# Patient Record
Sex: Female | Born: 1945
Health system: Southern US, Community
[De-identification: ages and names within clinical notes are randomized; demographics above are authoritative.]

## PROBLEM LIST (undated history)

## (undated) DIAGNOSIS — I5181 Takotsubo syndrome: Secondary | ICD-10-CM

## (undated) DIAGNOSIS — R011 Cardiac murmur, unspecified: Secondary | ICD-10-CM

## (undated) DIAGNOSIS — R7401 Elevation of levels of liver transaminase levels: Secondary | ICD-10-CM

## (undated) DIAGNOSIS — N3010 Interstitial cystitis (chronic) without hematuria: Secondary | ICD-10-CM

## (undated) DIAGNOSIS — B029 Zoster without complications: Secondary | ICD-10-CM

## (undated) DIAGNOSIS — R251 Tremor, unspecified: Secondary | ICD-10-CM

## (undated) DIAGNOSIS — M8580 Other specified disorders of bone density and structure, unspecified site: Secondary | ICD-10-CM

## (undated) DIAGNOSIS — K859 Acute pancreatitis without necrosis or infection, unspecified: Secondary | ICD-10-CM

## (undated) DIAGNOSIS — R7303 Prediabetes: Secondary | ICD-10-CM

## (undated) DIAGNOSIS — I959 Hypotension, unspecified: Secondary | ICD-10-CM

## (undated) DIAGNOSIS — I340 Nonrheumatic mitral (valve) insufficiency: Secondary | ICD-10-CM

## (undated) DIAGNOSIS — I4719 Other supraventricular tachycardia: Secondary | ICD-10-CM

## (undated) DIAGNOSIS — I341 Nonrheumatic mitral (valve) prolapse: Secondary | ICD-10-CM

## (undated) DIAGNOSIS — I471 Supraventricular tachycardia: Secondary | ICD-10-CM

## (undated) DIAGNOSIS — M810 Age-related osteoporosis without current pathological fracture: Secondary | ICD-10-CM

## (undated) DIAGNOSIS — R000 Tachycardia, unspecified: Secondary | ICD-10-CM

## (undated) DIAGNOSIS — J439 Emphysema, unspecified: Secondary | ICD-10-CM

## (undated) DIAGNOSIS — R740 Nonspecific elevation of levels of transaminase and lactic acid dehydrogenase [LDH]: Secondary | ICD-10-CM

## (undated) DIAGNOSIS — G43909 Migraine, unspecified, not intractable, without status migrainosus: Secondary | ICD-10-CM

## (undated) DIAGNOSIS — E785 Hyperlipidemia, unspecified: Secondary | ICD-10-CM

## (undated) HISTORY — DX: Hypotension, unspecified: I95.9

## (undated) HISTORY — PX: DILATION AND CURETTAGE OF UTERUS: SHX78

## (undated) HISTORY — DX: Nonrheumatic mitral (valve) prolapse: I34.1

## (undated) HISTORY — DX: Tachycardia, unspecified: R00.0

## (undated) HISTORY — DX: Age-related osteoporosis without current pathological fracture: M81.0

## (undated) HISTORY — DX: Nonrheumatic mitral (valve) insufficiency: I34.0

## (undated) HISTORY — DX: Interstitial cystitis (chronic) without hematuria: N30.10

## (undated) HISTORY — PX: WRIST SURGERY: SHX841

## (undated) HISTORY — PX: TOE SURGERY: SHX1073

## (undated) HISTORY — DX: Other specified disorders of bone density and structure, unspecified site: M85.80

## (undated) HISTORY — DX: Zoster without complications: B02.9

## (undated) HISTORY — PX: ELBOW SURGERY: SHX618

## (undated) HISTORY — DX: Cardiac murmur, unspecified: R01.1

## (undated) HISTORY — PX: TOTAL ABDOMINAL HYSTERECTOMY W/ BILATERAL SALPINGOOPHORECTOMY: SHX83

---

## 1998-10-15 ENCOUNTER — Encounter: Admission: RE | Admit: 1998-10-15 | Discharge: 1998-10-15 | Payer: Self-pay | Admitting: Obstetrics and Gynecology

## 1999-09-19 ENCOUNTER — Other Ambulatory Visit: Admission: RE | Admit: 1999-09-19 | Discharge: 1999-09-19 | Payer: Self-pay | Admitting: Obstetrics and Gynecology

## 1999-10-17 ENCOUNTER — Encounter: Payer: Self-pay | Admitting: Obstetrics and Gynecology

## 1999-10-17 ENCOUNTER — Encounter: Admission: RE | Admit: 1999-10-17 | Discharge: 1999-10-17 | Payer: Self-pay | Admitting: Obstetrics and Gynecology

## 2000-10-18 ENCOUNTER — Encounter: Payer: Self-pay | Admitting: Obstetrics and Gynecology

## 2000-10-18 ENCOUNTER — Encounter: Admission: RE | Admit: 2000-10-18 | Discharge: 2000-10-18 | Payer: Self-pay | Admitting: Obstetrics and Gynecology

## 2001-10-22 ENCOUNTER — Encounter: Admission: RE | Admit: 2001-10-22 | Discharge: 2001-10-22 | Payer: Self-pay | Admitting: Obstetrics and Gynecology

## 2001-10-22 ENCOUNTER — Encounter: Payer: Self-pay | Admitting: Obstetrics and Gynecology

## 2002-05-03 HISTORY — PX: SHOULDER SURGERY: SHX246

## 2002-11-21 ENCOUNTER — Encounter: Admission: RE | Admit: 2002-11-21 | Discharge: 2002-11-21 | Payer: Self-pay | Admitting: Obstetrics and Gynecology

## 2003-12-04 ENCOUNTER — Ambulatory Visit (HOSPITAL_COMMUNITY): Admission: RE | Admit: 2003-12-04 | Discharge: 2003-12-04 | Payer: Self-pay | Admitting: Obstetrics and Gynecology

## 2004-12-05 ENCOUNTER — Ambulatory Visit (HOSPITAL_COMMUNITY): Admission: RE | Admit: 2004-12-05 | Discharge: 2004-12-05 | Payer: Self-pay | Admitting: Obstetrics and Gynecology

## 2005-08-09 ENCOUNTER — Ambulatory Visit (HOSPITAL_COMMUNITY): Admission: RE | Admit: 2005-08-09 | Discharge: 2005-08-09 | Payer: Self-pay | Admitting: Obstetrics & Gynecology

## 2005-12-08 ENCOUNTER — Ambulatory Visit (HOSPITAL_COMMUNITY): Admission: RE | Admit: 2005-12-08 | Discharge: 2005-12-08 | Payer: Self-pay | Admitting: Obstetrics & Gynecology

## 2005-12-15 ENCOUNTER — Encounter: Admission: RE | Admit: 2005-12-15 | Discharge: 2005-12-15 | Payer: Self-pay | Admitting: Obstetrics & Gynecology

## 2006-12-21 ENCOUNTER — Ambulatory Visit (HOSPITAL_COMMUNITY): Admission: RE | Admit: 2006-12-21 | Discharge: 2006-12-21 | Payer: Self-pay | Admitting: Obstetrics & Gynecology

## 2007-12-23 ENCOUNTER — Ambulatory Visit (HOSPITAL_COMMUNITY): Admission: RE | Admit: 2007-12-23 | Discharge: 2007-12-23 | Payer: Self-pay | Admitting: *Deleted

## 2008-08-07 ENCOUNTER — Ambulatory Visit (HOSPITAL_COMMUNITY): Admission: RE | Admit: 2008-08-07 | Discharge: 2008-08-07 | Payer: Self-pay | Admitting: Obstetrics & Gynecology

## 2008-12-24 ENCOUNTER — Ambulatory Visit (HOSPITAL_COMMUNITY): Admission: RE | Admit: 2008-12-24 | Discharge: 2008-12-24 | Payer: Self-pay | Admitting: Obstetrics & Gynecology

## 2009-12-03 ENCOUNTER — Encounter (INDEPENDENT_AMBULATORY_CARE_PROVIDER_SITE_OTHER): Payer: Self-pay | Admitting: Cardiology

## 2009-12-03 ENCOUNTER — Ambulatory Visit (HOSPITAL_COMMUNITY)
Admission: RE | Admit: 2009-12-03 | Discharge: 2009-12-03 | Payer: Self-pay | Source: Home / Self Care | Admitting: Cardiology

## 2009-12-20 ENCOUNTER — Ambulatory Visit: Payer: Self-pay | Admitting: Thoracic Surgery (Cardiothoracic Vascular Surgery)

## 2009-12-28 ENCOUNTER — Ambulatory Visit (HOSPITAL_COMMUNITY)
Admission: RE | Admit: 2009-12-28 | Discharge: 2009-12-28 | Payer: Self-pay | Source: Home / Self Care | Attending: Obstetrics & Gynecology | Admitting: Obstetrics & Gynecology

## 2010-01-02 HISTORY — PX: MITRAL VALVE REPAIR: SHX2039

## 2010-01-17 ENCOUNTER — Ambulatory Visit
Admission: RE | Admit: 2010-01-17 | Discharge: 2010-01-17 | Payer: Self-pay | Source: Home / Self Care | Attending: Thoracic Surgery (Cardiothoracic Vascular Surgery) | Admitting: Thoracic Surgery (Cardiothoracic Vascular Surgery)

## 2010-01-19 ENCOUNTER — Ambulatory Visit
Admission: RE | Admit: 2010-01-19 | Payer: Self-pay | Source: Home / Self Care | Attending: Cardiology | Admitting: Cardiology

## 2010-01-21 NOTE — Procedures (Signed)
NAMESOPHIE, TAMEZ             ACCOUNT NO.:  [id number]  MEDICAL RECORD NO.:  [id number]          PATIENT TYPE:  OIB  LOCATION:  1961                         FACILITY:  [facility]  PHYSICIAN:  Jake Bathe, MD      DATE OF BIRTH:  10/12/1945  DATE OF PROCEDURE:  01/19/2010 DATE OF DISCHARGE:  01/19/2010                           CARDIAC CATHETERIZATION   Cardiac catheterization is being performed via the left groin as a preoperative workup to mitral valve repair due to severe mitral regurgitation.  She has no prior history of CAD.  PROCEDURE DETAILS:  Informed consent was obtained.  Risk of stroke, heart attack, death, renal impairment, arterial damage, bleeding were explained to the patient at length.  A 4-French sheath was inserted into the left femoral artery and a 7-French sheath was inserted to the left femoral vein after 1% lidocaine was used.  She tolerated the procedure well.  A Judkins left #4 and a no-torque Williams right catheter were used to selectively cannulate the coronary arteries and multiple views with hand injection of Omnipaque were obtained.  Angled pigtail was used to cross the left ventricle.  Hemodynamics were obtained in the left ventriculogram in the RAO position utilizing 30 mL of contrast was performed and aortogram utilizing 30 mL of contrast was performed in the descending aorta.  Right heart catheterization was also performed initially via a balloon-tipped Swan catheter and pressures were obtained.  Following the procedure, sheaths were removed and she tolerated the procedure very well.  FINDINGS: 1. Left main artery - patent, branches into the circumflex as well as     LAD.  No angiographically significant coronary artery disease. 2. Left anterior descending artery - two small diagonal vessels.  All     of her coronary arteries are relatively small in diameter and she     is quite short in stature.  No angiographically significant      disease. 3. Circumflex artery - gives rise to two significant obtuse marginal     branches.  No angiographically significant disease. 4. Right coronary artery gives rise to the posterior descending     artery.  No angiographically significant disease is present. 5. Left ventriculogram:  Mitral valve prolapse is noted.  Left     ventricular ejection fraction is normal at 65%.  There does appear     to be more significant basilar contraction than the remainder of     the left ventricle.  However, there are no significant wall motion     abnormalities present.  There is 4+ mitral regurgitation present     where the atrium opacifies within 1-2 beats equally with the left     ventricle.  The ascending aorta appears normal. 6. Aortogram:  The mid to distal descending aorta is quite small in     caliber following the superior mesenteric artery.  There does not     appear to be any renal artery stenosis.  There also does not appear     to be any atherosclerosis of her distal aorta or iliacs.  Just once     again, she is quite small  in caliber.  HEMODYNAMICS:  Aortic pressure 140/66 with a mean of 93, left ventricular pressure 140/65 with an end-diastolic pressure of 19 mmHg. Wedge was 7/3 with a mean of 3.  This recording in retrospect may have been over wedged; however, there is significant V-wave when compared to A-wave.  Pulmonary artery 14/6 with a mean of 10.  Pulmonary artery 19/6 with a mean of 12.  Right ventricle 19/0 with end-diastolic pressure of 4 mmHg.  Right atrial pressure 3/2 with a mean of 2.  Aortic saturation used for calculation was 90.  Pulmonary artery saturation was 69 throughout much of the case; however, she was satting in the upper 90s on pulse oximetry.  Her Fick cardiac output was 4.2 liters per minute. Cardiac index was 2.9.  This may be slightly underestimated due to aortic sat being 90.  IMPRESSION: 1. No angiographically significant coronary artery disease. 2.  Normal ascending descending aorta with a small-caliber vessel     following the renal arteries. 3. Normal left ventricular ejection fraction of 65%. 4. Severe mitral regurgitation 4+. 5. Normal right-sided heart pressures with no evidence of pulmonary     hypertension and accentuated V-wave noted on wedge pressure due to     mitral regurgitation. 6. Mitral valve prolapse noted.  We will proceed with surgery.     Jake Bathe, MD     MCS/MEDQ  D:  01/19/2010  T:  01/19/2010  Job:  [id number]  cc:   Salvatore Decent. Cornelius Moras, M.D.  Electronically Signed by Donato Schultz MD on 01/21/2010 07:07:42 AM

## 2010-01-23 ENCOUNTER — Encounter: Payer: Self-pay | Admitting: Obstetrics & Gynecology

## 2010-01-24 LAB — CBC
HCT: 42.5 % (ref 36.0–46.0)
Hemoglobin: 13.9 g/dL (ref 12.0–15.0)
MCH: 28.1 pg (ref 26.0–34.0)
MCHC: 32.7 g/dL (ref 30.0–36.0)
MCV: 86 fL (ref 78.0–100.0)
Platelets: 259 10*3/uL (ref 150–400)
RBC: 4.94 MIL/uL (ref 3.87–5.11)
WBC: 5.1 10*3/uL (ref 4.0–10.5)

## 2010-01-24 LAB — COMPREHENSIVE METABOLIC PANEL
ALT: 23 U/L (ref 0–35)
AST: 27 U/L (ref 0–37)
Albumin: 4.2 g/dL (ref 3.5–5.2)
Alkaline Phosphatase: 78 U/L (ref 39–117)
CO2: 21 mEq/L (ref 19–32)
Calcium: 10 mg/dL (ref 8.4–10.5)
Chloride: 107 mEq/L (ref 96–112)
Creatinine, Ser: 1.04 mg/dL (ref 0.4–1.2)
GFR calc Af Amer: >60 mL/min (ref >60)
GFR calc non Af Amer: 54 mL/min — ABNORMAL LOW (ref >60)
Glucose, Bld: 93 mg/dL (ref 70–99)
Potassium: 4.7 mEq/L (ref 3.5–5.1)
Sodium: 140 mEq/L (ref 135–145)
Total Bilirubin: 0.3 mg/dL (ref 0.3–1.2)
Total Protein: 6.8 g/dL (ref 6.0–8.3)

## 2010-01-24 LAB — BLOOD GAS, ARTERIAL
Bicarbonate: 23.5 mEq/L (ref 20.0–24.0)
Drawn by: 181601
FIO2: 0.21 %
Patient temperature: 98.6
TCO2: 24.4 mmol/L (ref 0–100)
pCO2 arterial: 28.9 mmHg — ABNORMAL LOW (ref 35.0–45.0)
pH, Arterial: 7.522 — ABNORMAL HIGH (ref 7.350–7.400)

## 2010-01-24 LAB — HEMOGLOBIN A1C
Hgb A1c MFr Bld: 5.6 % (ref <5.7)
Mean Plasma Glucose: 114 mg/dL (ref <117)

## 2010-01-24 LAB — POCT I-STAT 3, ART BLOOD GAS (G3+)
Acid-Base Excess: 2 mmol/L (ref 0.0–2.0)
Bicarbonate: 26.9 mEq/L — ABNORMAL HIGH (ref 20.0–24.0)
O2 Saturation: 90 %
TCO2: 28 mmol/L (ref 0–100)
pCO2 arterial: 44.1 mmHg (ref 35.0–45.0)
pO2, Arterial: 59 mmHg — ABNORMAL LOW (ref 80.0–100.0)

## 2010-01-24 LAB — SURGICAL PCR SCREEN
MRSA, PCR: NEGATIVE
Staphylococcus aureus: NEGATIVE

## 2010-01-24 LAB — POCT I-STAT 3, VENOUS BLOOD GAS (G3P V)
Bicarbonate: 26 mEq/L — ABNORMAL HIGH (ref 20.0–24.0)
O2 Saturation: 72 %
TCO2: 27 mmol/L (ref 0–100)
pH, Ven: 7.369 — ABNORMAL HIGH (ref 7.250–7.300)
pO2, Ven: 39 mmHg (ref 30.0–45.0)

## 2010-01-24 LAB — APTT: aPTT: 26 seconds (ref 24–37)

## 2010-01-24 LAB — URINALYSIS, ROUTINE W REFLEX MICROSCOPIC
Protein, ur: NEGATIVE mg/dL
Specific Gravity, Urine: 1.023 (ref 1.005–1.030)
Urine Glucose, Fasting: NEGATIVE mg/dL
Urobilinogen, UA: 0.2 mg/dL (ref 0.0–1.0)
pH: 8 (ref 5.0–8.0)

## 2010-01-24 LAB — PROTIME-INR
INR: 0.93 (ref 0.00–1.49)
Prothrombin Time: 12.7 seconds (ref 11.6–15.2)

## 2010-01-25 ENCOUNTER — Inpatient Hospital Stay (HOSPITAL_COMMUNITY)
Admission: RE | Admit: 2010-01-25 | Discharge: 2010-02-02 | DRG: 545 | Disposition: A | Discharge status: Home Health | Payer: BC Managed Care – PPO | Attending: Thoracic Surgery (Cardiothoracic Vascular Surgery) | Admitting: Thoracic Surgery (Cardiothoracic Vascular Surgery)

## 2010-01-25 ENCOUNTER — Encounter: Payer: Self-pay | Admitting: Thoracic Surgery (Cardiothoracic Vascular Surgery)

## 2010-01-25 DIAGNOSIS — Z79899 Other long term (current) drug therapy: Secondary | ICD-10-CM

## 2010-01-25 DIAGNOSIS — I059 Rheumatic mitral valve disease, unspecified: Principal | ICD-10-CM | POA: Diagnosis present

## 2010-01-25 DIAGNOSIS — E8779 Other fluid overload: Secondary | ICD-10-CM | POA: Diagnosis not present

## 2010-01-25 DIAGNOSIS — D62 Acute posthemorrhagic anemia: Secondary | ICD-10-CM | POA: Diagnosis not present

## 2010-01-25 DIAGNOSIS — I959 Hypotension, unspecified: Secondary | ICD-10-CM | POA: Diagnosis not present

## 2010-01-25 DIAGNOSIS — E875 Hyperkalemia: Secondary | ICD-10-CM | POA: Diagnosis not present

## 2010-01-25 DIAGNOSIS — D72829 Elevated white blood cell count, unspecified: Secondary | ICD-10-CM | POA: Diagnosis not present

## 2010-01-25 DIAGNOSIS — K219 Gastro-esophageal reflux disease without esophagitis: Secondary | ICD-10-CM | POA: Diagnosis not present

## 2010-01-25 DIAGNOSIS — D696 Thrombocytopenia, unspecified: Secondary | ICD-10-CM | POA: Diagnosis not present

## 2010-01-25 DIAGNOSIS — K859 Acute pancreatitis without necrosis or infection, unspecified: Secondary | ICD-10-CM | POA: Diagnosis not present

## 2010-01-26 LAB — CBC
HCT: 31.2 % — ABNORMAL LOW (ref 36.0–46.0)
HCT: 25.1 % — ABNORMAL LOW (ref 36.0–46.0)
HCT: 26.4 % — ABNORMAL LOW (ref 36.0–46.0)
Hemoglobin: 8.5 g/dL — ABNORMAL LOW (ref 12.0–15.0)
Hemoglobin: 8.5 g/dL — ABNORMAL LOW (ref 12.0–15.0)
Hemoglobin: 8.8 g/dL — ABNORMAL LOW (ref 12.0–15.0)
MCH: 28.7 pg (ref 26.0–34.0)
MCH: 28.7 pg (ref 26.0–34.0)
MCHC: 34 g/dL (ref 30.0–36.0)
MCHC: 33.3 g/dL (ref 30.0–36.0)
MCHC: 33.9 g/dL (ref 30.0–36.0)
MCHC: 33.3 g/dL (ref 30.0–36.0)
MCV: 84.6 fL (ref 78.0–100.0)
MCV: 86.1 fL (ref 78.0–100.0)
MCV: 86.8 fL (ref 78.0–100.0)
RBC: 2.9 MIL/uL — ABNORMAL LOW (ref 3.87–5.11)
RDW: 13.4 % (ref 11.5–15.5)
RDW: 14.1 % (ref 11.5–15.5)

## 2010-01-26 LAB — HEMOGLOBIN AND HEMATOCRIT, BLOOD
HCT: 23.7 % — ABNORMAL LOW (ref 36.0–46.0)
Hemoglobin: 8 g/dL — ABNORMAL LOW (ref 12.0–15.0)

## 2010-01-26 LAB — POCT I-STAT 4, (NA,K, GLUC, HGB,HCT)
Glucose, Bld: 99 mg/dL (ref 70–99)
Glucose, Bld: 81 mg/dL (ref 70–99)
Glucose, Bld: 104 mg/dL — ABNORMAL HIGH (ref 70–99)
Glucose, Bld: 107 mg/dL — ABNORMAL HIGH (ref 70–99)
HCT: 37 % (ref 36.0–46.0)
HCT: 33 % — ABNORMAL LOW (ref 36.0–46.0)
HCT: 22 % — ABNORMAL LOW (ref 36.0–46.0)
HCT: 31 % — ABNORMAL LOW (ref 36.0–46.0)
Hemoglobin: 8.5 g/dL — ABNORMAL LOW (ref 12.0–15.0)
Hemoglobin: 6.8 g/dL — CL (ref 12.0–15.0)
Hemoglobin: 10.5 g/dL — ABNORMAL LOW (ref 12.0–15.0)
Potassium: 4.1 mEq/L (ref 3.5–5.1)
Potassium: 4.1 mEq/L (ref 3.5–5.1)
Sodium: 139 mEq/L (ref 135–145)
Sodium: 131 mEq/L — ABNORMAL LOW (ref 135–145)
Sodium: 141 mEq/L (ref 135–145)

## 2010-01-26 LAB — GLUCOSE, CAPILLARY
Glucose-Capillary: 102 mg/dL — ABNORMAL HIGH (ref 70–99)
Glucose-Capillary: 121 mg/dL — ABNORMAL HIGH (ref 70–99)
Glucose-Capillary: 91 mg/dL (ref 70–99)
Glucose-Capillary: 109 mg/dL — ABNORMAL HIGH (ref 70–99)
Glucose-Capillary: 133 mg/dL — ABNORMAL HIGH (ref 70–99)
Glucose-Capillary: 145 mg/dL — ABNORMAL HIGH (ref 70–99)

## 2010-01-26 LAB — POCT I-STAT 3, ART BLOOD GAS (G3+)
Acid-base deficit: 1 mmol/L (ref 0.0–2.0)
Acid-base deficit: 4 mmol/L — ABNORMAL HIGH (ref 0.0–2.0)
Bicarbonate: 21.8 mEq/L (ref 20.0–24.0)
Bicarbonate: 20.1 mEq/L (ref 20.0–24.0)
O2 Saturation: 100 %
O2 Saturation: 99 %
O2 Saturation: 100 %
Patient temperature: 37
TCO2: 23 mmol/L (ref 0–100)
TCO2: 23 mmol/L (ref 0–100)
TCO2: 21 mmol/L (ref 0–100)
pH, Arterial: 7.428 — ABNORMAL HIGH (ref 7.350–7.400)
pO2, Arterial: 142 mmHg — ABNORMAL HIGH (ref 80.0–100.0)

## 2010-01-26 LAB — PLATELET COUNT: Platelets: 142 10*3/uL — ABNORMAL LOW (ref 150–400)

## 2010-01-26 LAB — POCT I-STAT, CHEM 8
BUN: 6 mg/dL (ref 6–23)
Creatinine, Ser: 0.7 mg/dL (ref 0.4–1.2)
Glucose, Bld: 150 mg/dL — ABNORMAL HIGH (ref 70–99)
Hemoglobin: 8.2 g/dL — ABNORMAL LOW (ref 12.0–15.0)
TCO2: 21 mmol/L (ref 0–100)

## 2010-01-26 LAB — BASIC METABOLIC PANEL
Calcium: 7 mg/dL — ABNORMAL LOW (ref 8.4–10.5)
GFR calc Af Amer: >60 mL/min (ref >60)
GFR calc non Af Amer: >60 mL/min (ref >60)
Sodium: 138 mEq/L (ref 135–145)

## 2010-01-26 LAB — PROTIME-INR: INR: 1.44 (ref 0.00–1.49)

## 2010-01-26 LAB — GLUCOSE, POCT (MANUAL RESULT ENTRY)
Glucose, Bld: 96 mg/dL (ref 70–99)
Operator id: 3408

## 2010-01-26 LAB — CREATININE, SERUM
GFR calc Af Amer: >60 mL/min (ref >60)
GFR calc non Af Amer: >60 mL/min (ref >60)

## 2010-01-26 LAB — MAGNESIUM: Magnesium: 3.8 mg/dL — ABNORMAL HIGH (ref 1.5–2.5)

## 2010-01-27 LAB — BASIC METABOLIC PANEL
BUN: 16 mg/dL (ref 6–23)
Calcium: 7.6 mg/dL — ABNORMAL LOW (ref 8.4–10.5)
GFR calc non Af Amer: 52 mL/min — ABNORMAL LOW (ref >60)
Glucose, Bld: 142 mg/dL — ABNORMAL HIGH (ref 70–99)
Potassium: 5.3 mEq/L — ABNORMAL HIGH (ref 3.5–5.1)

## 2010-01-27 LAB — PROTIME-INR
INR: 1.92 — ABNORMAL HIGH (ref 0.00–1.49)
Prothrombin Time: 22.1 seconds — ABNORMAL HIGH (ref 11.6–15.2)

## 2010-01-27 LAB — CBC
Hemoglobin: 9.2 g/dL — ABNORMAL LOW (ref 12.0–15.0)
MCHC: 32.5 g/dL (ref 30.0–36.0)
Platelets: 100 10*3/uL — ABNORMAL LOW (ref 150–400)
RBC: 3.22 MIL/uL — ABNORMAL LOW (ref 3.87–5.11)

## 2010-01-28 LAB — CBC
HCT: 25.5 % — ABNORMAL LOW (ref 36.0–46.0)
MCHC: 33.7 g/dL (ref 30.0–36.0)
Platelets: 97 10*3/uL — ABNORMAL LOW (ref 150–400)
RDW: 13.9 % (ref 11.5–15.5)
WBC: 13.7 10*3/uL — ABNORMAL HIGH (ref 4.0–10.5)

## 2010-01-28 LAB — BASIC METABOLIC PANEL
BUN: 23 mg/dL (ref 6–23)
Calcium: 8.3 mg/dL — ABNORMAL LOW (ref 8.4–10.5)
Creatinine, Ser: 1.01 mg/dL (ref 0.4–1.2)
GFR calc Af Amer: >60 mL/min (ref >60)
GFR calc non Af Amer: 56 mL/min — ABNORMAL LOW (ref >60)

## 2010-01-28 LAB — PROTIME-INR
INR: 2.36 — ABNORMAL HIGH (ref 0.00–1.49)
Prothrombin Time: 25.9 seconds — ABNORMAL HIGH (ref 11.6–15.2)

## 2010-01-28 NOTE — Op Note (Signed)
Helen Swanson, Helen Swanson             ACCOUNT NO.:  [id number]  MEDICAL RECORD NO.:  [id number]          PATIENT TYPE:  INP  LOCATION:  2307                         FACILITY:  [facility]  PHYSICIAN:  Salvatore Decent. Cornelius Moras, M.D. DATE OF BIRTH:  1945/08/12  DATE OF PROCEDURE:  01/25/2010 DATE OF DISCHARGE:                              OPERATIVE REPORT   PREOPERATIVE DIAGNOSIS:  Mitral valve prolapse with severe mitral regurgitation.  POSTOPERATIVE DIAGNOSIS:  Mitral valve prolapse with severe mitral regurgitation.  PROCEDURE:  Right miniature thoracotomy for mitral valve repair (complex valvuloplasty including quadrangular resection of posterior leaflet with sliding leaflet annuloplasty and 32-mm Sorin Memo 3D ring annuloplasty).  SURGEON:  Salvatore Decent. Cornelius Moras, MD.  ASSISTANT:  Doree Fudge, PA  ANESTHESIA:  Burna Forts, MD.  BRIEF CLINICAL NOTE:  The patient is a 65 year old female recently discovered to have a heart murmur on routine physical exam by her primary care physician, Dr. Corrie Mckusick.  The patient was referred to Dr. Donato Schultz and underwent 2-D echocardiogram demonstrating mitral valve prolapse with moderate-to-severe mitral regurgitation.  There is normal left ventricular function.  Transesophageal echocardiogram was performed confirming the presence of bileaflet prolapse of the mitral valve with severe mitral regurgitation.  Again, there was normal left ventricular size and function.  The patient claimed to be relatively asymptomatic.  She subsequently underwent a routine exercise stress test, but could only exercise up to 3 minutes on a standard Bruce protocol due to shortness of breath and fatigue.  Elective cardiac catheterization was performed demonstrating normal coronary artery anatomy with no significant coronary artery disease.  Catheterization also confirmed the presence of severe mitral regurgitation.  Large V- waves were seen on right heart  catheterization and injection of the left ventricle revealed rapid filling of the left atrium.  The patient has been counseled at length regarding the indications, risks, and potential benefits of surgical intervention for treatment of mitral regurgitation.  Alternative treatment strategies and surgical approaches have been discussed.  The patient understands and accepts all associated risks and desires to proceed as described.  OPERATIVE FINDINGS: 1. 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 2. Normal left ventricular systolic function. 3. No residual mitral regurgitation following successful mitral valve     repair. 4. Very small caliber right femoral artery requiring saphenous vein     patch angioplasty after decannulation at the end of surgery.  OPERATIVE NOTE IN DETAIL:  The patient was brought to the operating room on the above-mentioned date and central monitoring was established by the anesthesia team under the care and direction of Dr. Ernie Avena.  Specifically, a radial arterial line was placed.  A Cordis introducing sheath was placed by Dr. Jacklynn Bue in the left subclavian position.  Attempts at floating a Swan-Ganz pulmonary artery catheter were unsuccessful in the preoperative holding area and aborted.  The patient developed pain with attempts to pass a Swan-Ganz catheter.  The patient is brought to the operating room and placed in the supine position on the operating table.  General endotracheal anesthesia was induced uneventfully.  The patient  is initially intubated with a dual- lumen endotracheal tube.  A Foley catheter was placed.  After induction of general anesthesia, a Swan-Ganz catheter will easily float into the pulmonary artery without any difficulty or resistance.  The patient was placed in the supine position with a soft roll behind the right scapula and the neck gently extended and turned  towards the left.  The patient's right neck, chest, abdomen, both groins, and both lower extremities were prepared and draped in a sterile manner.  Baseline transesophageal echocardiogram was performed by Dr. Jacklynn Bue. This confirmed the presence of billowing bileaflet prolapse of the mitral valve with moderate-to-severe mitral regurgitation.  There is normal left ventricular systolic function.  No other significant abnormalities are noted.  A small incision was made in the right inguinal crease and the anterior surface of the right common femoral artery and the right common femoral vein were dissected through this incision.  The femoral artery is small caliber but otherwise normal in appearance and free of any atherosclerotic disease.  Single lung ventilation was begun.  A right miniature anterolateral thoracotomy incision was performed lateral to the right nipple and breast.  The pectoralis major muscle was retracted medially and completely preserved.  The right pleural space was entered through the third intercostal space.  There are few adhesions between the lung and the dome of the right hemidiaphragm which are divided with electrocautery.  A soft tissue retractor was placed.  Two 11-mm ports were placed through separate stab incisions in the inframammary crease. The right pleural space is insufflated continuously with carbon dioxide gas through the posterior port.  A pledgeted suture was placed in the dome of the right hemidiaphragm and retracted inferiorly to facilitate exposure.  A longitudinal incision was made in the pericardium 3 cm anterior to the phrenic nerve.  Of note, there is a moderate-to-large size hematoma appreciated in the superior anterior mediastinum surrounding the superior vena cava.  This was present and noted at the time of entry into the chest and is clearly fresh thrombus, presumably related to microperforation of the superior vena cava or a  small accessory vein that must have occurred at the time of initial left subclavian line placement and attempted passage of the Swan-Ganz catheter.  There is no sign of any active bleeding.  The patient is heparinized systemically.  Two concentric pursestring sutures are placed on the anterior surface of the right femoral artery. A pursestring suture was placed on the anterior surface of the right common femoral vein.  The right common femoral vein is cannulated with a Seldinger technique through the pursestring suture and a flexible guidewire was advanced up through the inferior vena cava through the right atrium into the superior vena cava using transesophageal echocardiogram for guidance.  The femoral vein was dilated with serial dilators and a 22-French long femoral venous cannula was advanced over the guidewire up through the right atrium until the tip of the cannula extends into the superior vena cava.  The femoral artery is now cannulated with a Seldinger technique and a flexible guidewire was advanced up until it can be visualized intraluminally in the descending thoracic aorta by transesophageal echocardiogram.  The femoral artery was dilated with serial dilators and cannulated with a 16-French femoral arterial cannula.  The right internal jugular vein was cannulated with Seldinger technique and a guidewire advanced into the right atrium.  The internal jugular vein was dilated with serial dilators and subsequent cannulated with a 14-French pediatric femoral venous cannula.  Cardiopulmonary bypass was begun.  Vacuum assist venous drainage was utilized.  Venous drainage and exposure are notably excellent.  A longitudinal incision in the pericardium was extended in both directions.  Silk traction sutures are placed on either side to facilitate exposure.  An antegrade cardioplegic cannula was placed directly in the proximal right anterolateral surface of the ascending thoracic  aorta.  A retrograde cardioplegic cannula is placed through the right atrium into the coronary sinus using transesophageal echocardiogram for guidance.  The patient is cooled to 28 degrees systemic temperature.  The aortic crossclamp was applied and cold blood cardioplegia is delivered initially in antegrade fashion through the aortic root.  The initial cardioplegic arrest was rapid with early diastolic arrest.  Supplemental cardioplegia was administered retrograde through the coronary sinus catheter.  Repeat doses of cardioplegia are administered intermittently throughout the entire crossclamp portion of the operation every 20 minutes to maintain completely flat electrocardiogram.  Myocardial protection was felt to be excellent.  A left atriotomy incision was performed posteriorly through the interatrial groove and continue partway across the back wall of the left atrium after opening the oblique sinus inferiorly.  A retractor blade was placed into the left atrium and attached to a sidearm placed through a small stab incision just to the right side of the sternum through the third intercostal space.  Exposure of the floor of the left atrium and the mitral valve were notably excellent.  The mitral valve was now carefully examined.  There was billowing bileaflet prolapse of valve with redundant excessive and somewhat thickened leaflet tissue, all consistent with Barlow syndrome.  There are no areas of ruptured chords or flail segments, but simply diffuse elongation.  The posterior leaflet was particularly tall in the midline and redundant.  There is some calcification beneath the P1 segment of the posterior leaflet, although this is not causing too much restriction at this level.  Valve repair is felt to be optimal.  Interrupted 2-0 Ethibond horizontal mattress sutures are placed circumferentially around the entire mitral valve annulus.  These sutures will ultimately be utilized for  ring annuloplasty; and at this juncture, they are utilized to suspend the valve symmetrically to facilitate further repair.  Because of the excessive height of the posterior leaflet with billowing and excessive tissue, quadrangular resection with sliding leaflet annuloplasty was felt that likely be the best choice for optimal long-term result.  The posterior leaflet was mobilized off of the posterior mitral annulus using an 11 blade knife.  A small quadrangular resection in the tallest portion of the middle scallop (P2) of the posterior leaflet is now resected.  Mobilizing the P1 segment of posterior leaflet off of the annulus is somewhat tricky due to the underlying calcification, but ultimately this was adequately freed up and the sliding plasty is continued well beyond the area of calcification.  There is adequate leaflet tissue in this region without need to patch augment as the calcium does not extend into the leaflet itself.  The sliding leaflet reconstruction was performed using a running closure of CV-5 Gore-Tex suture.  The remaining defect in the middle of the valve was also closed with CV-5 Gore-Tex.  After completion of the quadrangular resection with sliding leaflet plasty, the valve appears to be competent.  Of note, the vertical height of the middle portion of the posterior leaflet has now been reduced to less than 15 mm in height.  The valve was sized to accept a 32-mm annuloplasty ring which is slightly larger than  the dimensions of the anterior leaflet.  A Sorin Memo 3D annuloplasty ring (size 32 mm, catalog number [id number], serial number [id number]) is secured in place uneventfully.  After completion of the valve repair, the valve was again tested with saline and appears to be perfectly competent.  There is a broad symmetrical line of coaptation of the anterior and posterior leaflets.  The posterior leaflet height has been reduced nicely and is well below 15 mm in total  height.  Rewarming has begun.  A sump drain was placed across the mitral valve to serve as a left ventricular vent.  The left atriotomy incision was closed using a two- layer closure of running 3-0 Prolene suture.  One final dose of warm retrograde hot shot cardioplegia is administered.  The aortic crossclamp was removed after total crossclamp time of 125 minutes.  The heart began to beat spontaneously without need for cardioversion.  The retrograde cannula was removed.  Epicardial pacing wire was fixed to the undersurface of the right ventricular free wall into the right atrial appendage.  The patient rewarmed to 37 degrees centigrade temperature. The lungs were ventilated and the heart was allowed to fill after which time the left ventricular appendage removed.  The lungs were again ventilated and the heart allowed to fill and echocardiogram was briefly examined.  The valve repair appears to be intact.  At this juncture, the antegrade cannula was removed.  The patient is weaned from cardiopulmonary bypass without difficulty. The patient's rhythm at separation from bypass is a very slow junctional escape rhythm.  AV sequential pacing was employed.  Total cardiopulmonary bypass time for the operation is 179 minutes.  No inotropic support is required.  Followup transesophageal echocardiogram performed by Dr. Jacklynn Bue after separation from bypass demonstrates a well-seated annuloplasty ring in the mitral position.  The mitral valve is functioning normally with no residual mitral valve prolapse and no residual mitral regurgitation, whatsoever.  There is no sign of any systolic anterior motion of the mitral valve and there is a broad line of coaptation of the anterior- posterior leaflets below the annulus.  Left ventricular function is normal.  No other abnormalities are noted.  Protamine was administered to reverse the anticoagulation.  The femoral venous and arterial cannulae are  removed.  The right internal jugular cannula was removed and manual pressure was held on the neck for 13 minutes.  After tying the sutures on the femoral artery, the pulse in the distal femoral artery seems weak.  The femoral artery is notably quite small in caliber, and it is concerning that the pursestring closure of the anterior surface of the femoral artery may have caused stenosis at this level due to pursestring effect in a very small caliber vessel.  The patient was given 2000 units of intravenous heparin.  Proximal and distal control of the femoral artery is ascertained.  A portion of the adjacent greater saphenous vein is removed and trimmed into an elliptical patch.  The closure of the artery is now reopened and a longitudinal incision in the artery is made.  The artery is re-closed with patch angioplasty using saphenous vein with running 6-0 Prolene suture.  After removal of the clamps, the pulse in the distal femoral artery is noticeably improved.  Single lung ventilation was begun.  Meticulous surgical hemostasis is ascertained.  The pericardial sac is drained with a 28-French Bard drain placed through the anterior port incision.  The pericardium was closed with a patch of CorMatrix.  The right  pleural space is irrigated with saline solution and inspected for hemostasis.  The On-Q continuous pain management system is utilized to facilitate postoperative pain control. One 5-inch catheter supplied with the On-Q kit was tunneled initially to accept subcutaneous tissues through the posterior port incision and then tunneled through the intercostal space into the subpleural space posteriorly to cover the third through the sixth intercostal nerve roots.  The catheter was flushed with 5 mL of 0.5% bupivacaine solution and ultimately connected to continuous infusion pump.  The right pleural space was drained with a 28-French Bard chest tube placed through the posterior port  incision.  The mini thoracotomy was closed in multiple layers in routine fashion.  The right groin incision inspected for hemostasis, closed in multiple layers in routine fashion.  All skin incisions are closed with subcuticular skin closure.  Chest tubes were fixed to a closed suction drainage device.  The patient tolerated the procedure well.  The patient is reintubated with a single- lumen endotracheal tube and then transported to surgical intensive care unit in stable condition.  There are no intraoperative complications. All sponge, instrument, and needle counts were verified and correct at completion of the operation.  No blood products were administered.     Salvatore Decent. Cornelius Moras, M.D.     CHO/MEDQ  D:  01/25/2010  T:  01/26/2010  Job:  [id number]  cc:   Jake Bathe, MD Corrie Mckusick, M.D.  Electronically Signed by Tressie Stalker M.D. on 01/26/2010 08:08:02 AM

## 2010-01-29 LAB — TYPE AND SCREEN
ABO/RH(D): B POS
Antibody Screen: NEGATIVE
Unit division: 0
Unit division: 0

## 2010-01-29 LAB — COMPREHENSIVE METABOLIC PANEL
AST: 70 U/L — ABNORMAL HIGH (ref 0–37)
Albumin: 3.1 g/dL — ABNORMAL LOW (ref 3.5–5.2)
BUN: 17 mg/dL (ref 6–23)
Calcium: 8.4 mg/dL (ref 8.4–10.5)
Chloride: 99 mEq/L (ref 96–112)
Creatinine, Ser: 0.81 mg/dL (ref 0.4–1.2)
GFR calc Af Amer: >60 mL/min (ref >60)
GFR calc non Af Amer: >60 mL/min (ref >60)
Total Bilirubin: 0.3 mg/dL (ref 0.3–1.2)

## 2010-01-29 LAB — CBC
MCV: 85.5 fL (ref 78.0–100.0)
Platelets: 118 10*3/uL — ABNORMAL LOW (ref 150–400)
RBC: 3.1 MIL/uL — ABNORMAL LOW (ref 3.87–5.11)
RDW: 14.1 % (ref 11.5–15.5)
WBC: 13.2 10*3/uL — ABNORMAL HIGH (ref 4.0–10.5)

## 2010-01-29 LAB — AMYLASE: Amylase: 157 U/L — ABNORMAL HIGH (ref 0–105)

## 2010-01-29 LAB — PROTIME-INR: Prothrombin Time: 18.5 seconds — ABNORMAL HIGH (ref 11.6–15.2)

## 2010-01-30 LAB — PROTIME-INR
INR: 1.33 (ref 0.00–1.49)
Prothrombin Time: 16.7 seconds — ABNORMAL HIGH (ref 11.6–15.2)

## 2010-01-31 LAB — COMPREHENSIVE METABOLIC PANEL
BUN: 9 mg/dL (ref 6–23)
CO2: 26 mEq/L (ref 19–32)
Calcium: 8.5 mg/dL (ref 8.4–10.5)
Chloride: 102 mEq/L (ref 96–112)
Creatinine, Ser: 0.74 mg/dL (ref 0.4–1.2)
GFR calc Af Amer: >60 mL/min (ref >60)
GFR calc non Af Amer: >60 mL/min (ref >60)
Total Bilirubin: 0.5 mg/dL (ref 0.3–1.2)

## 2010-01-31 LAB — PROTIME-INR: Prothrombin Time: 18.1 seconds — ABNORMAL HIGH (ref 11.6–15.2)

## 2010-01-31 LAB — AMYLASE: Amylase: 165 U/L — ABNORMAL HIGH (ref 0–105)

## 2010-02-01 LAB — PROTIME-INR: INR: 1.48 (ref 0.00–1.49)

## 2010-02-01 LAB — AMYLASE: Amylase: 162 U/L — ABNORMAL HIGH (ref 0–105)

## 2010-02-02 LAB — PROTIME-INR
INR: 1.44 (ref 0.00–1.49)
Prothrombin Time: 17.7 seconds — ABNORMAL HIGH (ref 11.6–15.2)

## 2010-02-03 NOTE — Discharge Summary (Signed)
Helen Swanson, Helen Swanson             ACCOUNT NO.:  [id number]  MEDICAL RECORD NO.:  [id number]          PATIENT TYPE:  INP  LOCATION:  2027                         FACILITY:  [facility]  PHYSICIAN:  Salvatore Decent. Cornelius Moras, M.D. DATE OF BIRTH:  1945/09/06  DATE OF ADMISSION:  01/25/2010 DATE OF DISCHARGE:                              DISCHARGE SUMMARY   ADMITTING DIAGNOSES: 1. Severe mitral regurgitation. 2. History of recurrent urinary tract infections.  DISCHARGE DIAGNOSES: 1. Severe mitral regurgitation. 2. History of recurrent urinary tract infections. 3. Mild pancreatitis.  PROCEDURES: 1. Right miniature thoracotomy for mitral valve repair (complex     valvuloplasty including quadrangular resection of posterior leaflet     with sliding leaflet annuloplasty utilizing a 32-mm Sorin Memo 3D     ring annuloplasty by Dr. Cornelius Moras on January 25, 2010). 2. Intraoperative TEE done by Dr. Luiz Blare on January 26, 2010.  HISTORY OF PRESENT ILLNESS:  This is a 65 year old Caucasian female who was initially seen and evaluated by Dr. Cornelius Moras in the office on December 20, 2009 for severe mitral regurgitation.  According to medical records, the patient was found to have a heart murmur on routine physical exam by her primary care physician, Dr. Leda Quail.  She was referred to Dr. Anne Fu for further evaluation.  She underwent a 2-D echocardiogram on November 11, 2009.  Results revealed the presence of normal left ventricular size and function, bileaflet prolapse of the mitral valve with moderate-to-severe MR, mild TR, left ventricular function was hyperdynamic with an EF of 65-70%, and no other abnormalities were noted.  The patient then underwent a transesophageal echocardiogram by Dr. Anne Fu on December 03, 2009.  Again, this confirmed the presence of severe MR with bileaflet prolapse of the mitral valve, normal left ventricular size and function and again no other abnormalities noted. She then  underwent an exercise stress test.  Apparently, she is only exercise up to 3 minutes of steroid protocol and then had to stop secondary to fatigue, shortness of breath, and tightness in her legs. She was then seen and evaluated by Dr. Cornelius Moras for consideration of a right mini-thoracotomy for mitral valve repair versus replacement.  However, prior to undergoing this aforementioned surgery, a cardiac catheterization needed to be obtained.  This was done by Dr. Anne Fu on January 19, 2010.  Results showed no significant coronary artery disease, normal ascending and descending aorta, left ventricular ejection fraction 55%, severe MR 4+ (as well as mitral valve prolapse). Potential risks, complications and benefits of the surgery were discussed at length with the patient.  She ultimately agreed to proceed. She was admitted to [facility] on January 25, 2010, to undergo the aforementioned right miniature thoracotomy for mitral valve repair by Dr. Cornelius Moras.  BRIEF HOSPITAL COURSE STAY:  The patient was extubated without difficulty early the evening of surgery.  She initially was AAI paced, she had a low-grade fever of 99.9, but later became afebrile and her vital signs were stable.  S[place]-G[place] A-line, chest tube and Foley were all removed early in her postoperative course.  The patient was surgically stable for discharge from the Intensive Care Unit to [facility]  for further convalescence on January 26, 2010.  She was started on a low- dose beta-blocker.  She was found to have acute blood loss anemia.  Her H and H went as low as 8.6 and 25.5.  She did not require a postoperative transfusion.  Her last H and H was 8.9 and 26.5 respectively.  She was started on low-dose Coumadin and her PT and INR were monitored carefully.  She was found to be volume overloaded and diuresed accordingly.  She did have some hypotension on postoperative day #2, her Lopressor was held.  Also, she began to have intermittent nausea.   As a result, all her p.o. meds were stopped with the exception of Protonix.  In addition, her systolic blood pressure did improve and Lopressor was resumed.  Her pacing wires were removed on postop day #3. Amylase was checked, which was found to be 157 initially, it did go up to 165; however, her last amylase was down to 162.  Her Protonix was increased from 40 mg p.o. once a day to twice a day for apparently worsening reflux.  She was also started on Reglan.  It should be noted the patient did not have any abdominal pain and was having bowel movement.  She did have an abdominal ultrasound on January 31, 2010, which essentially showed no gallbladder wall thickening, no pericholecystic fluid, no sludge, and no gallstones.  However, there was an area of echotexture in the uncinate process and posterior aspect of the head of the pancreas that was slightly hypoechoic compared to the rest of the pancreatic parenchyma.  There was no dilatation of the pancreatic duct, there was no pseudocyst or pancreatic enlargement seen. However, radiologist did recommend a CAT scan of the abdomen to further evaluate the pancreas.  This has been ordered and results are currently pending.  The patient's nausea did eventually resolve; however, she did still have some complaints of some indigestion; however, her appetite is steadily improving.  Currently on postop day #7, she is afebrile, heart rates in the 90s, blood pressure 141/80, O2 sat 96-97% on room air. Preop weight is 46 kg, today's weight down to 45.9 kg.  PHYSICAL EXAMINATION:  CARDIOVASCULAR:  Regular rate and rhythm.  No murmur. PULMONARY:  Clear. ABDOMEN:  Soft, nontender.  Bowel sounds present. EXTREMITIES:  No lower extremity edema. WOUND CARE:  Right chest wound is clean, dry and well healed.  Provided the patient's amylase is decreased, she continues to tolerate her diet without any more episodes of nausea and pending morning round of  evaluation, she will be surgically stable for discharge on February 02, 2010.  Latest laboratory results are as follows:  Amylase done on February 01, 2010 162.  PT and INR 18.1 and 1.4 respectively.  CMP done on January 31, 2010, potassium 4, sodium 138, BUN and creatinine 9 and 0.74 respectively.  AST 49, ALT 89.  Last chest x-ray done on January 30, 2010, showed a tiny right apical pneumothorax, small bilateral pleural effusions, stable cardiomegaly.  Discharge instructions include the following:  DIET:  Low-sodium, heart-healthy.  ACTIVITY:  No driving, no strenuous activity.  No lifting more than 10 pounds, until instructed otherwise.  She is to continue with her breathing exercise daily.  She is to walk daily and increase her frequency and duration as tolerates.  WOUND CARE:  She may shower.  She is to cleanse her wounds with mild soap and water.  She is to call the office if any wound problems  arise.  FOLLOWUP APPOINTMENTS: 1. The patient has an appointment to see Dr. Cornelius Moras on February 07, 2010     at 3 p.m. and 30 minutes prior to this office appointment, a chest     x-ray will be obtained. 2. The patient needs to contact Dr. Anne Fu' office for followup     appointment in 2 weeks. 3. The patient needs to contact Dr. Anne Fu' office for a PT and INR to     be drawn on Friday, February 04, 2010 at 9:30 a.m.  DISCHARGE MEDICATION AT THE TIME OF DICTATION: 1. Enteric-coated aspirin 81 mg p.o. daily. 2. Lopressor 12.5 p.o. two times daily. 3. Protonix 40 mg p.o. two times daily. 4. Reglan 10 mg p.o. three times daily for three days. 5. Ultram 50 mg one to two tablets every 6 hours p.r.n. pain. 6. Coumadin 2.5 mg p.o. every evening or as directed by Dr. Anne Fu'     office. 7. Bactrim 400/80 one tablet p.o. every other day. 8. Multivitamin p.o. daily. 9. Calcium carbonate/vitamin D p.o. daily. Patient was not placed on an Ace Inhibitor as has a preserved EF and blood pressure  well controlled with Beta Blocker.     Doree Fudge, PA   ______________________________ Salvatore Decent Cornelius Moras, M.D.    DZ/MEDQ  D:  02/01/2010  T:  02/02/2010  Job:  [id number]  cc:   Jake Bathe, MD M. Leda Quail, MD  Electronically Signed by Doree Fudge PA on 02/02/2010 09:38:04 AM Electronically Signed by Tressie Stalker M.D. on 02/03/2010 07:45:00 AM

## 2010-02-04 ENCOUNTER — Other Ambulatory Visit: Payer: Self-pay | Admitting: Thoracic Surgery (Cardiothoracic Vascular Surgery)

## 2010-02-04 DIAGNOSIS — I050 Rheumatic mitral stenosis: Secondary | ICD-10-CM

## 2010-02-07 ENCOUNTER — Ambulatory Visit
Admission: RE | Admit: 2010-02-07 | Discharge: 2010-02-07 | Disposition: A | Discharge status: Home / Self Care | Payer: BC Managed Care – PPO | Source: Ambulatory Visit | Attending: Thoracic Surgery (Cardiothoracic Vascular Surgery) | Admitting: Thoracic Surgery (Cardiothoracic Vascular Surgery)

## 2010-02-07 ENCOUNTER — Encounter (INDEPENDENT_AMBULATORY_CARE_PROVIDER_SITE_OTHER): Payer: BC Managed Care – PPO | Admitting: Thoracic Surgery (Cardiothoracic Vascular Surgery)

## 2010-02-07 DIAGNOSIS — I059 Rheumatic mitral valve disease, unspecified: Secondary | ICD-10-CM

## 2010-02-07 DIAGNOSIS — I050 Rheumatic mitral stenosis: Secondary | ICD-10-CM

## 2010-02-11 NOTE — Assessment & Plan Note (Signed)
OFFICE VISIT  Helen Swanson, Helen Swanson A DOB:  [birth year]                                        February 07, 2010 CHART #:  [id number]  HISTORY OF PRESENT ILLNESS:  Ms. Demeritt returns for routine followup status post right miniature thoracotomy for mitral valve repair on January 25, 2010.  Her early postoperative recovery was notable for the development of somewhat protracted postoperative nausea that took several days to resolve.  Serum amylase and lipase were borderline elevated.  Abdominal ultrasound was unremarkable, although raise question about the head of the pancreas.  Followup CT scan of the abdomen did not reveal any definite pathology, although there was mention made of a vague area of low attenuation in the uncinate process of the pancreas that could potentially have been related to a focal area of pancreatitis.  During this period of time of diagnostic workup while she remained in the hospital, her symptoms all completely resolved and she was discharged home last week.  Since then she has continued to do very well.  She states that she is eating fine and she has never had any more problems with nausea or vomiting.  She states that now she is eating more than she ever did prior to surgery.  She is not having any shortness of breath.  She has mild residual soreness in her chest which really only bothers her when she tries to sleep at night.  She states that she still cannot quite get comfortable lying in bed and she has been sleeping in a recliner.  She takes 1 pain pill at bedtime and this seems to help her.  When she is up and around during the day, she is not having any problems with pain and she is not taking any other pain relievers.  Her breathing is quite good.  She has not had any tachy palpitations.  Exercise tolerance continues to improve every day. Overall, she has no complaints.  PHYSICAL EXAMINATION:  GENERAL:  Well-appearing female. VITAL  SIGNS:  Blood pressure 117/79, pulse 100 and regular, oxygen saturation 97% on room air. CHEST:  Mini thoracotomy incision that is healing nicely.  The chest tube sutures are removed.  Auscultation reveals clear breath sounds that are symmetrical bilaterally.  No wheezes, rales, or rhonchi are noted. CARDIOVASCULAR:  Regular rate and rhythm.  No murmurs, rubs, or gallops are noted. ABDOMEN:  Soft, nontender.  Bowel sounds are present. EXTREMITIES:  Warm and well perfused.  The small incision in the right groin is healing nicely.  There is no lower extremity edema.  Pulses are palpable.  The remainder of her physical exam is noncontributory.  DIAGNOSTIC TEST:  Chest x-ray performed today at the [facility] is reviewed.  This demonstrates clear lung fields bilaterally with further clearing in the mild right lower lobe atelectasis and associated small right pleural effusion, which has essentially completely resolved at this time.  There still may be some atelectasis of the right middle lobe and/or fluid in the fissure with some opacity noted medially in the right lung base, but otherwise the lung fields have improved.  No other abnormalities are noted.  IMPRESSION:  Excellent progress following recent mitral valve repair.  PLAN:  I have instructed Ms. Sonnier to go ahead and stop taking aspirin as she probably does not need to be on both aspirin  and Coumadin at this time.  I have also suggested that she should go ahead and stop taking Reglan, which she was prescribed when she first went home because of previous nausea and vomiting.  I have encouraged her to continue to gradually increase her physical activity as tolerated with her only significant limitation at this point remaining that she refrain from heavy lifting or strenuous use of her arms or shoulders.  I think that within the next week if she continues to improve, she can probably start driving an automobile  presuming that she is not having any problems with significant pain, dizziness, or shortness of breath.  Overall, she looks good and seems to be recovering nicely.  We will plan to see her back in 4 weeks for routine followup.  Salvatore Decent. Cornelius Moras, M.D. Electronically Signed  CHO/MEDQ  D:  02/07/2010  T:  02/08/2010  Job:  [id number]  cc:   Jake Bathe, MD M. Leda Quail, MD

## 2010-03-02 NOTE — Consult Note (Signed)
  NAMEKEEGHAN, MCINTIRE             ACCOUNT NO.:  [id number]  MEDICAL RECORD NO.:  [id number]          PATIENT TYPE:  INP  LOCATION:  2307                         FACILITY:  [facility]  PHYSICIAN:  Burna Forts, M.D.DATE OF BIRTH:  29-Apr-1945  DATE OF CONSULTATION:  01/25/2010 DATE OF DISCHARGE:                                CONSULTATION   PROCEDURE:  Left internal jugular central vein cannulation.  The patient was brought to the holding area on the morning of surgery where under local anesthesia with sedation, the left internal jugular vein was sterilely prepped and draped with full barrier precautions. Local anesthesia is used.  The tunnel site again is used to identify the vein location.  The vein is accessed with a small venous stick towards the wire passed.  There was then some small obstruction noted and the wire was withdrawn.  The patient's area of the cannula itself was slightly removed back and was repassed.  The wire again was passed, again there were some slight obstruction with this wire passage, we then abandoned that procedure moving to lower down the neck again, was able to cannulate, but was apparently at the left IJ.  However, there were some mild obstruction to passage of the wire.  We therefore did not pass the  larger cannula and introducer into the left IJ.  We then sterilely prepped and draped the left subclavian area under the clavicle.  Local anesthesia again was used.  The left subclavian vein was easily accessed with a stick of needle.  Wire then was passed with only moderate difficulty, and then the introducer was placed over the wire with good positive heme flow.  There was additional difficulty passing the PA catheter at this time in this patient, we like to then to try to pass the pulmonary artery catheter at a later time  during the surgery once the patient was asleep and in position.  Of note, during the operative procedure, it was noted that  within the chest, there was a small superior hematoma appreciated just below the venous area of the innominate and the left internal jugular junction. This is believed to be a small puncture that was created by attempted passage of the wire several times.  This blood was subsequently cleaned out the suction by the surgeon himself.  There did not appear to be any significant further bleeding in that area and the operative procedure was planned to be carried out, which was the minimally invasive repair of mitral valve.          ______________________________ Burna Forts, M.D.     JTM/MEDQ  D:  01/25/2010  T:  01/26/2010  Job:  [id number]  Electronically Signed by Ester Rink M.D. on 03/02/2010 05:58:46 AM

## 2010-03-02 NOTE — Op Note (Signed)
Helen Swanson, Helen Swanson             ACCOUNT NO.:  [id number]  MEDICAL RECORD NO.:  [id number]           PATIENT TYPE:  LOCATION:                                 FACILITY:  PHYSICIAN:  Burna Forts, M.D.DATE OF BIRTH:  01-27-1945  DATE OF PROCEDURE:  01/27/2010 DATE OF DISCHARGE:                              OPERATIVE REPORT   Intraoperative transesophageal echocardiographic report.  INDICATIONS FOR PROCEDURE:  Helen Swanson is a 65 year old female who presents today for mitral valve repair or replacement by Dr. Tressie Stalker.  She is brought to the holding area on the morning of surgery where under local anesthesia and sedation, pulmonary arterial lines were placed, then taken to OR for routine induction of general anesthesia after which double-lumen tube was placed and then the TEE probe was prepared and placed oropharyngeally into the stomach and slightly withdrawn for imaging of the cardiac structures.  PRECARDIOPULMONARY BYPASS EXAMINATION: 1. Left ventricle.  The left ventricular chamber is initially seen in     a four-chamber view.  Overall, this is a large chamber by volume     with normal contractile pattern appreciated.  Short axis view again     shows chamber size is fact increased.  There is satisfactory to     good overall contractility in all segmental wall areas that are     examined.  There are no masses noted within the left ventricular     chamber itself. 2. Mitral valve.  The mitral valve initially seen in this four-chamber     view reveals a significantly thickened degenerative and billing     apparatus with both leaflets billing above the level of the normal     coaptation point well into the left atrial chamber.  Multiple views     were obtained.  There was a knuckle of the posterior leaflet which     appears well into the left atrial chamber size.  There are no flail     segments appreciated; however, there are bilateral leaflet of     prolapse without  flail into the left atrial chamber.  Color Doppler     reveals that there are several jets throughout the distance of the     P1, 2, and 3 leaflets overall though it does not appear significant     or severe.  Multiple jets do exist and there is an appearance of     mitral regurgitant flow well into the left atrial chamber.  This     would be characteristic of at least moderate-to-severe mitral     regurgitant flow. 3. Left atrium.  The left atrial chamber is itself interrogated in     view.  There is an enlarged chamber itself.  The septum is intact.     There is a large appendage.  There are no masses noted within the     appendage either. 4. Right ventricle.  Enlarged right ventricular chamber is     appreciated, though good vigorous contractility exists. 5. Tricuspid valve then compliant, mobile tricuspid valve was     apparent. 6. Right atrium.  Normal right atrial  chamber seen.  Pulmonary artery     catheter is within.  Subsequent STU is used to confirm arterial     placement, venous placement, and coronary sinus placement prior to     the initiation and repair of the bypass. 7. The patient was placed on cardiopulmonary bypass.  Total body     hypothermia is conducted and a repair and annuloplasty was     performed per Dr. Cornelius Moras.  The patient is rewarmed and separated from     cardiopulmonary bypass with the initial attempt.  POSTCARDIOPULMONARY BYPASS TEE EXAM: 1. The left ventricular chamber was seen initially in the short axis     view as we begin separation of cardiopulmonary bypass.  There     remains enlarged, the vigorous in its function and performance.     Long axis and short axis views were obtained and showed nearly     normal left ventricular performance. 2. Mitral valve.  The ring annuloplasty was well seen now in the four-     chamber view overlooking the mitral valve apparatus itself.  The     ring appears well seated.  The repair appears satisfactory.  The      anterior leaflet primary is seen.  Some aspect of the posterior     leaflet is well seen.  Multiple views are carried out.  There is     essentially no regurgitant flow appreciated in any of the views.     The 3-D images are also obtained revealing again excellent ring     annuloplasty repair without any problems of regurgitant flow. 3. The rest of the cardiac examination was as previously described     without any significant changes and the patient is returned to the     cardiac intensive care unit in stable condition.          ______________________________ Burna Forts, M.D.     JTM/MEDQ  D:  01/27/2010  T:  01/27/2010  Job:  [id number]  Electronically Signed by Ester Rink M.D. on 03/02/2010 05:58:51 AM

## 2010-03-07 ENCOUNTER — Encounter (HOSPITAL_COMMUNITY): Payer: BC Managed Care – PPO | Attending: Cardiology

## 2010-03-07 ENCOUNTER — Encounter (INDEPENDENT_AMBULATORY_CARE_PROVIDER_SITE_OTHER): Payer: Self-pay | Admitting: Thoracic Surgery (Cardiothoracic Vascular Surgery)

## 2010-03-07 DIAGNOSIS — I059 Rheumatic mitral valve disease, unspecified: Secondary | ICD-10-CM

## 2010-03-07 DIAGNOSIS — Z9889 Other specified postprocedural states: Secondary | ICD-10-CM | POA: Insufficient documentation

## 2010-03-07 DIAGNOSIS — Z5189 Encounter for other specified aftercare: Secondary | ICD-10-CM | POA: Insufficient documentation

## 2010-03-08 NOTE — Assessment & Plan Note (Signed)
OFFICE VISIT  Helen Swanson, Helen Swanson A DOB:  Jul 06, 1945                                        March 07, 2010 CHART #:  [id number]  HISTORY OF PRESENT ILLNESS:  The patient returns for routine followup status post right miniature thoracotomy for mitral valve repair.  She was last seen here in the office on February 07, 2010.  Since then, she has done very well.  She has started the cardiac rehab program and so far she is enjoying it very much.  Her exercise tolerance is improving. She has minimal residual soreness in her chest.  She is now back to most of her normal daily activities.  Her appetite is good.  She has no other complaints.  She has no tachy palpitations.  She is not having shortness of breath.  The remainder of review of systems is unremarkable.  She has been doing well on Coumadin therapy and has not had any problems maintaining therapeutic INR.  PHYSICAL EXAMINATION:  General:  Well-appearing female.  Vital Signs: Blood pressure 114/80, pulse 100 and regular, oxygen saturation 96% on room air.  Chest:  Auscultation of the chest reveals clear breath sounds that are symmetrical bilaterally.  No wheezes, rales, or rhonchi are noted.  Cardiovascular:  Regular rate and rhythm.  No murmurs, rubs, or gallops are appreciated.  Extremities:  Warm and well perfused.  There is no lower extremity edema.  The remainder of her physical exam is unremarkable.  IMPRESSION:  Excellent progress following mitral valve repair.  PLAN:  I have encouraged the patient to continue to increase her physical activity without any particular limitations at this time.  I think it would be reasonable to keep her on Coumadin for another 4 weeks or so after which time, it can be stopped and aspirin therapy can be resumed.  All of her questions have been addressed.  We will plan to see her back in 2 months' time.  At some point new baseline followup echocardiogram would be  reasonable to check LV function and the status of the valve repair.  Salvatore Decent. Cornelius Moras, M.D. Electronically Signed  CHO/MEDQ  D:  03/07/2010  T:  03/08/2010  Job:  [id number]  cc:   Jake Bathe, MD M. Leda Quail, MD

## 2010-03-09 ENCOUNTER — Encounter (HOSPITAL_COMMUNITY): Payer: BC Managed Care – PPO

## 2010-03-11 ENCOUNTER — Encounter (HOSPITAL_COMMUNITY): Payer: BC Managed Care – PPO

## 2010-03-14 ENCOUNTER — Encounter (HOSPITAL_COMMUNITY): Payer: BC Managed Care – PPO

## 2010-03-16 ENCOUNTER — Encounter (HOSPITAL_COMMUNITY): Payer: BC Managed Care – PPO

## 2010-03-18 ENCOUNTER — Encounter (HOSPITAL_COMMUNITY): Payer: BC Managed Care – PPO

## 2010-03-21 ENCOUNTER — Encounter (HOSPITAL_COMMUNITY): Payer: BC Managed Care – PPO

## 2010-03-23 ENCOUNTER — Encounter (HOSPITAL_COMMUNITY): Payer: BC Managed Care – PPO

## 2010-03-25 ENCOUNTER — Encounter (HOSPITAL_COMMUNITY): Payer: BC Managed Care – PPO

## 2010-03-28 ENCOUNTER — Encounter (HOSPITAL_COMMUNITY): Payer: BC Managed Care – PPO

## 2010-03-30 ENCOUNTER — Encounter (HOSPITAL_COMMUNITY): Payer: BC Managed Care – PPO

## 2010-04-01 ENCOUNTER — Encounter (HOSPITAL_COMMUNITY): Payer: BC Managed Care – PPO

## 2010-04-04 ENCOUNTER — Encounter (HOSPITAL_COMMUNITY): Payer: BC Managed Care – PPO | Attending: Cardiology

## 2010-04-04 DIAGNOSIS — I059 Rheumatic mitral valve disease, unspecified: Secondary | ICD-10-CM | POA: Insufficient documentation

## 2010-04-04 DIAGNOSIS — Z5189 Encounter for other specified aftercare: Secondary | ICD-10-CM | POA: Insufficient documentation

## 2010-04-04 DIAGNOSIS — Z9889 Other specified postprocedural states: Secondary | ICD-10-CM | POA: Insufficient documentation

## 2010-04-06 ENCOUNTER — Encounter (HOSPITAL_COMMUNITY): Payer: BC Managed Care – PPO

## 2010-04-08 ENCOUNTER — Encounter (HOSPITAL_COMMUNITY): Payer: BC Managed Care – PPO

## 2010-04-11 ENCOUNTER — Encounter (HOSPITAL_COMMUNITY): Payer: BC Managed Care – PPO

## 2010-04-13 ENCOUNTER — Encounter (HOSPITAL_COMMUNITY): Payer: BC Managed Care – PPO

## 2010-04-15 ENCOUNTER — Encounter (HOSPITAL_COMMUNITY): Payer: BC Managed Care – PPO

## 2010-04-18 ENCOUNTER — Encounter (HOSPITAL_COMMUNITY): Payer: BC Managed Care – PPO

## 2010-04-20 ENCOUNTER — Encounter (HOSPITAL_COMMUNITY): Payer: BC Managed Care – PPO

## 2010-04-22 ENCOUNTER — Encounter (HOSPITAL_COMMUNITY): Payer: BC Managed Care – PPO

## 2010-04-25 ENCOUNTER — Encounter (HOSPITAL_COMMUNITY): Payer: BC Managed Care – PPO

## 2010-04-27 ENCOUNTER — Encounter (HOSPITAL_COMMUNITY): Payer: BC Managed Care – PPO

## 2010-04-29 ENCOUNTER — Encounter (HOSPITAL_COMMUNITY): Payer: BC Managed Care – PPO

## 2010-05-02 ENCOUNTER — Encounter (HOSPITAL_COMMUNITY): Payer: BC Managed Care – PPO

## 2010-05-02 ENCOUNTER — Encounter (INDEPENDENT_AMBULATORY_CARE_PROVIDER_SITE_OTHER): Payer: BC Managed Care – PPO | Admitting: Thoracic Surgery (Cardiothoracic Vascular Surgery)

## 2010-05-02 DIAGNOSIS — I059 Rheumatic mitral valve disease, unspecified: Secondary | ICD-10-CM

## 2010-05-03 NOTE — Assessment & Plan Note (Signed)
OFFICE VISIT  ELEANOR, GATLIFF A DOB:  05/01/1945                                        May 02, 2010 CHART #:  [id number]  HISTORY OF PRESENT ILLNESS:  The patient returns for followup status post mitral valve repair on January 25, 2010.  She was last seen here in the office on March 07, 2010.  Since then, she has done very well.  She had a followup echocardiogram performed at Dr. Anne Fu office that reportedly looked good.  We have not seen the report from this exam, but the patient was told that everything looked great.  She is now off of all medications and she has completed 2 out of 3 months in the cardiac rehab program.  She has been delighted with her progress and notes that her exercise tolerance is now much better than it ever was prior to surgery.  She has no shortness of breath and overall feels great.  The remainder of her review of systems unremarkable.  PHYSICAL EXAM:  Well-appearing female with blood pressure 117/85, pulse 100, oxygen saturation 97% on room air.  Examination of the chest reveals clear breath sounds that are symmetrical bilaterally. Cardiovascular exam includes regular rate and rhythm.  No murmurs, rubs, or gallops are appreciated.  The abdomen is soft, nontender.  The extremities are warm and well perfused.  There is no lower extremity edema.  IMPRESSION:  Excellent progress just over 3 months following mitral valve repair.  The patient is doing exceptionally well.  PLAN:  In the future, the patient will call and return to see [place] as needed.  All of her questions have been addressed.  She has no physical limitations with respect to her previous surgery.  Salvatore Decent. Cornelius Moras, M.D. Electronically Signed  CHO/MEDQ  D:  05/02/2010  T:  05/02/2010  Job:  [id number]  cc:   Dr. Leda Quail

## 2010-05-04 ENCOUNTER — Encounter (HOSPITAL_COMMUNITY): Payer: BC Managed Care – PPO | Attending: Cardiology

## 2010-05-04 DIAGNOSIS — I059 Rheumatic mitral valve disease, unspecified: Secondary | ICD-10-CM | POA: Insufficient documentation

## 2010-05-04 DIAGNOSIS — Z5189 Encounter for other specified aftercare: Secondary | ICD-10-CM | POA: Insufficient documentation

## 2010-05-04 DIAGNOSIS — Z9889 Other specified postprocedural states: Secondary | ICD-10-CM | POA: Insufficient documentation

## 2010-05-06 ENCOUNTER — Encounter (HOSPITAL_COMMUNITY): Payer: BC Managed Care – PPO

## 2010-05-09 ENCOUNTER — Encounter (HOSPITAL_COMMUNITY): Payer: BC Managed Care – PPO

## 2010-05-11 ENCOUNTER — Encounter (HOSPITAL_COMMUNITY): Payer: BC Managed Care – PPO

## 2010-05-13 ENCOUNTER — Encounter (HOSPITAL_COMMUNITY): Payer: BC Managed Care – PPO

## 2010-05-16 ENCOUNTER — Encounter (HOSPITAL_COMMUNITY): Payer: BC Managed Care – PPO

## 2010-05-17 NOTE — Consult Note (Signed)
NEW PATIENT CONSULTATION   Helen Swanson, Helen Swanson  DOB:  02/26/1945                                        December 20, 2009  CHART #:  [id number]   Date of planned hospital admission and surgery will be January 25, 2010.   REASON FOR CONSULTATION:  Severe mitral regurgitation.   HISTORY OF PRESENT ILLNESS:  The patient is Swanson 65 year old female from  [place] with no previous cardiac history, who was recently noted to  have Swanson heart murmur on routine physical exam by her primary care  physician, Dr. Leda Quail.  The patient was referred to Dr. Donato Schultz and underwent Swanson 2-D echocardiogram at the [facility] office  on November 11, 2009.  This revealed the presence of normal left  ventricular size and function.  There was bileaflet prolapse of the  mitral valve with moderate-to-severe mitral regurgitation.  There was  mild tricuspid regurgitation.  Left ventricular function was  hyperdynamic with ejection fraction estimated 65-70%.  No other  abnormalities were noted.  The patient subsequently underwent  transesophageal echocardiogram by Dr. Anne Fu on December 03, 2009.  This  confirmed the presence of severe mitral regurgitation with bileaflet  prolapse of the mitral valve.  There is normal left ventricular size and  function.  No other abnormalities were noted.  To further stratify her  risk, the patient underwent an exercise stress test.  She apparently  could only exercise to 3 minutes on Swanson standard protocol and had to stop  due to fatigue, shortness of breath, and tightness in her legs.  She has  now been referred to consider elective mitral valve repair.   REVIEW OF SYSTEMS:  GENERAL:  The patient reports normal appetite.  She  has not been gaining nor losing weight recently.  She is 5 feet tall and  weighs 100 pounds.  This is stable.  CARDIAC:  The patient denies any shortness of breath either with  activity or at rest, although she admits that  she does not push  physically and she did get short of breath on her recent stress test.  She has also had some vague tightness across her chest that seems to be  worse at night when she is sleeping.  She thinks this has probably been  going on for 2 months or so.  Occasionally, she feels her heart  pounding.  She has not had any dizzy spells.  She has not had any  resting shortness of breath and she specifically denies any PND,  orthopnea, or lower extremity edema.  RESPIRATORY:  Notable in that the patient does report Swanson dry,  nonproductive, hot hacking cough that seem to have come on about 2  months ago and has not gone away.  She denies any productive cough,  hemoptysis, or wheezing.  GASTROINTESTINAL:  Negative.  The patient has no difficulty swallowing.  She reports normal bowel function.  She denies hematochezia,  hematemesis, or melena.  GENITOURINARY:  Negative.  The patient does have Swanson history of recurrent  urinary tract infections in the past and she has not had any recently  and she specifically denies any urgency, frequency, or dysuria.  PERIPHERAL VASCULAR:  Negative.  NEUROLOGIC:  Negative.  MUSCULOSKELETAL:  Notable for mild chronic arthritis afflicting the  fingers of both hands.  PSYCHIATRIC:  Negative.  HEENT:  Negative.  The patient has good dentition, sees her dentist on Swanson  regular basis.   PAST MEDICAL HISTORY:  1. Mitral valve prolapse with severe mitral regurgitation, recently      diagnosed.  2. Recurrent urinary tract infections.  3. Arthritis.   PAST SURGICAL HISTORY:  1. Right shoulder surgery.  2. Right elbow surgery.  3. Bilateral wrist surgery for tendonitis.  4. Hysterectomy.  5. Toe surgery, left foot.   FAMILY HISTORY:  Noncontributory.   SOCIAL HISTORY:  The patient is single and lives alone here in  [place].  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  Since retirement, she remains very active and she   volunteers both for the [employer] and at her [employer].  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  She is Swanson nonsmoker.  She does not use alcohol.   CURRENT MEDICATIONS:  Bactrim 1 tablet daily, multivitamin 1 tablet  daily, vitamin C 1 tablet daily, vitamin D with calcium 1 tablet daily,  Sudafed as needed, and Advil as needed.   DRUG ALLERGIES AND SENSITIVITIES:  Codeine which causes upset stomach.   PHYSICAL EXAMINATION:  Well-appearing female who appears her stated age,  in no acute distress.  Blood pressure 140/88, pulse 78 and regular, and  oxygen saturation 98% on room air.  HEENT exam is unrevealing.  The neck  is supple.  There is no cervical nor supraclavicular lymphadenopathy.  There is no jugular venous distention.  No carotid bruits noted.  Auscultation of the chest demonstrates clear breath sounds which are  symmetrical bilaterally.  No wheezes, rales, or rhonchi are noted.  Cardiovascular exam includes regular rate and rhythm.  There is Swanson grade  3/6 holosystolic murmur heard best across the precordium with radiation  to the back until the left sternal border.  No diastolic murmurs are  noted.  The abdomen is soft, nondistended, and nontender.  Bowel sounds  are present.  Femoral pulses are thready, but palpable bilaterally.  Distal pulses are thready, but palpable.  There is no lower extremity  edema.  There is no sign of significant venous insufficiency.  The skin  is clean, dry, and healthy appearing throughout.  Rectal and GU exams  are both deferred.   DIAGNOSTIC TESTS:  Both transthoracic and transesophageal  echocardiograms performed over the last 2 months and been reviewed.  These demonstrate bileaflet prolapse of the mitral valve with billowing,  redundant leaflets consistent with Barlow syndrome.  There is what  appears to be severe (4+) mitral regurgitation.  There are several  different jets of  regurgitation with Swanson prominent central jet that is  directed eccentrically somewhat posteriorly.  Left ventricular size and  function appears normal.  No other significant abnormalities are noted.  There does not appear to be significant calcification in the valve  leaflets or annulus.  The aortic valve appears normal.   IMPRESSION:  Mitral valve prolapse with severe mitral regurgitation.  The patient has recently had Swanson dry nonproductive cough as well as some  atypical chest tightness across her chest and she developed shortness of  breath fairly quickly on Swanson standard Bruce protocol exercise stress test.  I agree that it would make sense to proceed with elective mitral valve  repair.  Based upon transesophageal and transthoracic echocardiogram,  there is Swanson high likelihood that her valve should be repairable.  She  will likely be an excellent candidate for use of minimally invasive  approach, although cardiac catheterization has not yet been performed.   PLAN:  I have discussed matters at length with the patient here in the  office today.  The rationale for surgical intervention has been  discussed at length and compared and contrasted with continued close  followup with Medical Therapy.  She seems to be eager to proceed with  surgery.  The rationale for minimally invasive approach has been  discussed and the risks and benefits have been compared and contrasted  to conventional sternotomy.  All of her questions have been addressed.  She is very interested in proceeding with surgery sometime next month  after she returns from Swanson cruise that she plans.  She will need left  heart catheterization performed prior to surgery.  We would ask that  aortography of the descending thoracic-abdominal aorta and iliac vessels  be performed at the time of catheterization to image these vessels due  to plans for use of femoral artery cannulation at the time of surgery.  We will tentatively plan to proceed  with surgery on Tuesday, January 25, 2010.  We will plan to see the patient back here in the office on  January 17, 2010, to review the results of her catheterization and make  final plans for surgery.  We will plan to give her Swanson prescription for  amiodarone prophylactically to begin at that time.   Salvatore Decent. Cornelius Moras, M.D.  Electronically Signed   CHO/MEDQ  D:  12/20/2009  T:  12/20/2009  Job:  [id number]   cc:   Jake Bathe, MD  Dr. Leda Quail

## 2010-05-17 NOTE — Assessment & Plan Note (Signed)
OFFICE VISIT   Helen Swanson, Helen Swanson A  DOB:  1945/03/02                                        January 17, 2010  CHART #:  [id number]   The patient returns for further followup with tentative plans to proceed  with right miniature thoracotomy for mitral valve repair next week.  She  was originally seen in consultation on December 20, 2009, and a full  consultation note history and physical exam was dictated at that time.  Since then, she has gone on a cruise and she has done very well.  However, she has not yet had her heart catheterization performed and she  returned to the office today to review the results of this exam.  This  catheterization is apparently scheduled for Wednesday of this week.  We  again discussed the indications, risks, and potential benefits of  surgery.  We will review her catheterization findings after it has been  performed on Wednesday.  She understands that if she does have any  coronary artery disease, this would probably change our plans  substantially.  Assuming that she does and we will plan to proceed with  surgery as previously discussed.  All of her questions have been  addressed.  I have given her a prescription for amiodarone 400 mg p.o.  twice daily to begin tomorrow in anticipation of surgery to decrease her  risk of atrial arrhythmias.   Salvatore Decent. Cornelius Moras, M.D.  Electronically Signed   CHO/MEDQ  D:  01/17/2010  T:  01/18/2010  Job:  [id number]   cc:   Jake Bathe, MD  Dr. Leda Quail

## 2010-05-18 ENCOUNTER — Encounter (HOSPITAL_COMMUNITY): Payer: BC Managed Care – PPO

## 2010-05-20 ENCOUNTER — Encounter (HOSPITAL_COMMUNITY): Payer: BC Managed Care – PPO

## 2010-05-23 ENCOUNTER — Encounter (HOSPITAL_COMMUNITY): Payer: BC Managed Care – PPO

## 2010-05-25 ENCOUNTER — Encounter (HOSPITAL_COMMUNITY): Payer: BC Managed Care – PPO

## 2010-05-27 ENCOUNTER — Encounter (HOSPITAL_COMMUNITY): Payer: BC Managed Care – PPO

## 2010-05-30 ENCOUNTER — Encounter (HOSPITAL_COMMUNITY): Payer: BC Managed Care – PPO

## 2010-06-01 ENCOUNTER — Encounter (HOSPITAL_COMMUNITY): Payer: BC Managed Care – PPO

## 2010-06-03 ENCOUNTER — Encounter (HOSPITAL_COMMUNITY): Payer: BC Managed Care – PPO

## 2010-06-06 ENCOUNTER — Encounter (HOSPITAL_COMMUNITY): Payer: BC Managed Care – PPO

## 2010-06-08 ENCOUNTER — Encounter (HOSPITAL_COMMUNITY): Payer: BC Managed Care – PPO

## 2010-06-10 ENCOUNTER — Encounter (HOSPITAL_COMMUNITY): Payer: BC Managed Care – PPO

## 2010-11-28 ENCOUNTER — Other Ambulatory Visit: Payer: Self-pay | Admitting: Obstetrics & Gynecology

## 2010-11-28 DIAGNOSIS — M8580 Other specified disorders of bone density and structure, unspecified site: Secondary | ICD-10-CM

## 2010-11-28 DIAGNOSIS — Z1231 Encounter for screening mammogram for malignant neoplasm of breast: Secondary | ICD-10-CM

## 2011-01-19 ENCOUNTER — Ambulatory Visit (HOSPITAL_COMMUNITY)
Admission: RE | Admit: 2011-01-19 | Discharge: 2011-01-19 | Disposition: A | Discharge status: Home / Self Care | Payer: BC Managed Care – PPO | Source: Ambulatory Visit | Attending: Obstetrics & Gynecology | Admitting: Obstetrics & Gynecology

## 2011-01-19 DIAGNOSIS — Z1231 Encounter for screening mammogram for malignant neoplasm of breast: Secondary | ICD-10-CM

## 2011-01-19 DIAGNOSIS — M8580 Other specified disorders of bone density and structure, unspecified site: Secondary | ICD-10-CM

## 2011-06-07 IMAGING — CR DG CHEST 2V
2 series · 2 of 2 positions shown · non-contrast
Comparison: 01/27/2010

CLINICAL DATA: Chest tube removal

CHEST - 2 VIEW

[w chest pa]
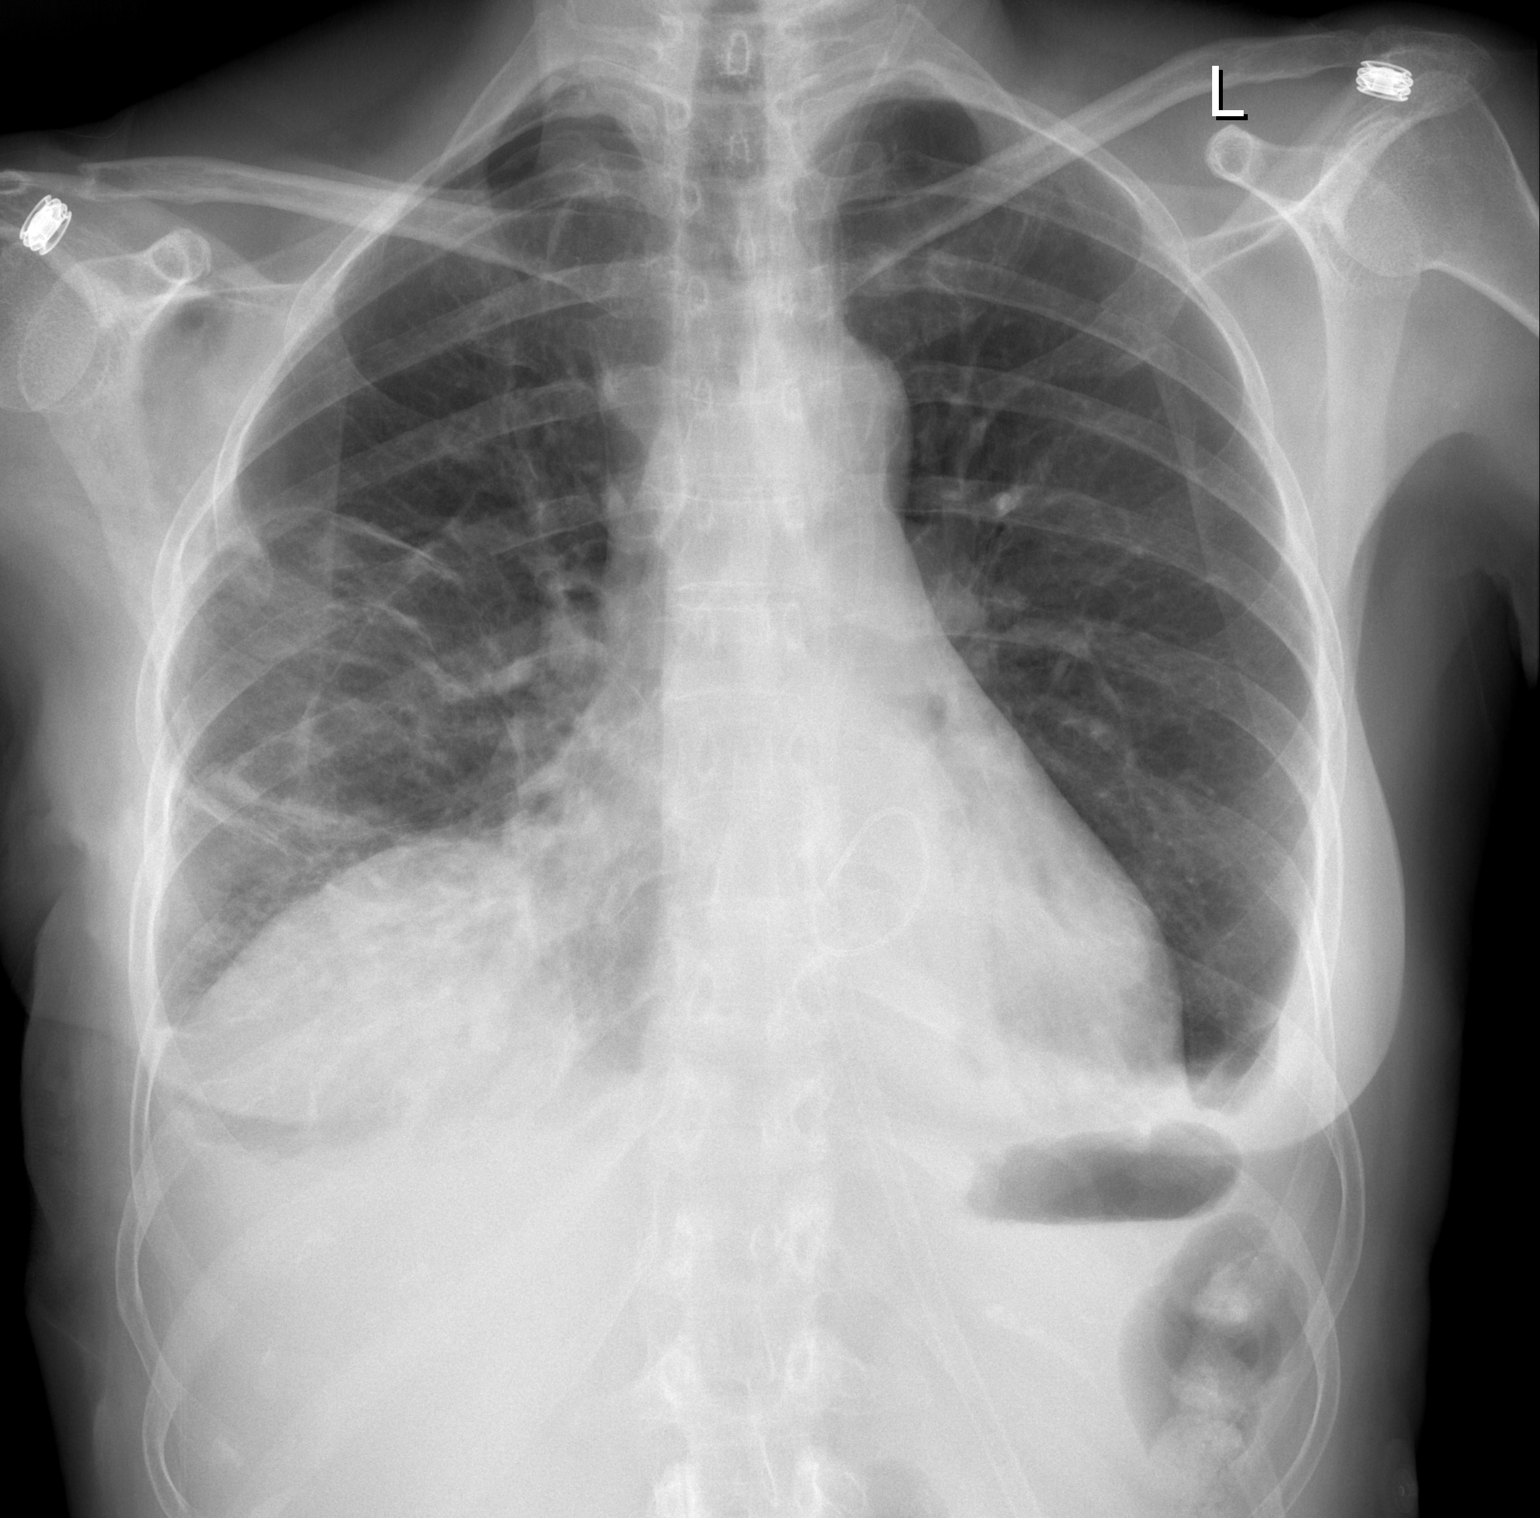

[w chest lat]
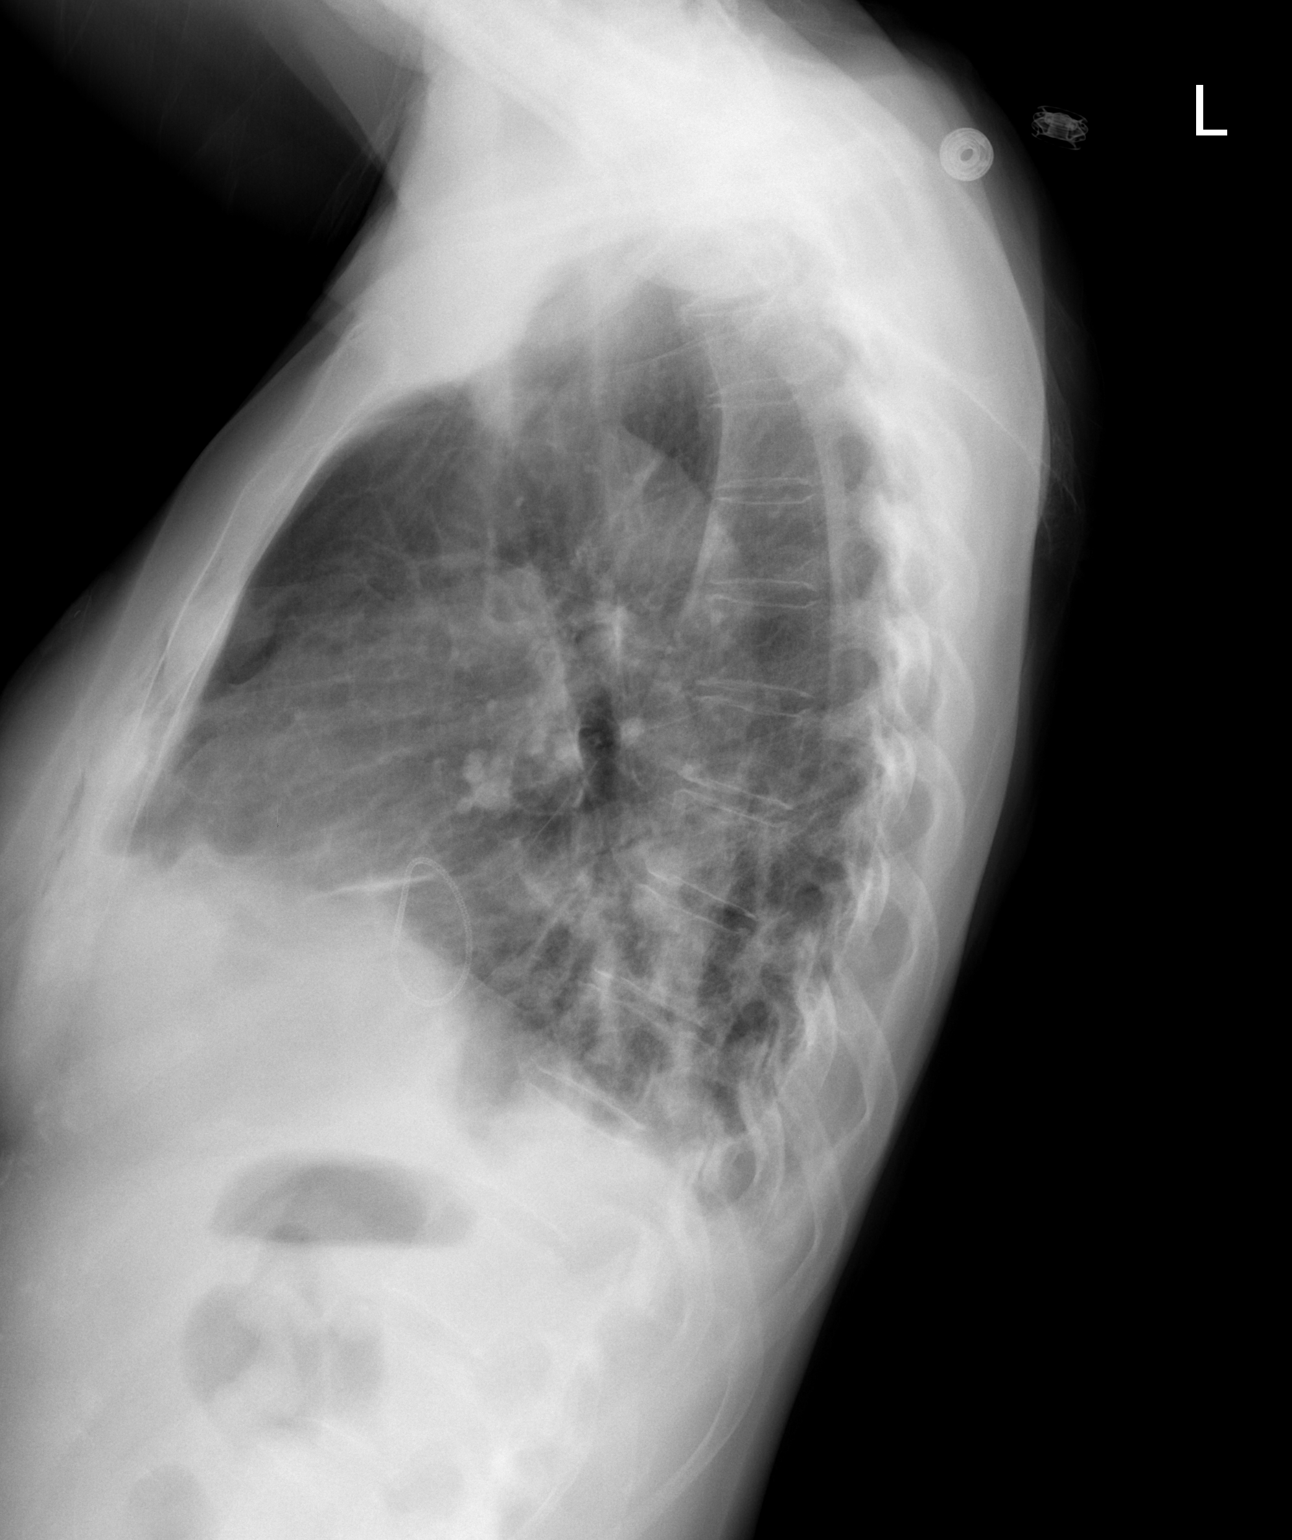

[2 of 2 positions shown; findings below may reference images not displayed]

FINDINGS: Right chest tube has been removed.  A tiny less than 5%
right apical pneumothorax has developed.  Airspace opacities at
both lung bases have markedly improved.  Tiny pleural effusions are
suspected.  Mild cardiomegaly.
IMPRESSION: Improved bilateral airspace opacities.

Tiny right pneumothorax after chest tube removal.

## 2011-12-11 ENCOUNTER — Other Ambulatory Visit: Payer: Self-pay | Admitting: Obstetrics & Gynecology

## 2011-12-11 DIAGNOSIS — Z1231 Encounter for screening mammogram for malignant neoplasm of breast: Secondary | ICD-10-CM

## 2012-01-25 ENCOUNTER — Ambulatory Visit (HOSPITAL_COMMUNITY)
Admission: RE | Admit: 2012-01-25 | Discharge: 2012-01-25 | Disposition: A | Discharge status: Home / Self Care | Payer: BC Managed Care – PPO | Source: Ambulatory Visit | Attending: Obstetrics & Gynecology | Admitting: Obstetrics & Gynecology

## 2012-01-25 DIAGNOSIS — Z1231 Encounter for screening mammogram for malignant neoplasm of breast: Secondary | ICD-10-CM

## 2012-04-01 ENCOUNTER — Telehealth: Payer: Self-pay | Admitting: Obstetrics & Gynecology

## 2012-04-01 DIAGNOSIS — Z87440 Personal history of urinary (tract) infections: Secondary | ICD-10-CM

## 2012-04-01 NOTE — Telephone Encounter (Signed)
Patient states she needs a refill of cipro as she continues to have UTIs.

## 2012-04-01 NOTE — Telephone Encounter (Signed)
Ok to RF cipro 500mg  bid x 7 days.  I last treated her for UTI 11/12.  How many has she had since then?

## 2012-04-01 NOTE — Telephone Encounter (Signed)
Please advise if ok to refill or if she needs office visit to evaluate.(chart in box)

## 2012-04-02 ENCOUNTER — Other Ambulatory Visit: Payer: Self-pay

## 2012-04-02 MED ORDER — CIPROFLOXACIN HCL 500 MG PO TABS: ORAL_TABLET | ORAL | Status: DC

## 2012-04-02 NOTE — Telephone Encounter (Signed)
Waiting on return call from [facility]

## 2012-04-02 NOTE — Telephone Encounter (Signed)
Spoke with patient, aware rx for cipro has been called to [facility]. States since she was here in 11/13 has been using the bactrim daily except when symptoms worsen then will use cipro for a couple of days with relief. Has used this about 5 times. Advised to use the cipro x 7 days to see if will completely clear. rx corrected over the phone for twice daily #14/0RF. Patient to call back prn.

## 2012-04-02 NOTE — Telephone Encounter (Signed)
Needs referral to urology.  Has been evaluated in the past but needs to be seen again.  Does she have a preference of who I send her to see?

## 2012-04-04 NOTE — Telephone Encounter (Signed)
Needs referral to Dr. Alfredo Martinez.  Order entered.

## 2012-04-04 NOTE — Telephone Encounter (Signed)
Spoke with patient- states ok with referral to urologist that Dr Hyacinth Meeker recommends. Is currently available on a Friday or in the afternoon.

## 2012-04-04 NOTE — Telephone Encounter (Signed)
rx sent to pharmacy. Patient aware.  

## 2012-04-15 ENCOUNTER — Telehealth: Payer: Self-pay | Admitting: Obstetrics & Gynecology

## 2012-04-15 NOTE — Telephone Encounter (Signed)
Spoke with pt  to advise her that she has an appt with Dr Sherron Monday at [facility] on May 1st. Pt reports her UTI symptoms came back on Friday. Pt has a bottle of Cipro she has not taken and was wondering if it would be OK for her to take it to "knock out" her symptoms. Pt reports that Cipro is the only thing that helps now that it seems she is resistant to Bactrim. Please advise.  aa

## 2012-04-15 NOTE — Telephone Encounter (Signed)
Yes, take the cipro 500mg  bid x 5 days.

## 2012-04-15 NOTE — Telephone Encounter (Signed)
Patient was told to call last week if she has not heard from Dr. Garen Lah nurse regarding referral.

## 2012-04-15 NOTE — Telephone Encounter (Signed)
LM on pt's VM confirming name that she can take Cipro twice daily for 5 days. Pt to call back if she has questions or does not have enough of the med.  aa

## 2012-04-17 NOTE — Telephone Encounter (Signed)
Left message on CB# of home and cell of need to confirm appt. Day with [facility] and medication of Cipro.

## 2012-10-09 ENCOUNTER — Encounter: Payer: Self-pay | Admitting: Cardiology

## 2012-11-11 ENCOUNTER — Ambulatory Visit: Payer: BC Managed Care – PPO | Admitting: Cardiology

## 2012-11-19 ENCOUNTER — Encounter: Payer: Self-pay | Admitting: Cardiology

## 2012-11-19 ENCOUNTER — Encounter: Payer: Self-pay | Admitting: *Deleted

## 2012-11-19 DIAGNOSIS — M8580 Other specified disorders of bone density and structure, unspecified site: Secondary | ICD-10-CM | POA: Insufficient documentation

## 2012-11-19 DIAGNOSIS — R011 Cardiac murmur, unspecified: Secondary | ICD-10-CM | POA: Insufficient documentation

## 2012-11-19 DIAGNOSIS — M810 Age-related osteoporosis without current pathological fracture: Secondary | ICD-10-CM | POA: Insufficient documentation

## 2012-11-19 DIAGNOSIS — I340 Nonrheumatic mitral (valve) insufficiency: Secondary | ICD-10-CM | POA: Insufficient documentation

## 2012-11-19 DIAGNOSIS — I959 Hypotension, unspecified: Secondary | ICD-10-CM | POA: Insufficient documentation

## 2012-11-19 DIAGNOSIS — R000 Tachycardia, unspecified: Secondary | ICD-10-CM | POA: Insufficient documentation

## 2012-11-20 ENCOUNTER — Encounter: Payer: Self-pay | Admitting: Cardiology

## 2012-11-20 ENCOUNTER — Ambulatory Visit (INDEPENDENT_AMBULATORY_CARE_PROVIDER_SITE_OTHER): Payer: BC Managed Care – PPO | Admitting: Cardiology

## 2012-11-20 VITALS — BP 124/86 | HR 93 | Ht 60.0 in | Wt 104.0 lb

## 2012-11-20 DIAGNOSIS — I059 Rheumatic mitral valve disease, unspecified: Secondary | ICD-10-CM

## 2012-11-20 DIAGNOSIS — Z9889 Other specified postprocedural states: Secondary | ICD-10-CM

## 2012-11-20 DIAGNOSIS — R011 Cardiac murmur, unspecified: Secondary | ICD-10-CM

## 2012-11-20 DIAGNOSIS — R000 Tachycardia, unspecified: Secondary | ICD-10-CM

## 2012-11-20 DIAGNOSIS — I340 Nonrheumatic mitral (valve) insufficiency: Secondary | ICD-10-CM

## 2012-11-20 NOTE — Patient Instructions (Signed)
Your physician wants you to follow-up in: 1 year  You will receive a reminder letter in the mail two months in advance. If you don't receive a letter, please call our office to schedule the follow-up appointment.  Your physician recommends that you continue on your current medications as directed. Please refer to the Current Medication list given to you today.  

## 2012-11-20 NOTE — Progress Notes (Signed)
      [address] [place], [place]  [address] Phone: [phone] Fax:  [phone]  Date:  11/20/2012   ID:  Helen Swanson, DOB 1945/02/16, MRN [id number]  PCP:  No primary provider on file.   History of Present Illness: Helen Swanson is a 67 y.o. female post mitral valve repair due to severe mitral regurgitation/prolapse here for followup. Her hospitalization was slightly prolonged do to a bout of pancreatitis. Improved. She is doing well without any complaints of chest pain, fevers, nausea, shortness of breath. Several cruises. She is excited about being on Medicare    Wt Readings from Last 3 Encounters:  11/20/12 104 lb (47.174 kg)     Past Medical History  Diagnosis Date  . Heart murmur   . Mitral regurgitation   . Hypotension   . Osteopenia   . Tachycardia     Past Surgical History  Procedure Laterality Date  . Mitral valve repair      Current Outpatient Prescriptions  Medication Sig Dispense Refill  . Ascorbic Acid (VITAMIN C PO) Take 1 tablet by mouth daily.      Marland Kitchen aspirin 81 MG tablet Take 81 mg by mouth daily.      . Multiple Vitamin (MULTIVITAMIN) capsule Take 1 capsule by mouth daily.      . nitrofurantoin (MACRODANTIN) 100 MG capsule Take 100 mg by mouth daily.       No current facility-administered medications for this visit.    Allergies:    Allergies  Allergen Reactions  . Codeine     NAUSEA    Social History:  The patient  reports that she has never smoked. She does not have any smokeless tobacco history on file. She reports that she does not drink alcohol or use illicit drugs.   ROS:  Please see the history of present illness.   Denies any syncope, bleeding, orthopnea, PND    PHYSICAL EXAM: VS:  BP 124/86  Pulse 93  Ht 5' (1.524 m)  Wt 104 lb (47.174 kg)  BMI 20.31 kg/m2 Well nourished, well developed, in no acute distress HEENT: normal Neck: no JVD Cardiac:  normal S1, S2; RRR; no murmur Lungs:  clear to  auscultation bilaterally, no wheezing, rhonchi or rales Abd: soft, nontender, no hepatomegaly Ext: no edema Skin: warm and dry Neuro: no focal abnormalities noted  EKG:  Normal rhythm, 95, left axis deviation, no change from prior EKG    ASSESSMENT AND PLAN:  1. Status post mitral valve repair-doing well. No other changes may.  Signed, Donato Schultz, MD [facility]  11/20/2012 11:49 AM

## 2012-12-18 ENCOUNTER — Other Ambulatory Visit: Payer: Self-pay | Admitting: Obstetrics & Gynecology

## 2012-12-18 DIAGNOSIS — Z1231 Encounter for screening mammogram for malignant neoplasm of breast: Secondary | ICD-10-CM

## 2013-02-05 ENCOUNTER — Ambulatory Visit (HOSPITAL_COMMUNITY)
Admission: RE | Admit: 2013-02-05 | Discharge: 2013-02-05 | Disposition: A | Discharge status: Home / Self Care | Payer: Medicare Other | Source: Ambulatory Visit | Attending: Obstetrics & Gynecology | Admitting: Obstetrics & Gynecology

## 2013-02-05 DIAGNOSIS — Z1231 Encounter for screening mammogram for malignant neoplasm of breast: Secondary | ICD-10-CM | POA: Insufficient documentation

## 2013-04-02 ENCOUNTER — Encounter: Payer: Self-pay | Admitting: Obstetrics & Gynecology

## 2013-04-02 ENCOUNTER — Ambulatory Visit: Payer: Self-pay | Admitting: Obstetrics & Gynecology

## 2013-04-03 ENCOUNTER — Ambulatory Visit (INDEPENDENT_AMBULATORY_CARE_PROVIDER_SITE_OTHER): Payer: Medicare Other | Admitting: Obstetrics & Gynecology

## 2013-04-03 ENCOUNTER — Encounter: Payer: Self-pay | Admitting: Obstetrics & Gynecology

## 2013-04-03 VITALS — BP 120/82 | HR 60 | Resp 16 | Ht 60.25 in | Wt 103.4 lb

## 2013-04-03 DIAGNOSIS — R6889 Other general symptoms and signs: Secondary | ICD-10-CM

## 2013-04-03 DIAGNOSIS — R899 Unspecified abnormal finding in specimens from other organs, systems and tissues: Secondary | ICD-10-CM

## 2013-04-03 DIAGNOSIS — Z01419 Encounter for gynecological examination (general) (routine) without abnormal findings: Secondary | ICD-10-CM

## 2013-04-03 DIAGNOSIS — Z0000 Encounter for general adult medical examination without abnormal findings: Secondary | ICD-10-CM

## 2013-04-03 LAB — HEMOGLOBIN, FINGERSTICK: Hemoglobin, fingerstick: 15.9 g/dL (ref 12.0–16.0)

## 2013-04-03 NOTE — Progress Notes (Signed)
68 y.o. [name] [name] here for annual exam.  No vaginal bleeding.  Doing really well.    Went two back to back 10 day [place] cruises in January.  Going on 21 [place] this summer.    Patient's last menstrual period was 01/02/1989.          Sexually active: no  The current method of family planning is status post hysterectomy.    Exercising: yes  walking Smoker:  no  Health Maintenance: Pap:  09/19/99 WNL History of abnormal Pap:  no MMG:  02/05/13 3D-normal Colonoscopy:  2007-repeat in 10 years BMD:   2013 TDaP:  2008 Screening Labs: today, Hb today: not done, Urine today: urologist    reports that she has never smoked. She has never used smokeless tobacco. She reports that she does not drink alcohol or use illicit drugs.  Past Medical History  Diagnosis Date  . Heart murmur   . Mitral regurgitation   . Hypotension   . Osteopenia   . Tachycardia   . MVP (mitral valve prolapse)     with severe mitral regurgitation   . IC (interstitial cystitis)     Past Surgical History  Procedure Laterality Date  . Mitral valve repair    . Dilation and curettage of uterus      x2  . Total abdominal hysterectomy w/ bilateral salpingoophorectomy    . Toe surgery    . Shoulder surgery      right  . Wrist surgery      left 10/06, right 7/07  . Elbow surgery      right elbow for tendonitis    Current Outpatient Prescriptions  Medication Sig Dispense Refill  . Ascorbic Acid (VITAMIN C PO) Take 1 tablet by mouth daily.      Marland Kitchen. aspirin 81 MG tablet Take 81 mg by mouth daily.      . Multiple Vitamin (MULTIVITAMIN) capsule Take 1 capsule by mouth daily.      . nitrofurantoin, macrocrystal-monohydrate, (MACROBID) 100 MG capsule       . ciprofloxacin (CIPRO) 250 MG tablet        No current facility-administered medications for this visit.    Family History  Problem Relation Age of Onset  . Diabetes Mother   . CVA Father     mild MI  . Hiatal hernia Father      ROS:  Pertinent items are noted in HPI.  Otherwise, a comprehensive ROS was negative.  Exam:   BP 120/82  Pulse 60  Resp 16  Ht 5' 0.25" (1.53 m)  Wt 103 lb 6.4 oz (46.902 kg)  BMI 20.04 kg/m2  LMP 01/02/1989  Weight change: stable  Height: 5' 0.25" (153 cm)  Ht Readings from Last 3 Encounters:  04/03/13 5' 0.25" (1.53 m)  11/20/12 5' (1.524 m)    General appearance: alert, cooperative and appears stated age Head: Normocephalic, without obvious abnormality, atraumatic Neck: no adenopathy, supple, symmetrical, trachea midline and thyroid normal to inspection and palpation Lungs: clear to auscultation bilaterally Breasts: normal appearance, no masses or tenderness Heart: regular rate and rhythm Abdomen: soft, non-tender; bowel sounds normal; no masses,  no organomegaly Extremities: extremities normal, atraumatic, no cyanosis or edema Skin: Skin color, texture, turgor normal. No rashes or lesions Lymph nodes: Cervical, supraclavicular, and axillary nodes normal. No abnormal inguinal nodes palpated Neurologic: Grossly normal   Pelvic: External genitalia:  no lesions  Urethra:  normal appearing urethra with no masses, tenderness or lesions              Bartholins and Skenes: normal                 Vagina: normal appearing vagina with normal color and discharge, no lesions              Cervix: absent              Pap taken: no Bimanual Exam:  Uterus:  uterus absent              Adnexa: no mass, fullness, tenderness               Rectovaginal: Confirms               Anus:  normal sphincter tone, no lesions  A:  Well Woman with normal exam H/O IC and recurrent UTI-type symptoms with negative cultures.  Seeing Dr. Sherron Monday yearly, now. H/O mitral valve repair 1/12 Vaginal atrophy Osteopenia  P:   Mammogram yearly pap smear not indicated Labs today.  CMP, Vit D, TSH, Lipids BMD due.  Will schedule for pt. return annually or prn  An After Visit Summary was  printed and given to the patient.

## 2013-04-03 NOTE — Patient Instructions (Addendum)

## 2013-04-04 ENCOUNTER — Ambulatory Visit: Payer: Self-pay | Admitting: Obstetrics & Gynecology

## 2013-04-07 LAB — LIPID PANEL
CHOL/HDL RATIO: 3.8 ratio
Cholesterol: 219 mg/dL — ABNORMAL HIGH (ref 0–200)
HDL: 57 mg/dL (ref >39)
LDL Cholesterol: 130 mg/dL — ABNORMAL HIGH (ref 0–99)
Triglycerides: 160 mg/dL — ABNORMAL HIGH (ref <150)
VLDL: 32 mg/dL (ref 0–40)

## 2013-04-07 LAB — COMPREHENSIVE METABOLIC PANEL
ALBUMIN: 4.4 g/dL (ref 3.5–5.2)
ALK PHOS: 89 U/L (ref 39–117)
ALT: 22 U/L (ref 0–35)
AST: 27 U/L (ref 0–37)
BUN: 17 mg/dL (ref 6–23)
CO2: 28 mEq/L (ref 19–32)
Calcium: 10.3 mg/dL (ref 8.4–10.5)
Chloride: 105 mEq/L (ref 96–112)
Creat: 0.85 mg/dL (ref 0.50–1.10)
Glucose, Bld: 86 mg/dL (ref 70–99)
POTASSIUM: 6.1 meq/L — AB (ref 3.5–5.3)
SODIUM: 144 meq/L (ref 135–145)
TOTAL PROTEIN: 7.2 g/dL (ref 6.0–8.3)
Total Bilirubin: 0.4 mg/dL (ref 0.2–1.2)

## 2013-04-07 LAB — TSH: TSH: 3.164 u[IU]/mL (ref 0.350–4.500)

## 2013-04-08 LAB — VITAMIN D 25 HYDROXY (VIT D DEFICIENCY, FRACTURES): Vit D, 25-Hydroxy: 57 ng/mL (ref 30–89)

## 2013-04-09 NOTE — Addendum Note (Signed)
Addended by: Jerene BearsMILLER, Reign Dziuba S on: 04/09/2013 06:27 AM   Modules accepted: Orders

## 2013-04-11 ENCOUNTER — Other Ambulatory Visit: Payer: Self-pay | Admitting: Obstetrics & Gynecology

## 2013-04-11 ENCOUNTER — Telehealth: Payer: Self-pay

## 2013-04-11 DIAGNOSIS — M8580 Other specified disorders of bone density and structure, unspecified site: Secondary | ICD-10-CM

## 2013-04-11 DIAGNOSIS — Z780 Asymptomatic menopausal state: Secondary | ICD-10-CM

## 2013-04-11 NOTE — Telephone Encounter (Signed)
Pt calling [facility] with kelly and [facility] Called patient and made her aware.

## 2013-04-11 NOTE — Telephone Encounter (Signed)
Per ROI form ok to leave detailed message on home [phone]. Left message that her BMD has been scheduled for 04/14/13 at 9:15, be there at 9:00//kn

## 2013-04-14 ENCOUNTER — Ambulatory Visit (HOSPITAL_COMMUNITY)
Admission: RE | Admit: 2013-04-14 | Discharge: 2013-04-14 | Disposition: A | Discharge status: Home / Self Care | Payer: Medicare Other | Source: Ambulatory Visit | Attending: Obstetrics & Gynecology | Admitting: Obstetrics & Gynecology

## 2013-04-14 DIAGNOSIS — Z780 Asymptomatic menopausal state: Secondary | ICD-10-CM | POA: Insufficient documentation

## 2013-04-14 DIAGNOSIS — M8580 Other specified disorders of bone density and structure, unspecified site: Secondary | ICD-10-CM

## 2013-04-14 DIAGNOSIS — Z13820 Encounter for screening for osteoporosis: Secondary | ICD-10-CM | POA: Insufficient documentation

## 2013-04-14 DIAGNOSIS — M949 Disorder of cartilage, unspecified: Secondary | ICD-10-CM

## 2013-04-14 DIAGNOSIS — M899 Disorder of bone, unspecified: Secondary | ICD-10-CM | POA: Insufficient documentation

## 2013-04-14 NOTE — Telephone Encounter (Signed)
Katie at [facility]. [facility] and [facility] need the order sent ASAP.

## 2013-04-14 NOTE — Telephone Encounter (Signed)
Spoke with Florentina AddisonKatie, order entered and is aware patient is waiting.   Routing to provider for final review. Patient agreeable to disposition. Will close encounter

## 2013-04-16 ENCOUNTER — Other Ambulatory Visit (INDEPENDENT_AMBULATORY_CARE_PROVIDER_SITE_OTHER): Payer: Medicare Other

## 2013-04-16 DIAGNOSIS — R6889 Other general symptoms and signs: Secondary | ICD-10-CM

## 2013-04-16 DIAGNOSIS — R899 Unspecified abnormal finding in specimens from other organs, systems and tissues: Secondary | ICD-10-CM

## 2013-04-16 LAB — POTASSIUM: POTASSIUM: 6 meq/L — AB (ref 3.5–5.3)

## 2013-04-22 ENCOUNTER — Telehealth: Payer: Self-pay

## 2013-04-22 NOTE — Telephone Encounter (Signed)
Lmtcb//kn 

## 2013-04-22 NOTE — Telephone Encounter (Signed)
Patient is calling kelly back °

## 2013-04-22 NOTE — Telephone Encounter (Signed)
Message copied by Elisha HeadlandNIX, Jacia Sickman S on Tue Apr 22, 2013  9:45 AM ------      Message from: Jerene BearsMILLER, MARY S      Created: Mon Apr 21, 2013 12:03 PM       Tresa EndoKelly,      Please call Ms. Chase PicketHerman and let her know her potassium is still elevated at 6.0.  I don't think there is a problem but this needs to be followed.  She does have a cardiologist.  Does she want to follow up with him?  We can call and make appt?  Does she want to see a PCP.  I don't think she has one, though.            Dr. Hyacinth MeekerMiller ------

## 2013-05-27 NOTE — Telephone Encounter (Signed)
Patient notified of all results-faxed to Dr Leatha Gilding

## 2013-11-23 ENCOUNTER — Emergency Department (HOSPITAL_COMMUNITY): Payer: Medicare Other

## 2013-11-23 ENCOUNTER — Emergency Department (HOSPITAL_COMMUNITY)
Admission: EM | Admit: 2013-11-23 | Discharge: 2013-11-23 | Disposition: A | Discharge status: Home / Self Care | Payer: Medicare Other | Attending: Emergency Medicine | Admitting: Emergency Medicine

## 2013-11-23 ENCOUNTER — Encounter (HOSPITAL_COMMUNITY): Payer: Self-pay

## 2013-11-23 DIAGNOSIS — R110 Nausea: Secondary | ICD-10-CM | POA: Diagnosis not present

## 2013-11-23 DIAGNOSIS — Z79899 Other long term (current) drug therapy: Secondary | ICD-10-CM | POA: Insufficient documentation

## 2013-11-23 DIAGNOSIS — Z792 Long term (current) use of antibiotics: Secondary | ICD-10-CM | POA: Insufficient documentation

## 2013-11-23 DIAGNOSIS — Z8679 Personal history of other diseases of the circulatory system: Secondary | ICD-10-CM | POA: Diagnosis not present

## 2013-11-23 DIAGNOSIS — R51 Headache: Secondary | ICD-10-CM | POA: Diagnosis not present

## 2013-11-23 DIAGNOSIS — R011 Cardiac murmur, unspecified: Secondary | ICD-10-CM | POA: Diagnosis not present

## 2013-11-23 DIAGNOSIS — Z8739 Personal history of other diseases of the musculoskeletal system and connective tissue: Secondary | ICD-10-CM | POA: Diagnosis not present

## 2013-11-23 DIAGNOSIS — Z7982 Long term (current) use of aspirin: Secondary | ICD-10-CM | POA: Diagnosis not present

## 2013-11-23 DIAGNOSIS — R000 Tachycardia, unspecified: Secondary | ICD-10-CM | POA: Diagnosis not present

## 2013-11-23 DIAGNOSIS — R197 Diarrhea, unspecified: Secondary | ICD-10-CM | POA: Insufficient documentation

## 2013-11-23 DIAGNOSIS — G4489 Other headache syndrome: Secondary | ICD-10-CM

## 2013-11-23 LAB — BASIC METABOLIC PANEL
ANION GAP: 17 — AB (ref 5–15)
BUN: 17 mg/dL (ref 6–23)
CHLORIDE: 100 meq/L (ref 96–112)
CO2: 20 meq/L (ref 19–32)
Calcium: 9.3 mg/dL (ref 8.4–10.5)
Creatinine, Ser: 0.72 mg/dL (ref 0.50–1.10)
GFR calc Af Amer: >90 mL/min (ref >90)
GFR calc non Af Amer: 88 mL/min — ABNORMAL LOW (ref >90)
GLUCOSE: 143 mg/dL — AB (ref 70–99)
POTASSIUM: 4 meq/L (ref 3.7–5.3)
SODIUM: 137 meq/L (ref 137–147)

## 2013-11-23 LAB — CBC
HCT: 44 % (ref 36.0–46.0)
HEMOGLOBIN: 14.8 g/dL (ref 12.0–15.0)
MCH: 29.1 pg (ref 26.0–34.0)
MCHC: 33.6 g/dL (ref 30.0–36.0)
MCV: 86.6 fL (ref 78.0–100.0)
Platelets: 301 10*3/uL (ref 150–400)
RBC: 5.08 MIL/uL (ref 3.87–5.11)
RDW: 14.1 % (ref 11.5–15.5)
WBC: 10 10*3/uL (ref 4.0–10.5)

## 2013-11-23 MED ORDER — METOCLOPRAMIDE HCL 5 MG/ML IJ SOLN
10.0000 mg | Freq: Once | INTRAMUSCULAR | Status: AC
  Administered 2013-11-23: 10 mg via INTRAVENOUS
  Filled 2013-11-23: qty 2

## 2013-11-23 MED ORDER — ONDANSETRON HCL 4 MG PO TABS: 4.0000 mg | ORAL_TABLET | Freq: Four times a day (QID) | ORAL | Status: DC

## 2013-11-23 MED ORDER — MORPHINE SULFATE 4 MG/ML IJ SOLN
4.0000 mg | Freq: Once | INTRAMUSCULAR | Status: AC
  Administered 2013-11-23: 4 mg via INTRAVENOUS
  Filled 2013-11-23: qty 1

## 2013-11-23 MED ORDER — DIPHENHYDRAMINE HCL 50 MG/ML IJ SOLN
25.0000 mg | Freq: Once | INTRAMUSCULAR | Status: AC
  Administered 2013-11-23: 25 mg via INTRAVENOUS
  Filled 2013-11-23: qty 1

## 2013-11-23 MED ORDER — SODIUM CHLORIDE 0.9 % IV BOLUS (SEPSIS)
1000.0000 mL | Freq: Once | INTRAVENOUS | Status: AC
  Administered 2013-11-23: 1000 mL via INTRAVENOUS

## 2013-11-23 MED ORDER — KETOROLAC TROMETHAMINE 30 MG/ML IJ SOLN
30.0000 mg | Freq: Once | INTRAMUSCULAR | Status: AC
  Administered 2013-11-23: 30 mg via INTRAVENOUS
  Filled 2013-11-23: qty 1

## 2013-11-23 NOTE — ED Provider Notes (Signed)
CSN: [id number]     Arrival date & time 11/23/13  1103 History   First MD Initiated Contact with Patient 11/23/13 1152     Chief Complaint  Patient presents with  . Headache  . Diarrhea  . Nausea     (Consider location/radiation/quality/duration/timing/severity/associated sxs/prior Treatment) HPI    Helen Swanson is a(n) 68 y.o. female who presents for cc of headache, nausea, and diarrhea. The patient states that she has had a a frontal headache for the past day which has been progressively worsening. She is taken thousand milligrams of Tylenol without relief. She states that the headache is moderate to severe. She denies any radiation, neck stiffness, recent procedures to her back, focal neurologic deficits. Patient denies thunderclap onset. She has associated nausea without vomiting and has had several loose stools without melena, hematochezia, abdominal pain, or mucus production.  Past Medical History  Diagnosis Date  . Heart murmur   . Mitral regurgitation   . Hypotension   . Osteopenia   . Tachycardia   . MVP (mitral valve prolapse)     with severe mitral regurgitation   . IC (interstitial cystitis)    Past Surgical History  Procedure Laterality Date  . Mitral valve repair  1/12    Dr. Cornelius Moraswen  . Dilation and curettage of uterus      x2  . Total abdominal hysterectomy w/ bilateral salpingoophorectomy    . Toe surgery    . Shoulder surgery  5/04    right  . Wrist surgery      left 10/06, right 7/07  . Elbow surgery      right elbow for tendonitis   Family History  Problem Relation Age of Onset  . Diabetes Mother   . CVA Father     mild MI  . Hiatal hernia Father    History  Substance Use Topics  . Smoking status: Never Smoker   . Smokeless tobacco: Never Used  . Alcohol Use: No   OB History    Gravida Para Term Preterm AB TAB SAB Ectopic Multiple Living   0 0 0 0 0 0 0 0 0 0      Review of Systems  Ten systems reviewed and are negative for acute  change, except as noted in the HPI.    Allergies  Codeine  Home Medications   Prior to Admission medications   Medication Sig Start Date End Date Taking? Authorizing Provider  Ascorbic Acid (VITAMIN C PO) Take 1 tablet by mouth daily.    Historical Provider, MD  aspirin 81 MG tablet Take 81 mg by mouth daily.    Historical Provider, MD  ciprofloxacin (CIPRO) 250 MG tablet  12/27/12   Historical Provider, MD  Multiple Vitamin (MULTIVITAMIN) capsule Take 1 capsule by mouth daily.    Historical Provider, MD  nitrofurantoin, macrocrystal-monohydrate, (MACROBID) 100 MG capsule  02/15/13   Historical Provider, MD   BP 120/83 mmHg  Pulse 98  Temp(Src) 97.7 F (36.5 C) (Oral)  Resp 22  SpO2 98%  LMP 01/02/1989 Physical Exam  Constitutional: She is oriented to person, place, and time. She appears well-developed and well-nourished. No distress.  HENT:  Head: Normocephalic and atraumatic.  Mouth/Throat: Oropharynx is clear and moist.  Eyes: Conjunctivae and EOM are normal. Pupils are equal, round, and reactive to light. No scleral icterus.  No horizontal, vertical or rotational nystagmus  Neck: Normal range of motion. Neck supple.  Full active and passive ROM without pain No midline or paraspinal  tenderness No nuchal rigidity or meningeal signs  Cardiovascular: Normal rate, regular rhythm and intact distal pulses.   Pulmonary/Chest: Effort normal and breath sounds normal. No respiratory distress. She has no wheezes. She has no rales.  Abdominal: Soft. Bowel sounds are normal. There is no tenderness. There is no rebound and no guarding.  Musculoskeletal: Normal range of motion.  Lymphadenopathy:    She has no cervical adenopathy.  Neurological: She is alert and oriented to person, place, and time. She has normal reflexes. No cranial nerve deficit. She exhibits normal muscle tone. Coordination normal.  Mental Status:  Alert, oriented, thought content appropriate. Speech fluent without  evidence of aphasia. Able to follow 2 step commands without difficulty.  Cranial Nerves:  II:  Peripheral visual fields grossly normal, pupils equal, round, reactive to light III,IV, VI: ptosis not present, extra-ocular motions intact bilaterally  V,VII: smile symmetric, facial light touch sensation equal VIII: hearing grossly normal bilaterally  IX,X: gag reflex present  XI: bilateral shoulder shrug equal and strong XII: midline tongue extension  Motor:  5/5 in upper and lower extremities bilaterally including strong and equal grip strength and dorsiflexion/plantar flexion Sensory: Pinprick and light touch normal in all extremities.  Deep Tendon Reflexes: 2+ and symmetric  Cerebellar: normal finger-to-nose with bilateral upper extremities Gait: normal gait and balance CV: distal pulses palpable throughout   Skin: Skin is warm and dry. No rash noted. She is not diaphoretic.  Psychiatric: She has a normal mood and affect. Her behavior is normal. Judgment and thought content normal.  Nursing note and vitals reviewed.   ED Course  Procedures (including critical care time) Labs Review Labs Reviewed  BASIC METABOLIC PANEL - Abnormal; Notable for the following:    Glucose, Bld 143 (*)    GFR calc non Af Amer 88 (*)    Anion gap 17 (*)    All other components within normal limits  CBC    Imaging Review Ct Head Wo Contrast  11/23/2013   CLINICAL DATA:  Very bad headache starting yesterday.  EXAM: CT HEAD WITHOUT CONTRAST  TECHNIQUE: Contiguous axial images were obtained from the base of the skull through the vertex without intravenous contrast.  COMPARISON:  None.  FINDINGS: No acute intracranial abnormality. Specifically, no hemorrhage, hydrocephalus, mass lesion, acute infarction, or significant intracranial injury. No acute calvarial abnormality. Visualized paranasal sinuses and mastoids clear. Orbital soft tissues unremarkable.  IMPRESSION: Negative.   Electronically Signed   By:  Charlett NoseKevin  Dover M.D.   On: 11/23/2013 11:54     EKG Interpretation None      MDM   Final diagnoses:  None    1:03 PM BP 120/83 mmHg  Pulse 98  Temp(Src) 97.7 F (36.5 C) (Oral)  Resp 22  SpO2 98%  LMP 01/02/1989 Patient with headhache. She will receive migraine cocktail and reevaluate.   Pt HA treated and improved while in ED.  Presentation is like pts typical HA and non concerning for [facility], ICH, Meningitis, or temporal arteritis. Pt is afebrile with no focal neuro deficits, nuchal rigidity, or change in vision. Pt is to follow up with PCP to discuss prophylactic medication. Pt verbalizes understanding and is agreeable with plan to dc.      Arthor Captainbigail Ameerah Huffstetler, PA-C 11/23/13 1544  Ethelda ChickMartha K Linker, MD 11/23/13 [phone]

## 2013-11-23 NOTE — ED Notes (Signed)
Patient transported to CT 

## 2013-11-23 NOTE — ED Notes (Signed)
Pt reports headache that started yesterday and has not gotten better.  Pt also reports diarrhea overnight and nausea.  Pt states she had a little saltine cracker and caffeine free diet pepsi this morning and was able to keep it down.

## 2013-11-23 NOTE — Discharge Instructions (Signed)
You are having a headache. No specific cause was found today for your headache. It may have been a migraine or other cause of headache. Stress, anxiety, fatigue, and depression are common triggers for headaches. Your headache today does not appear to be life-threatening or require hospitalization, but often the exact cause of headaches is not determined in the emergency department. Therefore, follow-up with your doctor is very important to find out what may have caused your headache, and whether or not you need any further diagnostic testing or treatment. Sometimes headaches can appear benign (not harmful), but then more serious symptoms can develop which should prompt an immediate re-evaluation by your doctor or the emergency department. SEEK MEDICAL ATTENTION IF: You develop possible problems with medications prescribed.  The medications don't resolve your headache, if it recurs , or if you have multiple episodes of vomiting or can't take fluids. You have a change from the usual headache. RETURN IMMEDIATELY IF you develop a sudden, severe headache or confusion, become poorly responsive or faint, develop a fever above 100.69F or problem breathing, have a change in speech, vision, swallowing, or understanding, or develop new weakness, numbness, tingling, incoordination, or have a seizure.   General Headache Without Cause A headache is pain or discomfort felt around the head or neck area. The specific cause of a headache may not be found. There are many causes and types of headaches. A few common ones are:  Tension headaches.  Migraine headaches.  Cluster headaches.  Chronic daily headaches. HOME CARE INSTRUCTIONS   Keep all follow-up appointments with your caregiver or any specialist referral.  Only take over-the-counter or prescription medicines for pain or discomfort as directed by your caregiver.  Lie down in a dark, quiet room when you have a headache.  Keep a headache journal to find  out what may trigger your migraine headaches. For example, write down:  What you eat and drink.  How much sleep you get.  Any change to your diet or medicines.  Try massage or other relaxation techniques.  Put ice packs or heat on the head and neck. Use these 3 to 4 times per day for 15 to 20 minutes each time, or as needed.  Limit stress.  Sit up straight, and do not tense your muscles.  Quit smoking if you smoke.  Limit alcohol use.  Decrease the amount of caffeine you drink, or stop drinking caffeine.  Eat and sleep on a regular schedule.  Get 7 to 9 hours of sleep, or as recommended by your caregiver.  Keep lights dim if bright lights bother you and make your headaches worse. SEEK MEDICAL CARE IF:   You have problems with the medicines you were prescribed.  Your medicines are not working.  You have a change from the usual headache.  You have nausea or vomiting. SEEK IMMEDIATE MEDICAL CARE IF:   Your headache becomes severe.  You have a fever.  You have a stiff neck.  You have loss of vision.  You have muscular weakness or loss of muscle control.  You start losing your balance or have trouble walking.  You feel faint or pass out.  You have severe symptoms that are different from your first symptoms. MAKE SURE YOU:   Understand these instructions.  Will watch your condition.  Will get help right away if you are not doing well or get worse. Document Released: 12/19/2004 Document Revised: 03/13/2011 Document Reviewed: 01/04/2011 [facility] Patient Information 2015 [place], [place]. This information is not intended  to replace advice given to you by your health care provider. Make sure you discuss any questions you have with your health care provider.   Nausea, Adult Nausea is the feeling that you have an upset stomach or have to vomit. Nausea by itself is not likely a serious concern, but it may be an early sign of more serious medical problems. As nausea  gets worse, it can lead to vomiting. If vomiting develops, there is the risk of dehydration.  CAUSES   Viral infections.  Food poisoning.  Medicines.  Pregnancy.  Motion sickness.  Migraine headaches.  Emotional distress.  Severe pain from any source.  Alcohol intoxication. HOME CARE INSTRUCTIONS  Get plenty of rest.  Ask your caregiver about specific rehydration instructions.  Eat small amounts of food and sip liquids more often.  Take all medicines as told by your caregiver. SEEK MEDICAL CARE IF:  You have not improved after 2 days, or you get worse.  You have a headache. SEEK IMMEDIATE MEDICAL CARE IF:   You have a fever.  You faint.  You keep vomiting or have blood in your vomit.  You are extremely weak or dehydrated.  You have dark or bloody stools.  You have severe chest or abdominal pain. MAKE SURE YOU:  Understand these instructions.  Will watch your condition.  Will get help right away if you are not doing well or get worse. Document Released: 01/27/2004 Document Revised: 09/13/2011 Document Reviewed: 08/31/2010 [facility] Patient Information 2015 [place], [place]. This information is not intended to replace advice given to you by your health care provider. Make sure you discuss any questions you have with your health care provider.

## 2013-11-24 ENCOUNTER — Ambulatory Visit (INDEPENDENT_AMBULATORY_CARE_PROVIDER_SITE_OTHER): Payer: Medicare Other | Admitting: Cardiology

## 2013-11-24 ENCOUNTER — Encounter: Payer: Self-pay | Admitting: Cardiology

## 2013-11-24 VITALS — BP 112/84 | HR 92 | Ht 60.25 in | Wt 108.0 lb

## 2013-11-24 DIAGNOSIS — R251 Tremor, unspecified: Secondary | ICD-10-CM

## 2013-11-24 DIAGNOSIS — Z9889 Other specified postprocedural states: Secondary | ICD-10-CM

## 2013-11-24 DIAGNOSIS — R000 Tachycardia, unspecified: Secondary | ICD-10-CM

## 2013-11-24 DIAGNOSIS — R9431 Abnormal electrocardiogram [ECG] [EKG]: Secondary | ICD-10-CM

## 2013-11-24 DIAGNOSIS — G43009 Migraine without aura, not intractable, without status migrainosus: Secondary | ICD-10-CM

## 2013-11-24 NOTE — Progress Notes (Signed)
[address] [place], [place]  [address] Phone: [phone] Fax:  [phone]) [phone]  Date:  11/24/2013   ID:  Helen Swanson, DOB 12-05-1945, MRN [id number]  PCP:  No PCP Per Patient   History of Present Illness: Helen Swanson is a 68 y.o. female post mitral valve repair due to severe mitral regurgitation/prolapse here for followup.   Had migrane 11/23/13 - went to ER. Was driving back from [place], 4:30 pm started hurting, dead center of [place] thought she was Helen Swanson, nausea, could not eat, pounding all night long. Tried ice. 9:30am called Helen Swanson and went to ER. Morphine helped. Feeling better, head is mildly sore today but she is feeling better. Currently denies any shortness of breath, chest pain, fevers, weakness, trouble writing, bleeding, orthopnea, syncope, fevers, rash.  Her hospitalization, mitral valve repair, was slightly prolonged do to a bout of pancreatitis.     Several cruises.   Dr. Leanora IvanoffSusanne Miller OB GYN noted tremor last April.   Wt Readings from Last 3 Encounters:  11/24/13 108 lb (48.988 kg)  04/03/13 103 lb 6.4 oz (46.902 kg)  11/20/12 104 lb (47.174 kg)     Past Medical History  Diagnosis Date  . Heart murmur   . Mitral regurgitation   . Hypotension   . Osteopenia   . Tachycardia   . MVP (mitral valve prolapse)     with severe mitral regurgitation   . IC (interstitial cystitis)     Past Surgical History  Procedure Laterality Date  . Mitral valve repair  1/12    Dr. Cornelius Moraswen  . Dilation and curettage of uterus      x2  . Total abdominal hysterectomy w/ bilateral salpingoophorectomy    . Toe surgery    . Shoulder surgery  5/04    right  . Wrist surgery      left 10/06, right 7/07  . Elbow surgery      right elbow for tendonitis    Current Outpatient Prescriptions  Medication Sig Dispense Refill  . Ascorbic Acid (VITAMIN C PO) Take 1 tablet by mouth daily.    Marland Kitchen. aspirin 81 MG tablet Take 81 mg by mouth daily.      . calcium carbonate 200 MG capsule Take 250 mg by mouth 2 (two) times daily with a meal.    . ciprofloxacin (CIPRO) 250 MG tablet Take 250 mg by mouth as needed (if macrobid is ineffective).     . Multiple Vitamin (MULTIVITAMIN) capsule Take 1 capsule by mouth daily.    . nitrofurantoin, macrocrystal-monohydrate, (MACROBID) 100 MG capsule Take 100 mg by mouth every other day.     . ondansetron (ZOFRAN) 4 MG tablet Take 1 tablet (4 mg total) by mouth every 6 (six) hours. 12 tablet 0   No current facility-administered medications for this visit.    Allergies:    Allergies  Allergen Reactions  . Codeine     NAUSEA    Social History:  The patient  reports that she has never smoked. She has never used smokeless tobacco. She reports that she does not drink alcohol or use illicit drugs.   ROS:  Please see the history of present illness.   Denies any syncope, bleeding, orthopnea, PND    PHYSICAL EXAM: VS:  BP 112/84 mmHg  Pulse 92  Ht 5' 0.25" (1.53 m)  Wt 108 lb (48.988 kg)  BMI 20.93 kg/m2  LMP 01/02/1989 Well nourished, well developed, in  no acute distress HEENT: normal Neck: no JVD Cardiac:  normal S1, S2; RRR; no murmur Lungs:  clear to auscultation bilaterally, no wheezing, rhonchi or rales Abd: soft, nontender, no hepatomegaly Ext: no edema Skin: warm and dry Neuro: no focal abnormalities, mild tremor noted.   EKG:  11/24/13-sinus rhythm, 92, occasional PVC, deeply inverted T waves V3 through V6. T-wave inversion also noted inferior leads. Prior did not show T-wave inversions. Normal rhythm, 95, left axis deviation, no change from prior EKG    ASSESSMENT AND PLAN:  1. Status post mitral valve repair-doing well. Antibiotic prophylaxis discussed. No other changes made. 2. Abnormal EKG-new-onset deeply inverted T waves noted lateral leads. We will check nuclear stress test, treadmill. She is not having any chest discomfort or shortness of breath however EKG demonstrates a  marked change and is concerning. I wonder if there could be any correlation with her migraine headache that occurred yesterday. Sometimes during strokes, which she did not have based upon her head CT and nonfocal findings, people can have EKG changes such as T-wave inversions. She is feeling much better today. She is not currently taking any QT prolonging agents such as Cipro. 3. New-onset migraine headache-her mother had migraines. This is her first one ever. Excedrin Migraine okay. With her tremor noted as well as new migraine, I will refer her to neurology to discuss these issues. 4. Tremor-I did not notice at last visit. Currently, head shaking tremor. Not having any trouble with writing. She does vaguely remember a few years ago one of her roommates during a [employer] trip mentioning that at the end of the day she noted a slight tremor. She does not really notice this machine is staring at the mirror. I will have neurology evaluate. 5. I will follow-up with stress test, in the meantime, one year follow-up. No recent tachycardia noted.  Signed, Donato SchultzMark Skains, MD [facility]inesFACC  11/24/2013 9:08 AM

## 2013-11-24 NOTE — Patient Instructions (Signed)
The current medical regimen is effective;  continue present plan and medications.  Your physician has requested that you have a  myoview. For further information please visit [email or web address]. Please follow instruction sheet, as given.  You are being referred to Neurology for the evaluation of tremor and migraine.  Follow up in 1 year with Dr. Anne FuSkains.  You will receive a letter in the mail 2 months before you are due.  Please call us when you receive this letter to schedule your follow up appointment.

## 2013-11-25 ENCOUNTER — Inpatient Hospital Stay (HOSPITAL_COMMUNITY)
Admission: EM | Admit: 2013-11-25 | Discharge: 2013-11-27 | DRG: 287 | Disposition: A | Discharge status: Home / Self Care | Payer: Medicare Other | Attending: Cardiology | Admitting: Cardiology

## 2013-11-25 ENCOUNTER — Encounter (HOSPITAL_COMMUNITY): Payer: Self-pay | Admitting: Physical Medicine and Rehabilitation

## 2013-11-25 ENCOUNTER — Encounter (HOSPITAL_COMMUNITY)
Admission: EM | Disposition: A | Discharge status: Home / Self Care | Payer: Self-pay | Source: Home / Self Care | Attending: Cardiology

## 2013-11-25 ENCOUNTER — Telehealth: Payer: Self-pay | Admitting: Neurology

## 2013-11-25 ENCOUNTER — Emergency Department (HOSPITAL_COMMUNITY): Payer: Medicare Other

## 2013-11-25 ENCOUNTER — Ambulatory Visit: Payer: Medicare Other | Admitting: Neurology

## 2013-11-25 DIAGNOSIS — J439 Emphysema, unspecified: Secondary | ICD-10-CM | POA: Diagnosis present

## 2013-11-25 DIAGNOSIS — R7989 Other specified abnormal findings of blood chemistry: Secondary | ICD-10-CM | POA: Diagnosis present

## 2013-11-25 DIAGNOSIS — I214 Non-ST elevation (NSTEMI) myocardial infarction: Secondary | ICD-10-CM | POA: Insufficient documentation

## 2013-11-25 DIAGNOSIS — I25110 Atherosclerotic heart disease of native coronary artery with unstable angina pectoris: Secondary | ICD-10-CM

## 2013-11-25 DIAGNOSIS — Z9889 Other specified postprocedural states: Secondary | ICD-10-CM

## 2013-11-25 DIAGNOSIS — I5181 Takotsubo syndrome: Principal | ICD-10-CM | POA: Diagnosis present

## 2013-11-25 DIAGNOSIS — Z79899 Other long term (current) drug therapy: Secondary | ICD-10-CM | POA: Diagnosis not present

## 2013-11-25 DIAGNOSIS — R945 Abnormal results of liver function studies: Secondary | ICD-10-CM

## 2013-11-25 DIAGNOSIS — R079 Chest pain, unspecified: Secondary | ICD-10-CM

## 2013-11-25 DIAGNOSIS — E785 Hyperlipidemia, unspecified: Secondary | ICD-10-CM | POA: Diagnosis present

## 2013-11-25 DIAGNOSIS — Z7982 Long term (current) use of aspirin: Secondary | ICD-10-CM

## 2013-11-25 DIAGNOSIS — R7309 Other abnormal glucose: Secondary | ICD-10-CM | POA: Diagnosis present

## 2013-11-25 DIAGNOSIS — I959 Hypotension, unspecified: Secondary | ICD-10-CM | POA: Diagnosis not present

## 2013-11-25 HISTORY — DX: Hyperlipidemia, unspecified: E78.5

## 2013-11-25 HISTORY — DX: Tremor, unspecified: R25.1

## 2013-11-25 HISTORY — DX: Takotsubo syndrome: I51.81

## 2013-11-25 HISTORY — DX: Nonspecific elevation of levels of transaminase and lactic acid dehydrogenase (ldh): R74.0

## 2013-11-25 HISTORY — DX: Prediabetes: R73.03

## 2013-11-25 HISTORY — PX: LEFT HEART CATHETERIZATION WITH CORONARY ANGIOGRAM: SHX5451

## 2013-11-25 HISTORY — DX: Elevation of levels of liver transaminase levels: R74.01

## 2013-11-25 HISTORY — DX: Emphysema, unspecified: J43.9

## 2013-11-25 HISTORY — DX: Other supraventricular tachycardia: I47.19

## 2013-11-25 HISTORY — DX: Acute pancreatitis without necrosis or infection, unspecified: K85.90

## 2013-11-25 HISTORY — DX: Supraventricular tachycardia: I47.1

## 2013-11-25 HISTORY — DX: Migraine, unspecified, not intractable, without status migrainosus: G43.909

## 2013-11-25 LAB — COMPREHENSIVE METABOLIC PANEL
ALT: 312 U/L — ABNORMAL HIGH (ref 0–35)
AST: 244 U/L — ABNORMAL HIGH (ref 0–37)
Albumin: 3.7 g/dL (ref 3.5–5.2)
Alkaline Phosphatase: 98 U/L (ref 39–117)
Anion gap: 19 — ABNORMAL HIGH (ref 5–15)
BUN: 19 mg/dL (ref 6–23)
CO2: 20 mEq/L (ref 19–32)
Calcium: 9.3 mg/dL (ref 8.4–10.5)
Chloride: 106 mEq/L (ref 96–112)
Creatinine, Ser: 0.86 mg/dL (ref 0.50–1.10)
GFR calc Af Amer: 80 mL/min — ABNORMAL LOW (ref >90)
GFR calc non Af Amer: 69 mL/min — ABNORMAL LOW (ref >90)
Glucose, Bld: 105 mg/dL — ABNORMAL HIGH (ref 70–99)
Potassium: 4.6 mEq/L (ref 3.7–5.3)
Sodium: 145 mEq/L (ref 137–147)
Total Bilirubin: 0.4 mg/dL (ref 0.3–1.2)
Total Protein: 6.7 g/dL (ref 6.0–8.3)

## 2013-11-25 LAB — CBC WITH DIFFERENTIAL/PLATELET
Basophils Absolute: 0 10*3/uL (ref 0.0–0.1)
Basophils Relative: 0 % (ref 0–1)
Eosinophils Absolute: 0 10*3/uL (ref 0.0–0.7)
Eosinophils Relative: 0 % (ref 0–5)
HCT: 40.4 % (ref 36.0–46.0)
Hemoglobin: 13.5 g/dL (ref 12.0–15.0)
Lymphocytes Relative: 11 % — ABNORMAL LOW (ref 12–46)
Lymphs Abs: 1 10*3/uL (ref 0.7–4.0)
MCH: 29.2 pg (ref 26.0–34.0)
MCHC: 33.4 g/dL (ref 30.0–36.0)
MCV: 87.3 fL (ref 78.0–100.0)
Monocytes Absolute: 0.9 10*3/uL (ref 0.1–1.0)
Monocytes Relative: 11 % (ref 3–12)
Neutro Abs: 6.6 10*3/uL (ref 1.7–7.7)
Neutrophils Relative %: 78 % — ABNORMAL HIGH (ref 43–77)
Platelets: 237 10*3/uL (ref 150–400)
RBC: 4.63 MIL/uL (ref 3.87–5.11)
RDW: 14.3 % (ref 11.5–15.5)
WBC: 8.5 10*3/uL (ref 4.0–10.5)

## 2013-11-25 LAB — I-STAT TROPONIN, ED: Troponin i, poc: 0.75 ng/mL (ref 0.00–0.08)

## 2013-11-25 LAB — TROPONIN I
TROPONIN I: 0.89 ng/mL — AB (ref <0.30)
TROPONIN I: 0.82 ng/mL — AB (ref <0.30)
Troponin I: 1.23 ng/mL (ref <0.30)

## 2013-11-25 LAB — LIPASE, BLOOD: Lipase: 39 U/L (ref 11–59)

## 2013-11-25 LAB — POCT ACTIVATED CLOTTING TIME: Activated Clotting Time: 112 seconds

## 2013-11-25 LAB — MRSA PCR SCREENING: MRSA BY PCR: NEGATIVE

## 2013-11-25 SURGERY — LEFT HEART CATHETERIZATION WITH CORONARY ANGIOGRAM

## 2013-11-25 MED ORDER — SODIUM CHLORIDE 0.9 % IV SOLN: INTRAVENOUS | Status: DC

## 2013-11-25 MED ORDER — SODIUM CHLORIDE 0.9 % IJ SOLN: 3.0000 mL | INTRAMUSCULAR | Status: DC | PRN

## 2013-11-25 MED ORDER — ASPIRIN 300 MG RE SUPP
300.0000 mg | RECTAL | Status: AC
  Filled 2013-11-25: qty 1

## 2013-11-25 MED ORDER — MIDAZOLAM HCL 2 MG/2ML IJ SOLN
INTRAMUSCULAR | Status: AC
  Filled 2013-11-25: qty 2

## 2013-11-25 MED ORDER — ASPIRIN 81 MG PO CHEW
81.0000 mg | CHEWABLE_TABLET | ORAL | Status: AC
  Administered 2013-11-26: 81 mg via ORAL
  Filled 2013-11-25: qty 1

## 2013-11-25 MED ORDER — SODIUM CHLORIDE 0.9 % IV SOLN: 250.0000 mL | INTRAVENOUS | Status: DC | PRN

## 2013-11-25 MED ORDER — FENTANYL CITRATE 0.05 MG/ML IJ SOLN
INTRAMUSCULAR | Status: AC
  Filled 2013-11-25: qty 2

## 2013-11-25 MED ORDER — ONDANSETRON HCL 4 MG/2ML IJ SOLN: 4.0000 mg | Freq: Four times a day (QID) | INTRAMUSCULAR | Status: DC | PRN

## 2013-11-25 MED ORDER — HEPARIN (PORCINE) IN NACL 2-0.9 UNIT/ML-% IJ SOLN
INTRAMUSCULAR | Status: AC
  Filled 2013-11-25: qty 500

## 2013-11-25 MED ORDER — MORPHINE SULFATE 2 MG/ML IJ SOLN
2.0000 mg | Freq: Once | INTRAMUSCULAR | Status: AC
  Administered 2013-11-25: 2 mg via INTRAVENOUS
  Filled 2013-11-25: qty 1

## 2013-11-25 MED ORDER — SODIUM CHLORIDE 0.9 % IJ SOLN
3.0000 mL | Freq: Two times a day (BID) | INTRAMUSCULAR | Status: DC
  Administered 2013-11-25 – 2013-11-26 (×2): 3 mL via INTRAVENOUS

## 2013-11-25 MED ORDER — ASPIRIN 81 MG PO CHEW
243.0000 mg | CHEWABLE_TABLET | Freq: Once | ORAL | Status: AC
  Administered 2013-11-25: 243 mg via ORAL

## 2013-11-25 MED ORDER — LIDOCAINE HCL (PF) 1 % IJ SOLN
INTRAMUSCULAR | Status: AC
  Filled 2013-11-25: qty 30

## 2013-11-25 MED ORDER — HEPARIN (PORCINE) IN NACL 2-0.9 UNIT/ML-% IJ SOLN
INTRAMUSCULAR | Status: AC
  Filled 2013-11-25: qty 1000

## 2013-11-25 MED ORDER — VERAPAMIL HCL 2.5 MG/ML IV SOLN
INTRAVENOUS | Status: AC
  Filled 2013-11-25: qty 2

## 2013-11-25 MED ORDER — HEPARIN (PORCINE) IN NACL 100-0.45 UNIT/ML-% IJ SOLN
700.0000 [IU]/h | INTRAMUSCULAR | Status: DC
  Administered 2013-11-25: 700 [IU]/h via INTRAVENOUS
  Filled 2013-11-25: qty 250

## 2013-11-25 MED ORDER — NITROGLYCERIN IN D5W 200-5 MCG/ML-% IV SOLN
0.0000 ug/min | Freq: Once | INTRAVENOUS | Status: AC
  Administered 2013-11-25: 5 ug/min via INTRAVENOUS
  Filled 2013-11-25: qty 250

## 2013-11-25 MED ORDER — HEPARIN BOLUS VIA INFUSION
3000.0000 [IU] | Freq: Once | INTRAVENOUS | Status: AC
  Administered 2013-11-25: 3000 [IU] via INTRAVENOUS
  Filled 2013-11-25: qty 3000

## 2013-11-25 MED ORDER — ACETAMINOPHEN 325 MG PO TABS: 650.0000 mg | ORAL_TABLET | ORAL | Status: DC | PRN

## 2013-11-25 MED ORDER — NITROGLYCERIN 0.4 MG SL SUBL: 0.4000 mg | SUBLINGUAL_TABLET | SUBLINGUAL | Status: DC | PRN

## 2013-11-25 MED ORDER — NITROGLYCERIN 1 MG/10 ML FOR IR/CATH LAB
INTRA_ARTERIAL | Status: AC
  Filled 2013-11-25: qty 10

## 2013-11-25 MED ORDER — ENOXAPARIN SODIUM 40 MG/0.4ML ~~LOC~~ SOLN
40.0000 mg | SUBCUTANEOUS | Status: DC
Start: 2013-11-26 — End: 2013-11-27
  Administered 2013-11-26 – 2013-11-27 (×2): 40 mg via SUBCUTANEOUS
  Filled 2013-11-25 (×4): qty 0.4

## 2013-11-25 MED ORDER — METOPROLOL TARTRATE 12.5 MG HALF TABLET
12.5000 mg | ORAL_TABLET | Freq: Two times a day (BID) | ORAL | Status: DC
  Administered 2013-11-26 – 2013-11-27 (×3): 12.5 mg via ORAL
  Filled 2013-11-25 (×6): qty 1

## 2013-11-25 MED ORDER — ASPIRIN 81 MG PO CHEW
324.0000 mg | CHEWABLE_TABLET | ORAL | Status: AC
  Filled 2013-11-25: qty 4

## 2013-11-25 MED ORDER — NITROGLYCERIN IN D5W 200-5 MCG/ML-% IV SOLN: 3.0000 ug/min | INTRAVENOUS | Status: DC

## 2013-11-25 MED ORDER — PROMETHAZINE HCL 25 MG/ML IJ SOLN: 12.5000 mg | Freq: Four times a day (QID) | INTRAMUSCULAR | Status: DC | PRN

## 2013-11-25 MED ORDER — ONDANSETRON HCL 4 MG/2ML IJ SOLN
4.0000 mg | Freq: Once | INTRAMUSCULAR | Status: AC
  Administered 2013-11-25: 4 mg via INTRAVENOUS
  Filled 2013-11-25: qty 2

## 2013-11-25 MED ORDER — ASPIRIN EC 81 MG PO TBEC
81.0000 mg | DELAYED_RELEASE_TABLET | Freq: Every day | ORAL | Status: DC
  Administered 2013-11-27: 81 mg via ORAL
  Filled 2013-11-25: qty 1

## 2013-11-25 MED ORDER — ASPIRIN 81 MG PO TABS: 81.0000 mg | ORAL_TABLET | Freq: Every day | ORAL | Status: DC

## 2013-11-25 MED ORDER — ASPIRIN 81 MG PO CHEW
324.0000 mg | CHEWABLE_TABLET | Freq: Once | ORAL | Status: DC
  Filled 2013-11-25: qty 4

## 2013-11-25 NOTE — Progress Notes (Signed)
ANTICOAGULATION CONSULT NOTE - Initial Consult  Pharmacy Consult for Heparin Indication: chest pain/ACS  Allergies  Allergen Reactions  . Codeine     NAUSEA    Patient Measurements: Height: 5' (152.4 cm) Weight: 107 lb (48.535 kg) IBW/kg (Calculated) : 45.5  Vital Signs: Temp: 97.5 F (36.4 C) (11/24 0726) Temp Source: Oral (11/24 0726) BP: 117/75 mmHg (11/24 0825) Pulse Rate: 88 (11/24 0825)  Labs:  Recent Labs  11/23/13 1124 11/25/13 0745  HGB 14.8 13.5  HCT 44.0 40.4  PLT 301 237  CREATININE 0.72 0.86    Estimated Creatinine Clearance: 46.8 mL/min (by C-G formula based on Cr of 0.86).   Medical History: Past Medical History  Diagnosis Date  . Heart murmur   . Mitral regurgitation   . Hypotension   . Osteopenia   . Tachycardia   . MVP (mitral valve prolapse)     with severe mitral regurgitation   . IC (interstitial cystitis)     Assessment: 68 year old female of history of mitral valve repair in 2012 to begin heparin for chest pain  Goal of Therapy:  Heparin level 0.3-0.7 units/ml Monitor platelets by anticoagulation protocol: Yes   Plan:  Heparin 2500 units iv bolus x 1 Heparin drip at 700 units / hr Heparin level in 6 hours Daily heparin level, CBC  Thank you. Okey RegalLisa Slade [place], PharmD [phone]  11/25/2013,8:29 AM

## 2013-11-25 NOTE — Care Management Note (Signed)
    Page 1 of 1   11/25/2013     1:23:32 PM CARE MANAGEMENT NOTE 11/25/2013  Patient:  [name],[name]   Account Number:  [id number]  Date Initiated:  11/25/2013  Documentation initiated by:  Helen Swanson  Subjective/Objective Assessment:   adm w mi     Action/Plan:   lives alone   Anticipated DC Date:  11/27/2013   Anticipated DC Plan:  HOME/SELF CARE         Choice offered to / List presented to:             Status of service:   Medicare Important Message given?   (If response is "NO", the following Medicare IM given date fields will be blank) Date Medicare IM given:   Medicare IM given by:   Date Additional Medicare IM given:   Additional Medicare IM given by:    Discharge Disposition:    Per UR Regulation:  Reviewed for med. necessity/level of care/duration of stay  If discussed at Long Length of Stay Meetings, dates discussed:    Comments:

## 2013-11-25 NOTE — Progress Notes (Addendum)
Site area: rt groin Site Prior to Removal:  Level 0 Pressure Applied For: 20 minutes Manual:   yes Patient Status During Pull:  stable Post Pull Site:  Level 0 Post Pull Instructions Given:  yes Post Pull Pulses Present: yes Dressing Applied:  tegaderm Bedrest begins @ 1555 Comments: no complications. IV saline locked

## 2013-11-25 NOTE — Telephone Encounter (Signed)
Helen Swanson, Pt's friend called to cancel pt's appt for this morning. Pt is at the hospital and will call later to r/s.

## 2013-11-25 NOTE — ED Provider Notes (Signed)
CSN: [id number]     Arrival date & time 11/25/13  [id number] History   First MD Initiated Contact with Patient 11/25/13 [phone]     Chief Complaint  Patient presents with  . Chest Pain  . Shortness of Breath     (Consider location/radiation/quality/duration/timing/severity/associated sxs/prior Treatment) HPI   68 year old female with hx of mitral valve prolapse/severe regurgitation s/p repair presenting with chest pressure. Onset around 2:30 to 245 this morning while laying in bed. Feels like there is a heavy book resting on top of her chest. She feels the same pressure in her neck when she takes a deep breath. No other appreciable exacerbating/relieving factors. No associated nausea, diaphoresis or palpitations. Does having tingling in b/l hands though. This morning the patient was walking her dog along a route she often takes and noticed she was unusually SOB along a stretch with a slight incline. Pt recently seen in ED for HA. This has since resolved. Seen by cardiology yesterday for routine appointment. Noted to have abnormal/changed EKG. She was not having any chest discomfort or dyspnea at that time and set up for stress testing.   Past Medical History  Diagnosis Date  . Heart murmur   . Mitral regurgitation   . Hypotension   . Osteopenia   . Tachycardia   . MVP (mitral valve prolapse)     with severe mitral regurgitation   . IC (interstitial cystitis)    Past Surgical History  Procedure Laterality Date  . Mitral valve repair  1/12    Dr. Cornelius Moraswen  . Dilation and curettage of uterus      x2  . Total abdominal hysterectomy w/ bilateral salpingoophorectomy    . Toe surgery    . Shoulder surgery  5/04    right  . Wrist surgery      left 10/06, right 7/07  . Elbow surgery      right elbow for tendonitis   Family History  Problem Relation Age of Onset  . Diabetes Mother   . CVA Father     mild MI  . Hiatal hernia Father    History  Substance Use Topics  . Smoking status: Never  Smoker   . Smokeless tobacco: Never Used  . Alcohol Use: No   OB History    Gravida Para Term Preterm AB TAB SAB Ectopic Multiple Living   0 0 0 0 0 0 0 0 0 0      Review of Systems  All systems reviewed and negative, other than as noted in HPI.   Allergies  Codeine  Home Medications   Prior to Admission medications   Medication Sig Start Date End Date Taking? Authorizing Provider  Ascorbic Acid (VITAMIN C PO) Take 1 tablet by mouth daily.   Yes Historical Provider, MD  aspirin 81 MG tablet Take 81 mg by mouth daily.   Yes Historical Provider, MD  calcium carbonate 200 MG capsule Take 250 mg by mouth 2 (two) times daily with a meal.   Yes Historical Provider, MD  ciprofloxacin (CIPRO) 250 MG tablet Take 250 mg by mouth as needed (if macrobid is ineffective).  12/27/12  Yes Historical Provider, MD  Multiple Vitamin (MULTIVITAMIN WITH MINERALS) TABS tablet Take 1 tablet by mouth daily.   Yes Historical Provider, MD  nitrofurantoin, macrocrystal-monohydrate, (MACROBID) 100 MG capsule Take 100 mg by mouth every other day.  02/15/13  Yes Historical Provider, MD  Multiple Vitamin (MULTIVITAMIN) capsule Take 1 capsule by mouth daily.    Historical  Provider, MD  ondansetron (ZOFRAN) 4 MG tablet Take 1 tablet (4 mg total) by mouth every 6 (six) hours. 11/23/13   Arthor Captain, PA-C   BP 112/75 mmHg  Pulse 95  Temp(Src) 97.5 F (36.4 C) (Oral)  Resp 20  Ht 5' (1.524 m)  Wt 107 lb (48.535 kg)  BMI 20.90 kg/m2  SpO2 98%  LMP 01/02/1989 Physical Exam  Constitutional: She appears well-developed and well-nourished. No distress.  HENT:  Head: Normocephalic and atraumatic.  Eyes: Conjunctivae are normal. Right eye exhibits no discharge. Left eye exhibits no discharge.  Neck: Neck supple.  Cardiovascular: Normal rate, regular rhythm and normal heart sounds.  Exam reveals no gallop and no friction rub.   No murmur heard. Mostly regular. Occasional ectopy.   Pulmonary/Chest: Effort normal  and breath sounds normal. No respiratory distress.  Abdominal: Soft. She exhibits no distension. There is no tenderness.  Musculoskeletal: She exhibits no edema or tenderness.  Lower extremities symmetric as compared to each other. No calf tenderness. Negative Homan's. No palpable cords.   Neurological: She is alert.  Skin: Skin is warm and dry.  Psychiatric: She has a normal mood and affect. Her behavior is normal. Thought content normal.  Nursing note and vitals reviewed.   ED Course  Procedures (including critical care time)  CRITICAL CARE Performed by: Raeford Razor   Total critical care time: 35 minutes  Critical care time was exclusive of separately billable procedures and treating other patients. Critical care was necessary to treat or prevent imminent or life-threatening deterioration. Critical care was time spent personally by me on the following activities: development of treatment plan with patient and/or surrogate as well as nursing, discussions with consultants, evaluation of patient's response to treatment, examination of patient, obtaining history from patient or surrogate, ordering and performing treatments and interventions, ordering and review of laboratory studies, ordering and review of radiographic studies, pulse oximetry and re-evaluation of patient's condition.  Labs Review Labs Reviewed  CBC WITH DIFFERENTIAL - Abnormal; Notable for the following:    Neutrophils Relative % 78 (*)    Lymphocytes Relative 11 (*)    All other components within normal limits  COMPREHENSIVE METABOLIC PANEL - Abnormal; Notable for the following:    Glucose, Bld 105 (*)    AST 244 (*)    ALT 312 (*)    GFR calc non Af Amer 69 (*)    GFR calc Af Amer 80 (*)    Anion gap 19 (*)    All other components within normal limits  I-STAT TROPOININ, ED - Abnormal; Notable for the following:    Troponin i, poc 0.75 (*)    All other components within normal limits  TROPONIN I  HEPARIN  LEVEL (UNFRACTIONATED)    Imaging Review Dg Chest 2 View  11/25/2013   CLINICAL DATA:  Chest pressure for 1 day  EXAM: CHEST  2 VIEW  COMPARISON:  February 07, 2010  FINDINGS: There is underlying emphysematous change. There is scarring in the right upper lobe and both base regions. There is no frank edema or consolidation. There is prominence of epicardial fat on the right anteriorly, stable. Patient is status post mitral valve replacement. Heart is upper normal in size with pulmonary vascularity within normal limits. No adenopathy. No bone lesions.  IMPRESSION: Areas of lung scarring, stable. Underlying emphysema. No edema or consolidation.   Electronically Signed   By: Bretta Bang M.D.   On: 11/25/2013 08:39   Ct Head Wo Contrast  11/23/2013   CLINICAL DATA:  Very bad headache starting yesterday.  EXAM: CT HEAD WITHOUT CONTRAST  TECHNIQUE: Contiguous axial images were obtained from the base of the skull through the vertex without intravenous contrast.  COMPARISON:  None.  FINDINGS: No acute intracranial abnormality. Specifically, no hemorrhage, hydrocephalus, mass lesion, acute infarction, or significant intracranial injury. No acute calvarial abnormality. Visualized paranasal sinuses and mastoids clear. Orbital soft tissues unremarkable.  IMPRESSION: Negative.   Electronically Signed   By: Charlett NoseKevin  Dover M.D.   On: 11/23/2013 11:54     EKG Interpretation   Date/Time:  Tuesday November 25 2013 07:20:17 EST Ventricular Rate:  92 PR Interval:  132 QRS Duration: 70 QT Interval:  388 QTC Calculation: 479 R Axis:   -101 Text Interpretation:  Sinus rhythm with occasional Premature ventricular  complexes Septal infarct , age undetermined Possible Lateral infarct , age  undetermined Abnormal ECG Confirmed by Jaykub Mackins  MD, Quency Tober ([id number]) on  11/25/2013 7:49:00 AM      MDM   Final diagnoses:  NSTEMI (non-ST elevated myocardial infarction)    67yF with CP. Newly noted EKG changes, but  asymptomatic when initially identified. Now having symptoms of chest pressure, new exertional dyspnea and i-stat troponin is elevated. Will send lab troponin. Today's EKGs showing changes as yesterday although less pronounced. ALT/AST also elevated and of uncertain significance. ASA/heparin. Cardiology consultation.     Raeford RazorStephen Zahir Eisenhour, MD 11/25/13 [phone]

## 2013-11-25 NOTE — H&P (Signed)
Admit date: 11/25/2013 Primary Physician  No PCP Per Patient Primary Cardiologist  Skains  CC: Chest pain  HPI: 68 year old female post mitral valve repair in 2012, no angiographically significant coronary artery disease on catheterization 01/19/2010 prior to surgery who is here with chest pain, elevated troponin, abnormal EKG consistent with non-ST elevation myocardial infarction.  On 11/24/13, I saw her in clinic where she was not complaining of any chest discomfort/shortness of breath or other anginal symptoms. EKG however was concerning with deeply inverted anterior lateral T-wave inversions. A nuclear stress test was set up.   Her current EKG demonstrates less intense T-wave inversion laterally, PVC noted.  Today, she reports the emergency room with sensation this morning at approximately 230 am while lying in bed, heavy book resting on top of her chest, pressure in her neck when she takes in a deep breath with no diaphoresis, palpitations. She was walking her dog this morning and felt unusual shortness of breath with a slight incline. As is stated above, she was recently seen in the emergency department for new onset migraine.  As an aside, ALT and AST were elevated 244, 312.  Aspirin and heparin have been started. Troponin I was 1.23. Anion gap also 17-19, CO2 20.    PMH:   Past Medical History  Diagnosis Date  . Heart murmur   . Mitral regurgitation   . Hypotension   . Osteopenia   . Tachycardia   . MVP (mitral valve prolapse)     with severe mitral regurgitation   . IC (interstitial cystitis)     PSH:   Past Surgical History  Procedure Laterality Date  . Mitral valve repair  1/12    Dr. Cornelius Moraswen  . Dilation and curettage of uterus      x2  . Total abdominal hysterectomy w/ bilateral salpingoophorectomy    . Toe surgery    . Shoulder surgery  5/04    right  . Wrist surgery      left 10/06, right 7/07  . Elbow surgery      right elbow for tendonitis   Allergies:   Codeine Prior to Admit Meds:   Prior to Admission medications   Medication Sig Start Date End Date Taking? Authorizing Provider  Ascorbic Acid (VITAMIN C PO) Take 1 tablet by mouth daily.   Yes Historical Provider, MD  aspirin 81 MG tablet Take 81 mg by mouth daily.   Yes Historical Provider, MD  calcium carbonate 200 MG capsule Take 250 mg by mouth 2 (two) times daily with a meal.   Yes Historical Provider, MD  ciprofloxacin (CIPRO) 250 MG tablet Take 250 mg by mouth as needed (if macrobid is ineffective).  12/27/12  Yes Historical Provider, MD  Multiple Vitamin (MULTIVITAMIN WITH MINERALS) TABS tablet Take 1 tablet by mouth daily.   Yes Historical Provider, MD  nitrofurantoin, macrocrystal-monohydrate, (MACROBID) 100 MG capsule Take 100 mg by mouth every other day.  02/15/13  Yes Historical Provider, MD  Multiple Vitamin (MULTIVITAMIN) capsule Take 1 capsule by mouth daily.    Historical Provider, MD  ondansetron (ZOFRAN) 4 MG tablet Take 1 tablet (4 mg total) by mouth every 6 (six) hours. 11/23/13   Arthor CaptainAbigail Harris, PA-C   Fam HX:    Family History  Problem Relation Age of Onset  . Diabetes Mother   . CVA Father     mild MI  . Hiatal hernia Father    Social HX:    History   Social  History  . Marital Status: Single    Spouse Name: N/A    Number of Children: N/A  . Years of Education: N/A   Occupational History  . Not on file.   Social History Main Topics  . Smoking status: Never Smoker   . Smokeless tobacco: Never Used  . Alcohol Use: No  . Drug Use: No  . Sexual Activity: No     Comment: TAH/BSO   Other Topics Concern  . Not on file   Social History Narrative     ROS:  All 11 ROS were addressed and are negative except what is stated in the HPI   Physical Exam: Blood pressure 117/84, pulse 91, temperature 97.5 F (36.4 C), temperature source Oral, resp. rate 20, height 5' (1.524 m), weight 107 lb (48.535 kg), last menstrual period 01/02/1989, SpO2 94 %.    General: Thin, pleasant, jovial in no acute distress Head: Eyes PERRLA, No xanthomas.   Normal cephalic and atramatic  Lungs:  Clear bilaterally to auscultation and percussion. Normal respiratory effort. No wheezes, no rales. Heart:  HRRR S1 S2 Pulses are 2+ & equal. No murmurs, rubs or gallops.             No carotid bruit. No JVD.  No abdominal bruits. Abdomen: Bowel sounds are positive, abdomen soft and mild tenderness diffusely, epigastric most notable, without masses or  Hernia's noted. No hepatosplenomegaly. Msk:  Back normal, normal gait. Normal strength and tone for age. Extremities:  No clubbing, cyanosis or edema.  DP +1 Neuro: Alert and oriented X 3, non-focal, MAE x 4, minor resting head tremor GU: Deferred Rectal: Deferred Psych:  Good affect, responds appropriately         Labs:   Lab Results  Component Value Date   WBC 8.5 11/25/2013   HGB 13.5 11/25/2013   HCT 40.4 11/25/2013   MCV 87.3 11/25/2013   PLT 237 11/25/2013    Recent Labs Lab 11/25/13 0745  NA 145  K 4.6  CL 106  CO2 20  BUN 19  CREATININE 0.86  CALCIUM 9.3  PROT 6.7  BILITOT 0.4  ALKPHOS 98  ALT 312*  AST 244*  GLUCOSE 105*    Recent Labs  11/25/13 0822  TROPONINI 1.23*   Lab Results  Component Value Date   CHOL 219* 04/03/2013   HDL 57 04/03/2013   LDLCALC 130* 04/03/2013   TRIG 160* 04/03/2013   No results found for: DDIMER   Radiology:  Dg Chest 2 View  11/25/2013   CLINICAL DATA:  Chest pressure for 1 day  EXAM: CHEST  2 VIEW  COMPARISON:  February 07, 2010  FINDINGS: There is underlying emphysematous change. There is scarring in the right upper lobe and both base regions. There is no frank edema or consolidation. There is prominence of epicardial fat on the right anteriorly, stable. Patient is status post mitral valve replacement. Heart is upper normal in size with pulmonary vascularity within normal limits. No adenopathy. No bone lesions.  IMPRESSION: Areas of lung  scarring, stable. Underlying emphysema. No edema or consolidation.   Electronically Signed   By: Bretta BangWilliam  Woodruff M.D.   On: 11/25/2013 08:39   Ct Head Wo Contrast  11/23/2013   CLINICAL DATA:  Very bad headache starting yesterday.  EXAM: CT HEAD WITHOUT CONTRAST  TECHNIQUE: Contiguous axial images were obtained from the base of the skull through the vertex without intravenous contrast.  COMPARISON:  None.  FINDINGS: No acute intracranial abnormality. Specifically,  no hemorrhage, hydrocephalus, mass lesion, acute infarction, or significant intracranial injury. No acute calvarial abnormality. Visualized paranasal sinuses and mastoids clear. Orbital soft tissues unremarkable.  IMPRESSION: Negative.   Electronically Signed   By: Charlett Nose M.D.   On: 11/23/2013 11:54   Personally viewed.   EKG:  11/24/13-yesterday-deeply inverted T waves precordial/lateral leads. Concerning for ischemia. Asymptomatic at the time. 11/25/13-current EKG shows mild T-wave inversion lateral leads, PVC, no ST segment elevation. Personally viewed.  ASSESSMENT/PLAN:   68 year old female status post mitral valve repair in 2011 with previously normal coronaries here with non-ST elevation myocardial infarction, elevated LFTs, recent migraine headache, resting tremor noted.  1. Non-ST elevation myocardial infarction - Cardiac catheterization - IV heparin, nitroglycerin - Continue to trend troponin - Symptom, shortness of breath with exertion this morning while walking dog, chest pressure at 2:30 AM this morning. - EKG from yesterday, deeply inverted T waves precordial region.  2. Prior mitral valve repair - 2011 - No CAD at that time  3. Elevated LFTs - I will add lipase - Prior bout of postoperative pancreatitis in 2011 - Could be elevated in the setting of MI  4. Recent migraine headache - Question correlation with today's event - Has neurology appointment to discuss this as well as more noticeable resting head  tremor.  5. Hyper lipidemia - Start statin when comfortable from elevated liver function standpoint   Donato Schultz, MD  11/25/2013  10:13 AM

## 2013-11-25 NOTE — ED Notes (Signed)
Pt presents to department for evaluation of midsternal chest pain and SOB. Onset last night. 5/10 pain upon arrival, increases with deep breathing. Describes pain as pressure sensation. Respirations unlabored. Pt is alert and oriented x4.

## 2013-11-25 NOTE — Telephone Encounter (Signed)
Marked as a no show because pt called on the day of but a no show letter was not sent. Referring provider's office notified as well via EPIC referral / Sherri S.

## 2013-11-25 NOTE — ED Notes (Signed)
Cardiology at bedside.

## 2013-11-25 NOTE — CV Procedure (Signed)
     Left Heart Catheterization with Coronary Angiography Report  Helen SchleinMargaret A Swanson  68 y.o.  female [birth year]  Procedure Date: 11/25/2013 Referring Physician: Dr. Donato SchultzMark Skains Primary Cardiologist: Dr. Donato SchultzMark Skains  INDICATIONS: Acute coronary syndrome with elevated markers and anterior T-wave abnormality. Vague chest discomfort. Prior history of widely patent coronaries prior to mitral valve repair in 2011. Exclude coronary artery disease development due to plaque rupture versus stress cardiomyopathy.  PROCEDURE: 1. Left heart catheterization; 2. Coronary angiography; 3. Left ventriculography  CONSENT:  The risks, benefits, and details of the procedure were explained in detail to the patient. Risks including death, stroke, heart attack, kidney injury, allergy, limb ischemia, bleeding and radiation injury were discussed.  The patient verbalized understanding and wanted to proceed.  Informed written consent was obtained.  PROCEDURE TECHNIQUE:  After Xylocaine anesthesia a 5 French sheath was placed in the right femoral artery using the modified Seldinger technique.  Coronary angiography was done using a 5 F A2MP catheter.  Left ventriculography was done using the A2 MP catheter and hand injection.   Multiple attempts to cannulate the left radial artery were made prior to the right femoral sheath placement. On 2 occasions we were able to get the artery but was unable to advance the guidewire. We then developed severe spasm. Further attempts to use the radial were terminated and the femoral approach used. We did not attempt to use the right radial approach because of her only IV overlying the access site.  She was adequately sedated during the procedure receiving 3 mg of Versed and 50 g of fentanyl.   CONTRAST:  Total of 55 cc.  COMPLICATIONS:  None   HEMODYNAMICS:  Aortic pressure 94/56 mmHg; LV pressure 94 over 3 mmHg; LVEDP 18 mmHg  ANGIOGRAPHIC DATA:   The left main coronary artery  is normal.  The left anterior descending artery is transapical, large in caliber, and normal..  The left circumflex artery trifurcates into 3 obtuse marginal branches and is normal..  The right coronary artery is dominant and normal..   LEFT VENTRICULOGRAM:  Left ventricular angiogram was done in the 30 RAO projection and revealed mid to distal anterior apical and mid to distal inferior akinesis. Basilar hyperkinesis is noted. Ejection fraction 25%. Findings are compatible with stress cardiomyopathy/Takotsubo CM.   IMPRESSIONS:  1. Angiographically normal coronary arteries 2. Severe left ventricular dysfunction with anterior inferior and apical akinesis with basilar sparing compatible with stress cardiomyopathy/Takotsubo cardiomyopathy. Ejection fraction 25%.   RECOMMENDATION:   Supportive care. ACE inhibitor and beta blocker therapy as tolerated by blood pressure. I have discontinued IV nitroglycerin. Further management per treatment team..

## 2013-11-25 NOTE — Plan of Care (Signed)
Problem: Consults Goal: Cardiac Cath Patient Education (See Patient Education module for education specifics.)  Outcome: Completed/Met Date Met:  11/25/13     

## 2013-11-25 NOTE — ED Notes (Signed)
Troponin results given to Dr. Kohut 

## 2013-11-25 NOTE — Telephone Encounter (Signed)
Clydie BraunKaren, Pt's friend called to cancel pt's appt at 8:07AM on 11/25/13. Pt is at the hospital and will call later to r/s.  I believe this is a ER referral.

## 2013-11-25 NOTE — ED Notes (Signed)
EDP aware of pt's elevated Troponin level from lab.

## 2013-11-25 NOTE — Interval H&P Note (Signed)
Cath Lab Visit (complete for each Cath Lab visit)  Clinical Evaluation Leading to the Procedure:   ACS: Yes.    Non-ACS:    Anginal Classification: CCS III  Anti-ischemic medical therapy: Minimal Therapy (1 class of medications)  Non-Invasive Test Results: No non-invasive testing performed  Prior CABG: No previous CABG      History and Physical Interval Note:  11/25/2013 11:40 AM  Helen Swanson  has presented today for surgery, with the diagnosis of cp  The various methods of treatment have been discussed with the patient and family. After consideration of risks, benefits and other options for treatment, the patient has consented to  Procedure(s): LEFT HEART CATHETERIZATION WITH CORONARY ANGIOGRAM (N/A) as a surgical intervention .  The patient's history has been reviewed, patient examined, no change in status, stable for surgery.  I have reviewed the patient's chart and labs.  Questions were answered to the patient's satisfaction.     Lesleigh NoeSMITH III,HENRY W

## 2013-11-26 ENCOUNTER — Encounter (HOSPITAL_COMMUNITY): Payer: Medicare Other

## 2013-11-26 DIAGNOSIS — J439 Emphysema, unspecified: Secondary | ICD-10-CM

## 2013-11-26 DIAGNOSIS — I369 Nonrheumatic tricuspid valve disorder, unspecified: Secondary | ICD-10-CM

## 2013-11-26 DIAGNOSIS — I5181 Takotsubo syndrome: Principal | ICD-10-CM

## 2013-11-26 LAB — LIPID PANEL
CHOL/HDL RATIO: 3.4 ratio
Cholesterol: 139 mg/dL (ref 0–200)
HDL: 41 mg/dL (ref >39)
LDL CALC: 67 mg/dL (ref 0–99)
TRIGLYCERIDES: 155 mg/dL — AB (ref <150)
VLDL: 31 mg/dL (ref 0–40)

## 2013-11-26 LAB — HEPATITIS PANEL, ACUTE
HCV Ab: NEGATIVE
Hep A IgM: NONREACTIVE
Hep B C IgM: NONREACTIVE
Hepatitis B Surface Ag: NEGATIVE

## 2013-11-26 LAB — CBC
HCT: 35.8 % — ABNORMAL LOW (ref 36.0–46.0)
HEMOGLOBIN: 11.6 g/dL — AB (ref 12.0–15.0)
MCH: 27.8 pg (ref 26.0–34.0)
MCHC: 32.4 g/dL (ref 30.0–36.0)
MCV: 85.6 fL (ref 78.0–100.0)
Platelets: 235 10*3/uL (ref 150–400)
RBC: 4.18 MIL/uL (ref 3.87–5.11)
RDW: 14.5 % (ref 11.5–15.5)
WBC: 7.3 10*3/uL (ref 4.0–10.5)

## 2013-11-26 LAB — COMPREHENSIVE METABOLIC PANEL
ALT: 212 U/L — AB (ref 0–35)
AST: 109 U/L — ABNORMAL HIGH (ref 0–37)
Albumin: 3 g/dL — ABNORMAL LOW (ref 3.5–5.2)
Alkaline Phosphatase: 95 U/L (ref 39–117)
Anion gap: 15 (ref 5–15)
BUN: 19 mg/dL (ref 6–23)
CALCIUM: 8.6 mg/dL (ref 8.4–10.5)
CO2: 21 meq/L (ref 19–32)
Chloride: 104 mEq/L (ref 96–112)
Creatinine, Ser: 0.91 mg/dL (ref 0.50–1.10)
GFR, EST AFRICAN AMERICAN: 75 mL/min — AB (ref >90)
GFR, EST NON AFRICAN AMERICAN: 65 mL/min — AB (ref >90)
GLUCOSE: 98 mg/dL (ref 70–99)
Potassium: 4.2 mEq/L (ref 3.7–5.3)
Sodium: 140 mEq/L (ref 137–147)
Total Bilirubin: <0.2 mg/dL — ABNORMAL LOW (ref 0.3–1.2)
Total Protein: 5.9 g/dL — ABNORMAL LOW (ref 6.0–8.3)

## 2013-11-26 LAB — HEMOGLOBIN A1C
Hgb A1c MFr Bld: 5.7 % — ABNORMAL HIGH (ref <5.7)
Mean Plasma Glucose: 117 mg/dL — ABNORMAL HIGH (ref <117)

## 2013-11-26 MED ORDER — PERFLUTREN LIPID MICROSPHERE
INTRAVENOUS | Status: AC
  Administered 2013-11-26: 1 mL
  Filled 2013-11-26: qty 10

## 2013-11-26 MED ORDER — LISINOPRIL 2.5 MG PO TABS
2.5000 mg | ORAL_TABLET | Freq: Every day | ORAL | Status: DC
  Administered 2013-11-26 – 2013-11-27 (×2): 2.5 mg via ORAL
  Filled 2013-11-26 (×3): qty 1

## 2013-11-26 MED ORDER — PERFLUTREN LIPID MICROSPHERE
1.0000 mL | INTRAVENOUS | Status: AC | PRN
  Filled 2013-11-26: qty 10

## 2013-11-26 NOTE — Progress Notes (Signed)
CARDIAC REHAB PHASE I   PRE:  Rate/Rhythm: 78 SR    BP: sitting 103/73    SaO2:   MODE:  Ambulation: 390 ft   POST:  Rate/Rhythm: 93 SR    BP: sitting 111/64     SaO2:   Pt weak, a bit shaky. Handheld assist. Stated she felt a bit stronger toward end. Has been getting up in room. Return to recliner, VSS. Ed completed with good comprehension. Encouraged slowly increasing walking and low sodium diet. Friend coming to stay with her. She will call [facility] if she does not improve quickly (did program after valve).  [id number]   Helen Swanson, Helen Swanson CES, ACSM 11/26/2013 11:52 AM

## 2013-11-26 NOTE — Plan of Care (Signed)
Problem: Phase III Progression Outcomes Goal: Hemodynamically stable Outcome: Completed/Met Date Met:  11/26/13 Goal: No anginal pain Outcome: Completed/Met Date Met:  11/26/13 Goal: Cath/PCI Path as indicated Outcome: Completed/Met Date Met:  11/26/13 Goal: Vascular site scale level 0 - I Vascular Site Scale Level 0: No bruising/bleeding/hematoma Level I (Mild): Bruising/Ecchymosis, minimal bleeding/ooozing, palpable hematoma < 3 cm Level II (Moderate): Bleeding not affecting hemodynamic parameters, pseudoaneurysm, palpable hematoma > 3 cm Level III (Severe) Bleeding which affects hemodynamic parameters or retroperitoneal hemorrhage  Outcome: Completed/Met Date Met:  11/26/13 Goal: Tolerating diet Outcome: Completed/Met Date Met:  11/26/13

## 2013-11-26 NOTE — Progress Notes (Signed)
SUBJECTIVE:  Pt seen and examined in AM. No acute events overnight. Telemetry reveals NSR. She is s/p non-obstructive cardiac catherization with reduced EF 25% and anterior inferior/apical kinesis consistent with stress cardiomyopathy. Her last troponin level was 0.82. She denies any chest pain, dyspnea, or LE swelling.   OBJECTIVE:   Vitals:   Filed Vitals:   11/25/13 2351 11/26/13 0000 11/26/13 0354 11/26/13 0400  BP:  88/67  115/85  Pulse:      Temp: 99.1 F (37.3 C)  98.7 F (37.1 C) 98.7 F (37.1 C)  TempSrc: Oral  Oral Oral  Resp:  18  19  Height:      Weight:    105 lb 14.4 oz (48.036 kg)  SpO2:  91%  99%   I&O's:   Intake/Output Summary (Last 24 hours) at 11/26/13 0818 Last data filed at 11/25/13 2155  Gross per 24 hour  Intake    243 ml  Output      0 ml  Net    243 ml   TELEMETRY: Reviewed telemetry pt in NSR:     PHYSICAL EXAM General: Well developed, well nourished, in no acute distress Head:   Normal cephalic and atramatic  Lungs:   Increased breath sound b/l Heart:   HRRR S1 S2  Holosystolic murmur.   Abdomen: Abdomen soft and non-tender Msk:  Back normal,  Normal strength and tone for age. Extremities:   No edema.   Neuro: Alert and oriented. Psych:  Normal affect, responds appropriately Skin: No rash   LABS: Basic Metabolic Panel:  Recent Labs  81/19/1411/24/15 0745 11/26/13 0320  NA 145 140  K 4.6 4.2  CL 106 104  CO2 20 21  GLUCOSE 105* 98  BUN 19 19  CREATININE 0.86 0.91  CALCIUM 9.3 8.6   Liver Function Tests:  Recent Labs  11/25/13 0745 11/26/13 0320  AST 244* 109*  ALT 312* 212*  ALKPHOS 98 95  BILITOT 0.4 <0.2*  PROT 6.7 5.9*  ALBUMIN 3.7 3.0*    Recent Labs  11/25/13 0820  LIPASE 39   CBC:  Recent Labs  11/25/13 0745 11/26/13 0320  WBC 8.5 7.3  NEUTROABS 6.6  --   HGB 13.5 11.6*  HCT 40.4 35.8*  MCV 87.3 85.6  PLT 237 235   Cardiac Enzymes:  Recent Labs  11/25/13 0822 11/25/13 1816 11/25/13 2314    TROPONINI 1.23* 0.89* 0.82*   BNP: Invalid input(s): POCBNP D-Dimer: No results for input(s): DDIMER in the last 72 hours. Hemoglobin A1C:  Recent Labs  11/25/13 1816  HGBA1C 5.7*   Fasting Lipid Panel:  Recent Labs  11/26/13 0320  CHOL 139  HDL 41  LDLCALC 67  TRIG 155*  CHOLHDL 3.4   Thyroid Function Tests: No results for input(s): TSH, T4TOTAL, T3FREE, THYROIDAB in the last 72 hours.  Invalid input(s): FREET3 Anemia Panel: No results for input(s): VITAMINB12, FOLATE, FERRITIN, TIBC, IRON, RETICCTPCT in the last 72 hours. Coag Panel:   Lab Results  Component Value Date   INR 1.44 02/02/2010   INR 1.48 02/01/2010   INR 1.48 01/31/2010    RADIOLOGY: Dg Chest 2 View  11/25/2013   CLINICAL DATA:  Chest pressure for 1 day  EXAM: CHEST  2 VIEW  COMPARISON:  February 07, 2010  FINDINGS: There is underlying emphysematous change. There is scarring in the right upper lobe and both base regions. There is no frank edema or consolidation. There is prominence of epicardial fat on the  right anteriorly, stable. Patient is status post mitral valve replacement. Heart is upper normal in size with pulmonary vascularity within normal limits. No adenopathy. No bone lesions.  IMPRESSION: Areas of lung scarring, stable. Underlying emphysema. No edema or consolidation.   Electronically Signed   By: Bretta BangWilliam  Woodruff M.D.   On: 11/25/2013 08:39   Ct Head Wo Contrast  11/23/2013   CLINICAL DATA:  Very bad headache starting yesterday.  EXAM: CT HEAD WITHOUT CONTRAST  TECHNIQUE: Contiguous axial images were obtained from the base of the skull through the vertex without intravenous contrast.  COMPARISON:  None.  FINDINGS: No acute intracranial abnormality. Specifically, no hemorrhage, hydrocephalus, mass lesion, acute infarction, or significant intracranial injury. No acute calvarial abnormality. Visualized paranasal sinuses and mastoids clear. Orbital soft tissues unremarkable.  IMPRESSION:  Negative.   Electronically Signed   By: Charlett NoseKevin  Dover M.D.   On: 11/23/2013 11:54   Principal Problem:   Stress-induced cardiomyopathy Active Problems:   History of mitral valve repair   Elevated LFTs   Hyperlipidemia   Emphysema lung   ASSESSMENT: 68 yo woman with pmh of mitral valve repair in 2012 secondary to mitral regurgitation from MVP who presented with CP, elevated cardiac enzymes, and EKG changes found to have non-obstructive cardiac catherization with reduced EF 25% and anterior inferior/apical kinesis consistent with stress cardiomyopathy.   PLAN:    Stress Cardiomyopathy  - Currently CP-free with last troponin level 0.82. Currently on aspirin 81 mg daily and lopressor 12.5 BID (consider transition to metoprolol succinate). Will start low-dose lisinopril 2.5 mg daily due to soft blood pressures.  2D-echo today. Continue phase 1 cardiac rehab. Pt to follow-up as outpatient in 1-2 weeks.  History of Mitral Valve Repair - Plan for 2D-echo today. Pt with mitral valve repair in 2012 secondary to mitral regurgitation from MVP  Hyperlipidemia - LDL improved to 67 from 130. Pt not on statin therapy at home.  AST/ALT continue to be elevated. Pre-Diabetes - A1c 5.7 with impaired fasting glucose. Counsel on lifestyle modification. Continue to monitor as outpatient. Chronic Transaminitis - AST/ALT 244/312 improved to 109/212.  Acute hepatitis panel pending. Last CT abd imaging in 2012 with no liver abnormality. Consider checking alpha-antitrypsin level in setting of emphysema. Continue to monitor as outpatient. Emphysema - Apparent on CXR. Pt not current or former smoker. Consider checking alpha-antitrypsin level in setting of transaminitis.    Pt stable for transfer to telemetry with plan for discharge tomorrow if she continue to remain stable  Otis BraceMarjan Rabbani, MD  PGY-II IMTS Pager [phone] 11/26/2013  8:18 AM  History and all data above reviewed.  Patient examined.  I agree with the  findings as above.  Breathing OK.  No painThe patient exam reveals COR:RRR  ,  Lungs: Few scattered crackles  ,  Abd: Positive bowel sounds, no rebound no guarding, Ext No edema   .  All available labs, radiology testing, previous records reviewed. Agree with documented assessment and plan. Takotsubo cardiomyopathy:  Discussed with the patient.  We will try to start a low dose of ACE inhibitor.  Probably home in the AM.    Fayrene FearingJames Shalyn Koral  12:24 PM  11/26/2013

## 2013-11-26 NOTE — Progress Notes (Signed)
  Echocardiogram 2D Echocardiogram has been performed.  Cathie BeamsGREGORY, Amarachukwu Lakatos 11/26/2013, 9:26 AM

## 2013-11-26 NOTE — Plan of Care (Signed)
Problem: Phase I Progression Outcomes Goal: Pain controlled with appropriate interventions Outcome: Completed/Met Date Met:  11/26/13 Goal: Initial discharge plan identified Outcome: Completed/Met Date Met:  11/26/13 Goal: Voiding-avoid urinary catheter unless indicated Outcome: Completed/Met Date Met:  11/26/13 Goal: Hemodynamically stable Outcome: Completed/Met Date Met:  11/26/13 Goal: Distal pulses equal to baseline Outcome: Completed/Met Date Met:  11/26/13 Goal: Vascular site scale level 0 - I Vascular Site Scale Level 0: No bruising/bleeding/hematoma Level I (Mild): Bruising/Ecchymosis, minimal bleeding/ooozing, palpable hematoma < 3 cm Level II (Moderate): Bleeding not affecting hemodynamic parameters, pseudoaneurysm, palpable hematoma > 3 cm Level III (Severe) Bleeding which affects hemodynamic parameters or retroperitoneal hemorrhage  Outcome: Completed/Met Date Met:  11/26/13  Problem: Phase II Progression Outcomes Goal: Tolerates diet Outcome: Completed/Met Date Met:  11/26/13

## 2013-11-27 ENCOUNTER — Encounter (HOSPITAL_COMMUNITY): Payer: Self-pay | Admitting: Physician Assistant

## 2013-11-27 DIAGNOSIS — I214 Non-ST elevation (NSTEMI) myocardial infarction: Secondary | ICD-10-CM | POA: Insufficient documentation

## 2013-11-27 LAB — COMPREHENSIVE METABOLIC PANEL
ALT: 162 U/L — ABNORMAL HIGH (ref 0–35)
AST: 62 U/L — AB (ref 0–37)
Albumin: 2.8 g/dL — ABNORMAL LOW (ref 3.5–5.2)
Alkaline Phosphatase: 99 U/L (ref 39–117)
Anion gap: 11 (ref 5–15)
BUN: 23 mg/dL (ref 6–23)
CALCIUM: 8.9 mg/dL (ref 8.4–10.5)
CO2: 25 mEq/L (ref 19–32)
Chloride: 106 mEq/L (ref 96–112)
Creatinine, Ser: 0.98 mg/dL (ref 0.50–1.10)
GFR calc non Af Amer: 59 mL/min — ABNORMAL LOW (ref >90)
GFR, EST AFRICAN AMERICAN: 69 mL/min — AB (ref >90)
GLUCOSE: 99 mg/dL (ref 70–99)
Potassium: 5 mEq/L (ref 3.7–5.3)
Sodium: 142 mEq/L (ref 137–147)
TOTAL PROTEIN: 5.7 g/dL — AB (ref 6.0–8.3)
Total Bilirubin: 0.2 mg/dL — ABNORMAL LOW (ref 0.3–1.2)

## 2013-11-27 LAB — CBC
HCT: 36.8 % (ref 36.0–46.0)
Hemoglobin: 11.9 g/dL — ABNORMAL LOW (ref 12.0–15.0)
MCH: 27.9 pg (ref 26.0–34.0)
MCHC: 32.3 g/dL (ref 30.0–36.0)
MCV: 86.4 fL (ref 78.0–100.0)
PLATELETS: 230 10*3/uL (ref 150–400)
RBC: 4.26 MIL/uL (ref 3.87–5.11)
RDW: 14.5 % (ref 11.5–15.5)
WBC: 6.8 10*3/uL (ref 4.0–10.5)

## 2013-11-27 MED ORDER — METOPROLOL SUCCINATE ER 25 MG PO TB24: 25.0000 mg | ORAL_TABLET | Freq: Every day | ORAL | Status: DC

## 2013-11-27 MED ORDER — LISINOPRIL 2.5 MG PO TABS: 2.5000 mg | ORAL_TABLET | Freq: Every day | ORAL | Status: DC

## 2013-11-27 NOTE — Progress Notes (Signed)
SUBJECTIVE:  Pt seen and examined in AM. No acute events overnight. Telemetry reveals NSR with short burst of paroxysmal atrial tachycardia.   She is s/p non-obstructive cardiac catherization with reduced EF 25% and anterior inferior/apical kinesis consistent with stress cardiomyopathy. Her last troponin level was 0.82. She denies any chest pain, dyspnea, or LE swelling.   Precipitating event migraine?  OBJECTIVE:   Vitals:   Filed Vitals:   11/26/13 1347 11/26/13 1638 11/26/13 2029 11/27/13 0517  BP: 97/67 108/81 92/65 97/67   Pulse: 76  78 76  Temp: 98.2 F (36.8 C)  97.9 F (36.6 C) 97.8 F (36.6 C)  TempSrc: Oral  Oral Oral  Resp: 18  16 16   Height:      Weight:    110 lb (49.896 kg)  SpO2: 96%  94% 97%   I&O's:  No intake or output data in the 24 hours ending 11/27/13 0749 TELEMETRY: Reviewed telemetry pt in NSR with short burst of paroxysmal atrial tachycardia.   ECHO: 11/26/13 -  - Normal LV size. EF 15% with preserved function of basal LV segments. However, the mid to apical segments were all akinetic. The RV was normal in size with mildly decreased systolic function (RV apex akinetic). Mild pulmonary hypertension. Stable repaired mitral valve. Overall, consistent with stress cardiomyopathy.- Normal LV size. EF 15% with preserved function of basal LV segments. However, the mid to apical segments were all akinetic. The RV was normal in size with mildly decreased systolic function (RV apex akinetic). Mild pulmonary hypertension. Stable repaired mitral valve. Overall, consistent with stress cardiomyopathy.    PHYSICAL EXAM General: Well developed, well nourished, in no acute distress Head:   Normal cephalic and atramatic  Lungs:   Increased breath sound b/l Heart:   HRRR S1 S2  Holosystolic murmur.   Abdomen: Abdomen soft and non-tender Msk:  Back normal,  Normal strength and tone for age. Extremities:   No edema.   Neuro: Alert and  oriented. Psych:  Normal affect, responds appropriately Skin: No rash   LABS: Basic Metabolic Panel:  Recent Labs  16/10/9609/25/15 0320 11/27/13 0250  NA 140 142  K 4.2 5.0  CL 104 106  CO2 21 25  GLUCOSE 98 99  BUN 19 23  CREATININE 0.91 0.98  CALCIUM 8.6 8.9   Liver Function Tests:  Recent Labs  11/26/13 0320 11/27/13 0250  AST 109* 62*  ALT 212* 162*  ALKPHOS 95 99  BILITOT <0.2* 0.2*  PROT 5.9* 5.7*  ALBUMIN 3.0* 2.8*    Recent Labs  11/25/13 0820  LIPASE 39   CBC:  Recent Labs  11/25/13 0745 11/26/13 0320 11/27/13 0250  WBC 8.5 7.3 6.8  NEUTROABS 6.6  --   --   HGB 13.5 11.6* 11.9*  HCT 40.4 35.8* 36.8  MCV 87.3 85.6 86.4  PLT 237 235 230   Cardiac Enzymes:  Recent Labs  11/25/13 0822 11/25/13 1816 11/25/13 2314  TROPONINI 1.23* 0.89* 0.82*   Hemoglobin A1C:  Recent Labs  11/25/13 1816  HGBA1C 5.7*   Fasting Lipid Panel:  Recent Labs  11/26/13 0320  CHOL 139  HDL 41  LDLCALC 67  TRIG 155*  CHOLHDL 3.4   Coag Panel:   Lab Results  Component Value Date   INR 1.44 02/02/2010   INR 1.48 02/01/2010   INR 1.48 01/31/2010    RADIOLOGY: Dg Chest 2 View  11/25/2013   CLINICAL DATA:  Chest pressure for 1 day  EXAM: CHEST  2 VIEW  COMPARISON:  February 07, 2010  FINDINGS: There is underlying emphysematous change. There is scarring in the right upper lobe and both base regions. There is no frank edema or consolidation. There is prominence of epicardial fat on the right anteriorly, stable. Patient is status post mitral valve replacement. Heart is upper normal in size with pulmonary vascularity within normal limits. No adenopathy. No bone lesions.  IMPRESSION: Areas of lung scarring, stable. Underlying emphysema. No edema or consolidation.   Electronically Signed   By: Bretta BangWilliam  Woodruff M.D.   On: 11/25/2013 08:39   Ct Head Wo Contrast  11/23/2013   CLINICAL DATA:  Very bad headache starting yesterday.  EXAM: CT HEAD WITHOUT CONTRAST   TECHNIQUE: Contiguous axial images were obtained from the base of the skull through the vertex without intravenous contrast.  COMPARISON:  None.  FINDINGS: No acute intracranial abnormality. Specifically, no hemorrhage, hydrocephalus, mass lesion, acute infarction, or significant intracranial injury. No acute calvarial abnormality. Visualized paranasal sinuses and mastoids clear. Orbital soft tissues unremarkable.  IMPRESSION: Negative.   Electronically Signed   By: Charlett NoseKevin  Dover M.D.   On: 11/23/2013 11:54   Principal Problem:   Stress-induced cardiomyopathy Active Problems:   History of mitral valve repair   Elevated LFTs   Hyperlipidemia   Emphysema lung   ASSESSMENT: 68 yo woman with pmh of mitral valve repair in 2012 secondary to mitral regurgitation from MVP who presented with CP, elevated cardiac enzymes, and EKG changes found to have non-obstructive cardiac catherization with reduced EF 25% and anterior inferior/apical kinesis consistent with stress cardiomyopathy, elevated LFT's.   PLAN:    Stress Cardiomyopathy  - Currently CP-free with last troponin level 0.82. Currently on aspirin 81 mg daily and lopressor 12.5 BID. Will change to Toprol XL 25mg  PO QD. Low-dose lisinopril 2.5 mg daily due to soft blood pressures. Continue phase 1 cardiac rehab. Pt to follow-up as outpatient in 1-2 weeks.  History of Mitral Valve Repair -  Pt with mitral valve repair in 2012 secondary to mitral regurgitation from MVP  Hyperlipidemia - LDL improved to 67 from 130. Pt not on statin therapy at home.  AST/ALT continue to be elevated but trending down, will avoid statin for now. Pre-Diabetes - A1c 5.7 with impaired fasting glucose. Counsel on lifestyle modification. Continue to monitor as outpatient. Chronic Transaminitis - AST/ALT 244/312 improved to 109/212 to 62/162.  Acute hepatitis panel negative. Last CT abd imaging in 2012 with no liver abnormality. Consider checking alpha-antitrypsin level in setting  of emphysema. Continue to monitor as outpatient. Emphysema - Apparent on CXR. ? Diagnosis.  No prior complaints up until now of dyspnea. Pt not current or former smoker.  DC TODAY. Follow up in 1 week in clinic.   Donato SchultzMark Skains, MD  11/27/2013  7:49 AM

## 2013-11-27 NOTE — Progress Notes (Deleted)
D/c sum signed as progress note, then deleted.

## 2013-11-27 NOTE — Discharge Summary (Signed)
Discharge Summary   Patient ID: AZRA ABRELL MRN: [id number], DOB/AGE: Nov 09, 1945 68 y.o. Admit date: 11/25/2013 D/C date:     11/27/2013  Primary Care Provider: No PCP Per Patient Primary Cardiologist: Skains  Primary Discharge Diagnoses:  1. Stress cardiomyopathy (NICM) - EF 25% by cath, 15% by echo   - elevated troponin felt due to this 2. Recent migraine event 3. H/o mitral valve repair 2012 for MVP with severe MR 4. Pre-diabetes 5. Chronic transaminitis 6. Emphysema by CXR 7. Brief PAT 8. Hypotension 9. H/o hyperlipidemia, improved without statin  Secondary Discharge Diagnoses:  1. H/o osteopenia 2. H/o interstitial cystitis 3. H/o tremor 4. H/p pancreatitis at time of MV repair  Hospital Course: Ms. Mooty is a 68 y/o F with history of mitral valve repair in 2012 for MVP/MR, hypotension, hyperlipidemia who presented to [facility] 11/25/2013 with chest pain, dyspnea, elevated troponin and abnormal EKG. Her recent story is somewhat interesting. She was seen in the ED 11/23/13 with severe migraine with nausea. CT head was negative and she had relief with morphine. She followed up with Dr. Anne Fu on 11/24/13 for her yearly follow-up and was doing well from a symptom standpoint. Interestingly her EKG showed new-onset deeply inverted T waves noted lateral leads although she was totally asymptomatic from a cardiac standpoint at that time. Given her h/o tremor and recent new onset headache, Dr. Anne Fu recommended referral to neurology. He also planned to set up nuclear stress test to further evaluate her abnormal EKG.   Before this could be completed, she presented the following day on 11/25/13 with chest pressure in her neck when taking a deep breath that started around 2:30am. She denied diaphoresis or palpitations. She walked her dog in the morning and felt unusual shortness of breath with a slight incline. Troponin I was 1.23 (peak). Anion gap also 17-19, CO2 20. CBC  normal. EKG mild T-wave inversion lateral leads, PVC, no ST segment elevation. She was admitted for possible NSTEMI and was started on aspirin, IV NTG and heparin. She underwent cardiac cath 11/25/13 showing  1. Angiographically normal coronary arteries 2. Severe left ventricular dysfunction with anterior inferior and apical akinesis with basilar sparing compatible with stress cardiomyopathy/Takotsubo cardiomyopathy. Ejection fraction 25%. Supportive care was recommended. 2D Echo 11/26/13: "Normal LV size. EF 15% with preserved function of basal LVsegments. However, the mid to apical segments were all akinetic.The RV was normal in size with mildly decreased systolic function(RV apex akinetic). Mild pulmonary hypertension. Stable repairedmitral valve. Overall, consistent with stress cardiomyopathy." There was question whether the precipitating event was her recent major migraine event. She was started on metoprolol and lisinopril, further dose titration limited by soft BP. She is feeling well today without symptoms. Telemetry showed a short burst of PAT but no significant ventricular ectopy or arrhythmias thus Dr. Anne Fu did not feel she required lifevest at this time. Will need to consider eventual re-eval of LV function as outpatient. Dr. Anne Fu has seen and examined the patient today and feels he is stable for discharge. I have left a message on our office's scheduling inbox requesting a follow-up appointment in 1 week, and our office will call the patient with this appointment. Lopressor changed to Toprol on day of discharge but she got dose of 12.5mg  Lopressor this AM - I told nurse to have patient take her first dose of Toprol later this afternoon/evening and start normal dosing time tomorrow AM.  Other issues this admission: - Chronic Transaminitis - AST/ALT  244/312 improved to 109/212 to 62/162. Acute hepatitis panel negative. Last CT abd imaging in 2012 with no liver abnormality. Consider checking  alpha-antitrypsin level in setting of emphysema. Continue to monitor as outpatient. - Emphysema - Apparent on CXR. ? Diagnosis. No prior complaints up until now of dyspnea. Pt not current or former smoker.     Discharge Vitals: Blood pressure 98/71, pulse 74, temperature 97.8 F (36.6 C), temperature source Oral, resp. rate 18, height 5' (1.524 m), weight 110 lb (49.896 kg), last menstrual period 01/02/1989, SpO2 97 %.  Labs: Lab Results  Component Value Date   WBC 6.8 11/27/2013   HGB 11.9* 11/27/2013   HCT 36.8 11/27/2013   MCV 86.4 11/27/2013   PLT 230 11/27/2013    Recent Labs Lab 11/27/13 0250  NA 142  K 5.0  CL 106  CO2 25  BUN 23  CREATININE 0.98  CALCIUM 8.9  PROT 5.7*  BILITOT 0.2*  ALKPHOS 99  ALT 162*  AST 62*  GLUCOSE 99    Recent Labs  11/25/13 0822 11/25/13 1816 11/25/13 2314  TROPONINI 1.23* 0.89* 0.82*   Lab Results  Component Value Date   CHOL 139 11/26/2013   HDL 41 11/26/2013   LDLCALC 67 11/26/2013   TRIG 155* 11/26/2013   No results found for: DDIMER  Diagnostic Studies/Procedures   Dg Chest 2 View  11/25/2013   CLINICAL DATA:  Chest pressure for 1 day  EXAM: CHEST  2 VIEW  COMPARISON:  February 07, 2010  FINDINGS: There is underlying emphysematous change. There is scarring in the right upper lobe and both base regions. There is no frank edema or consolidation. There is prominence of epicardial fat on the right anteriorly, stable. Patient is status post mitral valve replacement. Heart is upper normal in size with pulmonary vascularity within normal limits. No adenopathy. No bone lesions.  IMPRESSION: Areas of lung scarring, stable. Underlying emphysema. No edema or consolidation.   Electronically Signed   By: Bretta BangWilliam  Woodruff M.D.   On: 11/25/2013 08:39   Ct Head Wo Contrast  11/23/2013   CLINICAL DATA:  Very bad headache starting yesterday.  EXAM: CT HEAD WITHOUT CONTRAST  TECHNIQUE: Contiguous axial images were obtained from the base  of the skull through the vertex without intravenous contrast.  COMPARISON:  None.  FINDINGS: No acute intracranial abnormality. Specifically, no hemorrhage, hydrocephalus, mass lesion, acute infarction, or significant intracranial injury. No acute calvarial abnormality. Visualized paranasal sinuses and mastoids clear. Orbital soft tissues unremarkable.  IMPRESSION: Negative.   Electronically Signed   By: Charlett NoseKevin  Dover M.D.   On: 11/23/2013 11:54   Cath 11/25/13 Left Heart Catheterization with Coronary Angiography Report Marzella SchleinMargaret A [place]  68 y.o.  female 1945/12/17 Procedure Date: 11/25/2013 Referring Physician: Dr. Donato SchultzMark Skains Primary Cardiologist: Dr. Donato SchultzMark Skains  INDICATIONS: Acute coronary syndrome with elevated markers and anterior T-wave abnormality. Vague chest discomfort. Prior history of widely patent coronaries prior to mitral valve repair in 2011. Exclude coronary artery disease development due to plaque rupture versus stress cardiomyopathy.  PROCEDURE: 1. Left heart catheterization; 2. Coronary angiography; 3. Left ventriculography  CONSENT:  The risks, benefits, and details of the procedure were explained in detail to the patient. Risks including death, stroke, heart attack, kidney injury, allergy, limb ischemia, bleeding and radiation injury were discussed. The patient verbalized understanding and wanted to proceed. Informed written consent was obtained.  PROCEDURE TECHNIQUE: After Xylocaine anesthesia a 5 French sheath was placed in the right femoral artery using the  modified Seldinger technique. Coronary angiography was done using a 5 F A2MP catheter. Left ventriculography was done using the A2 MP catheter and hand injection.   Multiple attempts to cannulate the left radial artery were made prior to the right femoral sheath placement. On 2 occasions we were able to get the artery but was unable to advance the guidewire. We then developed severe spasm. Further attempts to  use the radial were terminated and the femoral approach used. We did not attempt to use the right radial approach because of her only IV overlying the access site.  She was adequately sedated during the procedure receiving 3 mg of Versed and 50 g of fentanyl.  CONTRAST: Total of 55 cc.  COMPLICATIONS: None   HEMODYNAMICS: Aortic pressure 94/56 mmHg; LV pressure 94 over 3 mmHg; LVEDP 18 mmHg  ANGIOGRAPHIC DATA: The left main coronary artery is normal.  The left anterior descending artery is transapical, large in caliber, and normal..  The left circumflex artery trifurcates into 3 obtuse marginal branches and is normal..  The right coronary artery is dominant and normal..  LEFT VENTRICULOGRAM: Left ventricular angiogram was done in the 30 RAO projection and revealed mid to distal anterior apical and mid to distal inferior akinesis. Basilar hyperkinesis is noted. Ejection fraction 25%. Findings are compatible with stress cardiomyopathy/Takotsubo CM.  IMPRESSIONS: 1. Angiographically normal coronary arteries 2. Severe left ventricular dysfunction with anterior inferior and apical akinesis with basilar sparing compatible with stress cardiomyopathy/Takotsubo cardiomyopathy. Ejection fraction 25%.  RECOMMENDATION:  Supportive care. ACE inhibitor and beta blocker therapy as tolerated by blood pressure. I have discontinued IV nitroglycerin. Further management per treatment team..  2D Echo 11/26/13 Study Conclusions - Left ventricle: The cavity size was normal. Wall thickness was normal. The estimated ejection fraction was 15%. The mid to apical segments of the LV were akinetic. The basal segments showed preserved function. Doppler parameters are consistent with restrictive physiology, indicative of decreased left ventricular diastolic compliance and/or increased left atrial pressure. - Aortic valve: There was no stenosis. - Mitral valve: Status post mitral valve  repair. There was no evidence for stenosis. There was trivial regurgitation. - Right ventricle: The cavity size was normal. Systolic function was mildly reduced. The apical RV was akinetic. - Tricuspid valve: There was mild-moderate regurgitation. Peak RV-RA gradient (S): 35 mm Hg. - Pulmonary arteries: PA peak pressure: 43 mm Hg (S). - Systemic veins: IVC measured 1.8 cm with < 50% respirophasic variation, suggesting RA pressure 8 mmHg. Impressions: - Normal LV size. EF 15% with preserved function of basal LV segments. However, the mid to apical segments were all akinetic. The RV was normal in size with mildly decreased systolic function (RV apex akinetic). Mild pulmonary hypertension. Stable repaired mitral valve. Overall, consistent with stress cardiomyopathy.   Discharge Medications   Current Discharge Medication List    START taking these medications   Details  lisinopril (PRINIVIL,ZESTRIL) 2.5 MG tablet Take 1 tablet (2.5 mg total) by mouth daily. Qty: 30 tablet, Refills: 6    metoprolol succinate (TOPROL-XL) 25 MG 24 hr tablet Take 1 tablet (25 mg total) by mouth daily. Qty: 30 tablet, Refills: 6      CONTINUE these medications which have NOT CHANGED   Details  Ascorbic Acid (VITAMIN C PO) Take 1 tablet by mouth daily.    aspirin 81 MG tablet Take 81 mg by mouth daily.    calcium carbonate 200 MG capsule Take 250 mg by mouth 2 (two) times daily with a  meal.    ciprofloxacin (CIPRO) 250 MG tablet Take 250 mg by mouth as needed (if macrobid is ineffective).     Multiple Vitamin (MULTIVITAMIN WITH MINERALS) TABS tablet Take 1 tablet by mouth daily.    nitrofurantoin, macrocrystal-monohydrate, (MACROBID) 100 MG capsule Take 100 mg by mouth every other day.       STOP taking these medications     Multiple Vitamin (MULTIVITAMIN) capsule      ondansetron (ZOFRAN) 4 MG tablet       No NTG due to hypotension.  Disposition   The patient will be  discharged in stable condition to home. Discharge Instructions    Diet - low sodium heart healthy    Complete by:  As directed      Increase activity slowly    Complete by:  As directed   No driving for 4 days. No lifting over 10 lbs for 2 weeks. No sexual activity for 2 weeks. Please follow up in the office before returning to work. Keep procedure site clean & dry. If you notice increased pain, swelling, bleeding or pus, call/return!  You may shower, but no soaking baths/hot tubs/pools for 1 week.   One of your heart tests showed weakness of the heart muscle this admission. This may make you more susceptible to weight gain from fluid retention, which can lead to symptoms that we call heart failure. Follow these special instructions:  1. Follow a low-salt diet and watch your fluid intake. In general, you should not be taking in more than 2 liters of fluid per day (no more than 8 glasses per day). Some patients are restricted to less than 1.5 liters of fluid per day (no more than 6 glasses per day). This includes sources of water in foods like soup, coffee, tea, milk, etc. 2. Weigh yourself on the same scale at same time of day and keep a log. 3. Call your doctor: (Anytime you feel any of the following symptoms)  - 3-4 pound weight gain in 1-2 days or 2 pounds overnight  - Shortness of breath, with or without a dry hacking cough  - Swelling in the hands, feet or stomach  - If you have to sleep on extra pillows at night in order to breathe  IT IS IMPORTANT TO LET YOUR DOCTOR KNOW EARLY ON IF YOU ARE HAVING SYMPTOMS SO WE CAN HELP YOU!          Follow-up Information    Follow up with Donato Schultz, MD.   Specialty:  Cardiology   Why:  Office will call you for your followup appointment. Call office if you have not heard back in 3 days.   Contact information:   [address] [place] [address] [phone]         Duration of Discharge Encounter: Greater than 30 minutes  including physician and PA time.  Signed, Ronie Spies PA-C 11/27/2013, 9:26 AM

## 2013-11-27 NOTE — Discharge Instructions (Signed)
Cardiac Cath Site Care °Refer to this sheet in the next few weeks. These instructions provide you with information on caring for yourself after your procedure. Your caregiver may also give you more specific instructions. Your treatment has been planned according to current medical practices, but problems sometimes occur. Call your caregiver if you have any problems or questions after your procedure. °HOME CARE INSTRUCTIONS °· You may shower the day after the procedure. Remove the bandage (dressing) and gently wash the site with plain soap and water. Gently pat the site dry. °· Do not apply powder or lotion to the site. °· Do not submerge the affected site in water for 3 to 5 days. °· Inspect the site at least twice daily. °· Do not flex or bend the affected extremity for 24 hours. °· No lifting over 5 pounds (2.3 kg) for 5 days after your procedure. °· Do not drive home if you are discharged the same day of the procedure. Have someone else drive you. °· You may drive 24 hours after the procedure unless otherwise instructed by your caregiver. °· Do not operate machinery or power tools for 24 hours. °· A responsible adult should be with you for the first 24 hours after you arrive home. °What to expect: °· Any bruising will usually fade within 1 to 2 weeks. °· Blood that collects in the tissue (hematoma) may be painful to the touch. It should usually decrease in size and tenderness within 1 to 2 weeks. °SEEK IMMEDIATE MEDICAL CARE IF: °· You have unusual pain at the radial site. °· You have redness, warmth, swelling, or pain at the radial site. °· You have drainage (other than a small amount of blood on the dressing). °· You have chills. °· You have a fever or persistent symptoms for more than 72 hours. °· You have a fever and your symptoms suddenly get worse. °· Your arm becomes pale, cool, tingly, or numb. °· You have heavy bleeding from the site. Hold pressure on the site. °Document Released: 01/21/2010 Document  Revised: 03/13/2011 Document Reviewed: 01/21/2010 °Exit[employer]® Patient Information ©2015 [employer]. This information is not intended to replace advice given to you by your health care provider. Make sure you discuss any questions you have with your health care provider. ° °

## 2013-11-27 NOTE — Progress Notes (Signed)
Charge RN reviewed discharge instructions with patient. Questions answered. Patient aware that prescriptions called into patient preferred pharmacy. AVS given to patient. Patient also aware that she will need to take one tablet of metoprolol tonight at 5pm [facility] then start taking one tablet of metoprolol every morning starting tomorrow. IV and telemetry discontinued. Tech to take patient via wheelchair to entrance where friend will be driving her home.

## 2013-11-27 NOTE — Plan of Care (Signed)
Problem: Phase I Progression Outcomes Goal: Post Cath/PCI return to appropriate Path Outcome: Completed/Met Date Met:  11/27/13 Goal: Other Phase I Outcomes/Goals Outcome: Completed/Met Date Met:  11/27/13  Problem: Phase II Progression Outcomes Goal: Barriers To Progression Addressed/Resolved Outcome: Completed/Met Date Met:  11/27/13 Goal: Discharge plan in place and appropriate Outcome: Completed/Met Date Met:  11/27/13 Goal: Pain controlled with appropriate interventions Outcome: Not Applicable Date Met:  83/38/25 Patient denies pain Goal: Hemodynamically stable Outcome: Completed/Met Date Met:  11/27/13 Goal: Ambulates up to 600 ft. in hall x 1 Outcome: Completed/Met Date Met:  05/39/76 Goal: Complications resolved/controlled Outcome: Completed/Met Date Met:  11/27/13 Goal: Activity appropriate for discharge plan Outcome: Completed/Met Date Met:  11/27/13 Goal: Vascular site scale level 0 - I Vascular Site Scale Level 0: No bruising/bleeding/hematoma Level I (Mild): Bruising/Ecchymosis, minimal bleeding/ooozing, palpable hematoma < 3 cm Level II (Moderate): Bleeding not affecting hemodynamic parameters, pseudoaneurysm, palpable hematoma > 3 cm Level III (Severe) Bleeding which affects hemodynamic parameters or retroperitoneal hemorrhage  Outcome: Completed/Met Date Met:  11/27/13 Goal: Other Discharge Outcomes/Goals Outcome: Completed/Met Date Met:  11/27/13  Problem: Consults Goal: Chest Pain Patient Education (See Patient Education module for education specifics.)  Outcome: Completed/Met Date Met:  11/27/13 Goal: Skin Care Protocol Initiated - if Braden Score 18 or less If consults are not indicated, leave blank or document N/A  Outcome: Not Applicable Date Met:  73/41/93 Goal: Tobacco Cessation referral if indicated Outcome: Not Applicable Date Met:  79/02/40 Goal: Nutrition Consult-if indicated Outcome: Not Applicable Date Met:  97/35/32 Goal: Diabetes Guidelines  if Diabetic/Glucose > 140 If diabetic or lab glucose is > 140 mg/dl - Initiate Diabetes/Hyperglycemia Guidelines & Document Interventions  Outcome: Not Applicable Date Met:  99/24/26  Problem: Phase I Progression Outcomes Goal: Hemodynamically stable Outcome: Completed/Met Date Met:  11/27/13 Goal: Anginal pain relieved Outcome: Completed/Met Date Met:  11/27/13 Goal: Aspirin unless contraindicated Outcome: Completed/Met Date Met:  11/27/13 Goal: MD aware of Cardiac Marker results Outcome: Completed/Met Date Met:  11/27/13 Goal: Voiding-avoid urinary catheter unless indicated Outcome: Completed/Met Date Met:  11/27/13 Goal: Other Phase I Outcomes/Goals Outcome: Completed/Met Date Met:  11/27/13  Problem: Phase II Progression Outcomes Goal: Hemodynamically stable Outcome: Completed/Met Date Met:  11/27/13 Goal: Anginal pain relieved Outcome: Completed/Met Date Met:  11/27/13 Goal: Stress Test if indicated Outcome: Not Applicable Date Met:  83/41/96 Goal: Cath/PCI Day Path if indicated Outcome: Completed/Met Date Met:  11/27/13 Goal: CV Risk Factors identified Outcome: Completed/Met Date Met:  11/27/13 Goal: Cardiac Rehab if ordered Outcome: Completed/Met Date Met:  11/27/13 Goal: If positive for MI, change to MI Path Outcome: Not Applicable Date Met:  22/29/79 Goal: Other Phase II Outcomes/Goals Outcome: Completed/Met Date Met:  11/27/13  Problem: Phase III Progression Outcomes Goal: Discharge plan remains appropriate-arrangements made Outcome: Completed/Met Date Met:  11/27/13 Goal: If positive for MI, change to MI Path Outcome: Not Applicable Date Met:  89/21/19 Goal: Other Phase III Outcomes/Goals Outcome: Completed/Met Date Met:  11/27/13  Problem: Discharge Progression Outcomes Goal: No anginal pain Outcome: Completed/Met Date Met:  11/27/13 Goal: Hemodynamically stable Outcome: Completed/Met Date Met:  41/74/08 Goal: Complications resolved/controlled Outcome:  Completed/Met Date Met:  11/27/13 Goal: Barriers To Progression Addressed/Resolved Outcome: Completed/Met Date Met:  11/27/13 Goal: Discharge plan in place and appropriate Outcome: Completed/Met Date Met:  11/27/13 Goal: Vascular site scale level 0 - I Vascular Site Scale Level 0: No bruising/bleeding/hematoma Level I (Mild): Bruising/Ecchymosis, minimal bleeding/ooozing, palpable hematoma < 3 cm Level II (Moderate): Bleeding not  affecting hemodynamic parameters, pseudoaneurysm, palpable hematoma > 3 cm Level III (Severe) Bleeding which affects hemodynamic parameters or retroperitoneal hemorrhage  Outcome: Completed/Met Date Met:  11/27/13 Goal: Tolerates diet Outcome: Completed/Met Date Met:  11/27/13 Goal: Activity appropriate for discharge plan Outcome: Completed/Met Date Met:  11/27/13 Goal: Other Discharge Outcomes/Goals Outcome: Completed/Met Date Met:  11/27/13

## 2013-12-05 ENCOUNTER — Other Ambulatory Visit: Payer: Self-pay | Admitting: *Deleted

## 2013-12-05 ENCOUNTER — Ambulatory Visit (INDEPENDENT_AMBULATORY_CARE_PROVIDER_SITE_OTHER): Payer: Medicare Other | Admitting: Cardiology

## 2013-12-05 ENCOUNTER — Encounter: Payer: Self-pay | Admitting: Cardiology

## 2013-12-05 VITALS — BP 122/70 | HR 81 | Ht 60.0 in | Wt 103.8 lb

## 2013-12-05 DIAGNOSIS — I5021 Acute systolic (congestive) heart failure: Secondary | ICD-10-CM

## 2013-12-05 DIAGNOSIS — I214 Non-ST elevation (NSTEMI) myocardial infarction: Secondary | ICD-10-CM

## 2013-12-05 NOTE — Progress Notes (Signed)
12/05/2013 Helen SchleinMargaret A Welborn   02/09/1945  [id number]  Primary Physician No PCP Per Patient Primary Cardiologist: Dr. Anne FuSkains  HPI:  The patient is a 68 year old female, followed by Dr. Anne FuSkains, with a history of mitral valve repair in 2012 for mitral valve prolapse/mitral regurgitation, history of hypertension and hyperlipidemia, who presents to clinic today for post hospital follow-up after recent [facility].   [facility] with complaints of chest pain and dyspnea. She was found to have an elevated troponin as well as an abnormal EKG. A 2D echocardiogram was also performed which revealed severely reduced systolic function with an estimated ejection fraction of 15%, with preserved function of the basal LV segments. However, the mid to apical segments were all akinetic. She was referred for left heart catheterization and was found to have angiographically normal coronary arteries. EF by cath was estimated at 25%. Based on her normal heart catheterization and findings of 2-D echocardiogram, it was felt that her presentation was most consistent with stress cardiomyopathy. There was question whether the precipitating event was a severe migraine headache that the patient had suffered several days prior to presentation. The patient denied any other stress-related events during that time. The remainder of her hospitalization was uncomplicated. She was monitored on telemetry which showed short bursts of PAT but no significant ventricular ectopy or arrhythmias, thus it was felt that she would not require a LifeVest. She was placed on a beta blocker as well as low-dose lisinopril. Titration was limited by soft blood pressure. She was discharged home on 11/27/2013.  Today in clinic, she states that she feels remarkably well. She denies any signs or symptoms of acute heart failure exacerbation. She denies any dyspnea, orthopnea, PND, lower extremity  edema or weight gain. She has not had any recurrent chest discomfort. No palpitations, dizziness, lightheadedness, syncope/near-syncope. She has been fully compliant with her medications at home. Up until today, her systolic pressures have been running in the low 100s. However prior to her appointment, she checked her blood pressure at home and systolic blood pressure was 120. Her blood pressure today in clinic is 122/70. Her EKG continues to show diffuse T-wave abnormalities, unchanged compared to prior tracing. Ventricular rate is 81 bpm.   Current Outpatient Prescriptions  Medication Sig Dispense Refill  . Ascorbic Acid (VITAMIN C PO) Take 1 tablet by mouth daily.    Marland Kitchen. aspirin 81 MG tablet Take 81 mg by mouth daily.    . calcium carbonate 200 MG capsule Take 250 mg by mouth 2 (two) times daily with a meal.    . lisinopril (PRINIVIL,ZESTRIL) 2.5 MG tablet Take 1 tablet (2.5 mg total) by mouth daily. 30 tablet 6  . metoprolol succinate (TOPROL-XL) 25 MG 24 hr tablet Take 1 tablet (25 mg total) by mouth daily. 30 tablet 6  . Multiple Vitamin (MULTIVITAMIN WITH MINERALS) TABS tablet Take 1 tablet by mouth daily.    . nitrofurantoin, macrocrystal-monohydrate, (MACROBID) 100 MG capsule Take 100 mg by mouth every other day.      No current facility-administered medications for this visit.    Allergies  Allergen Reactions  . Codeine     NAUSEA    History   Social History  . Marital Status: Single    Spouse Name: N/A    Number of Children: N/A  . Years of Education: N/A   Occupational History  . Not on file.   Social History Main Topics  .  Smoking status: Never Smoker   . Smokeless tobacco: Never Used  . Alcohol Use: No  . Drug Use: No  . Sexual Activity: No     Comment: TAH/BSO   Other Topics Concern  . Not on file   Social History Narrative     Review of Systems: General: negative for chills, fever, night sweats or weight changes.  Cardiovascular: negative for chest pain,  dyspnea on exertion, edema, orthopnea, palpitations, paroxysmal nocturnal dyspnea or shortness of breath Dermatological: negative for rash Respiratory: negative for cough or wheezing Urologic: negative for hematuria Abdominal: negative for nausea, vomiting, diarrhea, bright red blood per rectum, melena, or hematemesis Neurologic: negative for visual changes, syncope, or dizziness All other systems reviewed and are otherwise negative except as noted above.    Blood pressure 122/70, pulse 81, height 5' (1.524 m), weight 103 lb 12.8 oz (47.083 kg), last menstrual period 01/02/1989.  General appearance: alert, cooperative and no distress Neck: no carotid bruit and no JVD Lungs: clear to auscultation bilaterally Heart: regular rate and rhythm, S1, S2 normal, no murmur, click, rub or gallop Extremities: no LEE Pulses: 2+ and symmetric Skin: warm and dry Neurologic: Grossly normal  EKG T-wave abnormalities in the inferior and anterolateral leads. Unchanged compared to prior tracing. Heart rate 81 bpm.  ASSESSMENT AND PLAN:   1. Stressed-induced cardiomyopathy: EF of 15% on recent echocardiogram and 25% on recent left heart catheterization which also revealed angiographically normal coronary arteries. She has been on ACE and beta blocker therapy. She denies any signs or symptoms of acute heart failure exacerbation. No dyspnea, fluid retention, weight gain or edema. She is euvolemic on physical exam. She denies any palpitations, lightheadedness, dizziness, syncope/near-syncope that would be concerning for occurrence of ventricular arrhythmias. We will plan to repeat a 2-D echocardiogram in 2 weeks to reassess systolic function. During her hospitalization, upper titration of her heart further medications were limited by hypotension. Her blood pressure in clinic today is 122/70. She is currently on 25 mg of Toprol XL and 2.5 mg of lisinopril. I've instructed her to increase her lisinopril to 5 mg daily.  She will continue to monitor her blood pressure closely at home. If she is unable to tolerate this higher dose of lisinopril due to hypotension and/or symptoms, she has been instructed to reduce her dose back down to 2.5. We will plan to have her follow-up with Dr. Anne FuSkains after 2-D echocardiogram is performed.    SIMMONS, BRITTAINYPA-C 12/05/2013 9:55 AM

## 2013-12-05 NOTE — Patient Instructions (Addendum)
Your physician has recommended you make the following change in your medication:    INCREASE LISINOPRIL TO 5 MG DAILY  IF YOU HAVE ANY HYPOTENSION GO BACK TO TAKING 2.5 MG DAILY   MONITOR BLOOD PRESSURE    Your physician recommends that you schedule a follow-up appointment in:  WITH DR SKAINS IN 4 TO 6 WEEKS

## 2013-12-10 ENCOUNTER — Ambulatory Visit (HOSPITAL_COMMUNITY): Payer: Medicare Other | Attending: Cardiovascular Disease | Admitting: Cardiology

## 2013-12-10 DIAGNOSIS — I5021 Acute systolic (congestive) heart failure: Secondary | ICD-10-CM | POA: Insufficient documentation

## 2013-12-10 DIAGNOSIS — I5181 Takotsubo syndrome: Secondary | ICD-10-CM | POA: Diagnosis not present

## 2013-12-10 NOTE — Progress Notes (Signed)
Echo performed. 

## 2013-12-11 ENCOUNTER — Encounter (HOSPITAL_COMMUNITY): Payer: Self-pay | Admitting: Interventional Cardiology

## 2014-01-06 ENCOUNTER — Other Ambulatory Visit: Payer: Self-pay | Admitting: Obstetrics & Gynecology

## 2014-01-06 DIAGNOSIS — Z1231 Encounter for screening mammogram for malignant neoplasm of breast: Secondary | ICD-10-CM

## 2014-02-02 ENCOUNTER — Encounter: Payer: Self-pay | Admitting: Cardiology

## 2014-02-02 ENCOUNTER — Ambulatory Visit (INDEPENDENT_AMBULATORY_CARE_PROVIDER_SITE_OTHER): Payer: Medicare Other | Admitting: Cardiology

## 2014-02-02 VITALS — BP 110/76 | HR 86 | Ht 60.0 in | Wt 105.0 lb

## 2014-02-02 DIAGNOSIS — R251 Tremor, unspecified: Secondary | ICD-10-CM

## 2014-02-02 DIAGNOSIS — Z9889 Other specified postprocedural states: Secondary | ICD-10-CM

## 2014-02-02 DIAGNOSIS — I5181 Takotsubo syndrome: Secondary | ICD-10-CM

## 2014-02-02 NOTE — Progress Notes (Signed)
[address] [place], [place]  [address] Phone: [phone] Fax:  [phone]  Date:  02/02/2014   ID:  Helen Swanson, DOB 1945-09-28, MRN [id number]  PCP:  Pcp Not In System   History of Present Illness: Helen Swanson is a 69 y.o. female post mitral valve repair due to severe mitral regurgitation/prolapse here for followup. Hospitalization in November 2015 with chest pain, dyspnea, elevated troponin, severely reduced systolic function 15% with preserved function of basal left ventricle are segments, heart catheterization normal coronaries, EF 25%, possible stress-induced cardiomyopathy. There was question whether or not the precipitating event may have been severe migraine headache. Short burst of paroxysmal atrial tachycardia noted. No life vest. Beta blocker, low-dose lisinopril. Soft blood pressure.  Repeat echocardiogram on 12/10/13 showed normal ejection fraction. Resolution. She is feeling much better. There was several days delay before she was notified of results.  Several cruises. Recent [place] cruise  Dr. Leanora Ivanoff OB GYN noted tremor last April.   Wt Readings from Last 3 Encounters:  02/02/14 105 lb (47.628 kg)  12/05/13 103 lb 12.8 oz (47.083 kg)  11/27/13 110 lb (49.896 kg)     Past Medical History  Diagnosis Date  . Heart murmur   . Mitral regurgitation     a. s/p MV repair 2012.  Marland Kitchen Hypotension   . Osteopenia   . Tachycardia   . MVP (mitral valve prolapse)     a. s/p MV repair 2012.  . IC (interstitial cystitis)   . Takotsubo cardiomyopathy     a. 11/2013: Stress cardiomyopathy (NICM) - EF 25% by cath, 15% by echo, elevated troponin felt due to this. Normal coronaries by cath 11/25/13.  . Migraine     a. New onset 11/2013.  . Pre-diabetes   . Transaminitis   . Emphysema of lung     a. By CXR, nonsmoker.  Marland Kitchen PAT (paroxysmal atrial tachycardia)     a. Brief during 11/2013 hospitalization.  . Tremor   . Pancreatitis    a. At time of MV repair.  Marland Kitchen Hyperlipidemia     Past Surgical History  Procedure Laterality Date  . Mitral valve repair  1/12    Dr. Cornelius Moras  . Dilation and curettage of uterus      x2  . Total abdominal hysterectomy w/ bilateral salpingoophorectomy    . Toe surgery    . Shoulder surgery  5/04    right  . Wrist surgery      left 10/06, right 7/07  . Elbow surgery      right elbow for tendonitis  . Left heart catheterization with coronary angiogram N/A 11/25/2013    Procedure: LEFT HEART CATHETERIZATION WITH CORONARY ANGIOGRAM;  Surgeon: Lesleigh Noe, MD;  Location: [facility] CATH LAB;  Service: Cardiovascular;  Laterality: N/A;    Current Outpatient Prescriptions  Medication Sig Dispense Refill  . Ascorbic Acid (VITAMIN C PO) Take 1 tablet by mouth daily.    Marland Kitchen aspirin 81 MG tablet Take 81 mg by mouth daily.    . calcium carbonate 200 MG capsule Take 250 mg by mouth 2 (two) times daily with a meal.    . lisinopril (PRINIVIL,ZESTRIL) 2.5 MG tablet Take 1 tablet (2.5 mg total) by mouth daily. 30 tablet 6  . metoprolol succinate (TOPROL-XL) 25 MG 24 hr tablet Take 1 tablet (25 mg total) by mouth daily. 30 tablet 6  . Multiple Vitamin (MULTIVITAMIN WITH MINERALS) TABS tablet  Take 1 tablet by mouth daily.    . nitrofurantoin, macrocrystal-monohydrate, (MACROBID) 100 MG capsule Take 100 mg by mouth every other day.      No current facility-administered medications for this visit.    Allergies:    Allergies  Allergen Reactions  . Codeine     NAUSEA    Social History:  The patient  reports that she has never smoked. She has never used smokeless tobacco. She reports that she does not drink alcohol or use illicit drugs.   ROS:  Please see the history of present illness. Positive cough  Denies any syncope, bleeding, orthopnea, PND    PHYSICAL EXAM: VS:  BP 110/76 mmHg  Pulse 86  Ht 5' (1.524 m)  Wt 105 lb (47.628 kg)  BMI 20.51 kg/m2  LMP 01/02/1989 Well nourished, well developed,  in no acute distress HEENT: normal Neck: no JVD Cardiac:  normal S1, S2; RRR; no murmur Lungs:  clear to auscultation bilaterally, no wheezing, rhonchi or rales Abd: soft, nontender, no hepatomegaly Ext: no edema Skin: warm and dry Neuro: no focal abnormalities, mild tremor noted.   EKG:  11/24/13-sinus rhythm, 92, occasional PVC, deeply inverted T waves V3 through V6. T-wave inversion also noted inferior leads. Prior did not show T-wave inversions. Normal rhythm, 95, left axis deviation, no change from prior EKG    ASSESSMENT AND PLAN:  1. November 2015-stress induced cardiomyopathy-EF from 15% now resolved to normal. Deeply inverted T waves noted on ECG, elevated troponin as well as cardiac catheterization reassuring with normal coronary arteries. Question if mild cough is caused by ACE inhibitor, however improvement is noted. She did have a cold when she got out of the hospital.  2. Status post mitral valve repair-doing well. Antibiotic prophylaxis discussed. No other changes made. 3. Abnormal EKG-new-onset deeply inverted T waves noted lateral leads. This likely was signal of stress induced cardiomyopathy.  Sometimes during strokes, which she did not have based upon her head CT and nonfocal findings, people can have EKG changes such as T-wave inversions. She is feeling much better today. She is not currently taking any QT prolonging agents such as Cipro. She is seeing neurologist soon. 4. New-onset migraine headache-her mother had migraines. This is her first one ever. Excedrin Migraine okay. With her tremor noted as well as new migraine, I will refer her to neurology to discuss these issues. 5. Tremor-I did not notice at last visit. Currently, head shaking tremor. Not having any trouble with writing. She does vaguely remember a few years ago one of her roommates during a [place] trip mentioning that at the end of the day she noted a slight tremor. She does not really notice this machine is  staring at the mirror. I will have neurology evaluate. 6. Four-month follow-up.  Signed, Donato SchultzMark Sulema Braid, MD [facility]  02/02/2014 9:04 AM

## 2014-02-02 NOTE — Patient Instructions (Signed)
Your physician recommends that you continue on your current medications as directed. Please refer to the Current Medication list given to you today.  Your physician wants you to follow-up in:  4 months with Dr. Skains. You will receive a reminder letter in the mail two months in advance. If you don't receive a letter, please call our office to schedule the follow-up appointment.  

## 2014-02-03 ENCOUNTER — Encounter: Payer: Self-pay | Admitting: Neurology

## 2014-02-03 ENCOUNTER — Ambulatory Visit (INDEPENDENT_AMBULATORY_CARE_PROVIDER_SITE_OTHER): Payer: Medicare Other | Admitting: Neurology

## 2014-02-03 VITALS — BP 104/68 | HR 88 | Temp 98.2°F | Resp 18 | Ht 60.0 in | Wt 100.6 lb

## 2014-02-03 DIAGNOSIS — G250 Essential tremor: Secondary | ICD-10-CM

## 2014-02-03 DIAGNOSIS — R292 Abnormal reflex: Secondary | ICD-10-CM

## 2014-02-03 DIAGNOSIS — G43019 Migraine without aura, intractable, without status migrainosus: Secondary | ICD-10-CM

## 2014-02-03 LAB — T4: T4, Total: 6.6 ug/dL (ref 4.5–12.0)

## 2014-02-03 NOTE — Progress Notes (Addendum)
NEUROLOGY CONSULTATION NOTE  Helen Swanson MRN: [id number] DOB: 12/06/1945  Referring provider: Dr. Anne Fu (cardiologist) Primary care provider: none  Reason for consult:  Headache, tremor  HISTORY OF PRESENT ILLNESS: Helen Swanson is a 69 year old right-handed woman with heart murmur, hypotension, osteopenia, MVP, interstitial cystitis who presents for migraine and tremor.  Records, labs and CT reviewed.  Onset:  She presented to the ED on 11/23/13 for severe headache.  It started the day before but it persisted.  It was located bi-frontally.  It was a pounding quality.  Intensity was 10/10.  There was no aura or prodrome.  Associated symptoms included loss of appetite but no nausea.  It has only occurred that one time.   She took acetaminophen which did not help.  She is unable to take NSAIDs .  CT of the head was personally reviewed and was unremarkable.  CBC and BMP were unremarkable except for glucose of 143.  She was given morphine and a headache cocktail of Toradol , Benadryl  and Reglan .  It helped.  She denies prior history of migraine but her mother had migraines.  She never had history of headache.  Her mother had migraines.  For the future, she was told she can take Excedrin Migraine  Over the past year, it was noted by her physicians that she had a head tremor.  She never noticed it herself.  She denies tremor in the hands but sometimes if she holds a light-weight object in her left hand, she feels a slight tremor.  TSH checked last April was normal.  She denies family history of tremor.  She denies gait instability or bowel or bladder dysfunction.  PAST MEDICAL HISTORY: Past Medical History  Diagnosis Date  . Heart murmur   . Mitral regurgitation     a. s/p MV repair 2012.  Marland Kitchen Hypotension   . Osteopenia   . Tachycardia   . MVP (mitral valve prolapse)     a. s/p MV repair 2012.  . IC (interstitial cystitis)   . Takotsubo cardiomyopathy     a. 11/2013:  Stress cardiomyopathy (NICM) - EF 25% by cath, 15% by echo, elevated troponin felt due to this. Normal coronaries by cath 11/25/13.  . Migraine     a. New onset 11/2013.  . Pre-diabetes   . Transaminitis   . Emphysema of lung     a. By CXR, nonsmoker.  Marland Kitchen PAT (paroxysmal atrial tachycardia)     a. Brief during 11/2013 hospitalization.  . Tremor   . Pancreatitis     a. At time of MV repair.  Marland Kitchen Hyperlipidemia     PAST SURGICAL HISTORY: Past Surgical History  Procedure Laterality Date  . Mitral valve repair  1/12    Dr. Cornelius Moras  . Dilation and curettage of uterus      x2  . Total abdominal hysterectomy w/ bilateral salpingoophorectomy    . Toe surgery    . Shoulder surgery  5/04    right  . Wrist surgery      left 10/06, right 7/07  . Elbow surgery      right elbow for tendonitis  . Left heart catheterization with coronary angiogram N/A 11/25/2013    Procedure: LEFT HEART CATHETERIZATION WITH CORONARY ANGIOGRAM;  Surgeon: Lesleigh Noe, MD;  Location: [facility] CATH LAB;  Service: Cardiovascular;  Laterality: N/A;    MEDICATIONS: Current Outpatient Prescriptions on File Prior to Visit  Medication Sig Dispense Refill  .  Ascorbic Acid (VITAMIN C PO) Take 1 tablet by mouth daily.    Marland Kitchen aspirin 81 MG tablet Take 81 mg by mouth daily.    . calcium carbonate 200 MG capsule Take 250 mg by mouth 2 (two) times daily with a meal.    . lisinopril (PRINIVIL,ZESTRIL) 2.5 MG tablet Take 1 tablet (2.5 mg total) by mouth daily. 30 tablet 6  . metoprolol succinate (TOPROL-XL) 25 MG 24 hr tablet Take 1 tablet (25 mg total) by mouth daily. 30 tablet 6  . Multiple Vitamin (MULTIVITAMIN WITH MINERALS) TABS tablet Take 1 tablet by mouth daily.    . nitrofurantoin, macrocrystal-monohydrate, (MACROBID) 100 MG capsule Take 100 mg by mouth every other day.      No current facility-administered medications on file prior to visit.    ALLERGIES: Allergies  Allergen Reactions  . Codeine     NAUSEA     FAMILY HISTORY: Family History  Problem Relation Age of Onset  . Diabetes Mother   . CVA Father     mild MI  . Hiatal hernia Father   . Migraines Mother     SOCIAL HISTORY: History   Social History  . Marital Status: Single    Spouse Name: N/A    Number of Children: N/A  . Years of Education: N/A   Occupational History  . Not on file.   Social History Main Topics  . Smoking status: Never Smoker   . Smokeless tobacco: Never Used  . Alcohol Use: No  . Drug Use: No  . Sexual Activity: No     Comment: TAH/BSO   Other Topics Concern  . Not on file   Social History Narrative    REVIEW OF SYSTEMS: Constitutional: No fevers, chills, or sweats, no generalized fatigue, change in appetite Eyes: Legally blind Ear, nose and throat: No hearing loss, ear pain, nasal congestion, sore throat Cardiovascular: No chest pain, palpitations Respiratory:  No shortness of breath at rest or with exertion, wheezes GastrointestinaI: No nausea, vomiting, diarrhea, abdominal pain, fecal incontinence Genitourinary:  No dysuria, urinary retention or frequency Musculoskeletal:  No neck pain, back pain Integumentary: No rash, pruritus, skin lesions Neurological: as above Psychiatric: No depression, insomnia, anxiety Endocrine: No palpitations, fatigue, diaphoresis, mood swings, change in appetite, change in weight, increased thirst Hematologic/Lymphatic:  No anemia, purpura, petechiae. Allergic/Immunologic: no itchy/runny eyes, nasal congestion, recent allergic reactions, rashes  PHYSICAL EXAM: Filed Vitals:   02/03/14 0819  BP: 104/68  Pulse: 88  Temp: 98.2 F (36.8 C)  Resp: 18   General: No acute distress Head:  Normocephalic/atraumatic.  Exhibits tremor. Eyes:  fundi unremarkable, without vessel changes, exudates, hemorrhages or papilledema. Neck: supple, no paraspinal tenderness, full range of motion Back: No paraspinal tenderness Heart: regular rate and rhythm Lungs:  Clear to auscultation bilaterally. Vascular: No carotid bruits. Neurological Exam: Mental status: alert and oriented to person, place, and time, recent and remote memory intact, fund of knowledge intact, attention and concentration intact, speech fluent and not dysarthric, language intact. Cranial nerves: CN I: not tested CN II: pupils equal, round and reactive to light, visual fields intact although she is legally blind, fundi unremarkable, without vessel changes, exudates, hemorrhages or papilledema. CN III, IV, VI:  full range of motion, no nystagmus, no ptosis CN V: facial sensation intact CN VII: upper and lower face symmetric CN VIII: hearing intact CN IX, X: gag intact, uvula midline CN XI: sternocleidomastoid and trapezius muscles intact CN XII: tongue midline Bulk & Tone: increased  tone in right lower extremity Motor:  5/5 throughout Sensation:  Temperature and vibration intact Deep Tendon Reflexes:  2+ in upper extremity and 3+ in lower extremities with nonsustained clonus in ankles.  Toes downgoing but she exhibited Hoffman's sign in right hand. Finger to nose testing:  No tremor or dysmetria Heel to shin:  No dysmetria Gait:  Normal station and stride.  Able to turn.  Some mild difficulty walking in tandem. Romberg negative.  IMPRESSION: Migraine, isolated event.  Resolved.  No recurrence. Probable benign head tremor Hyperreflexia  PLAN: 1.  Will check MRI of brain with and without contrast for new onset headache 2.  Will check thyroid panel for tremor 3.  Will check MRI cervical spine for hyperreflexia and increased tone 4.  Follow up in 6 months.  Will contact her with results and any further management based on results.  Thank you for allowing me to take part in the care of this patient.  Shon MilletAdam Jaffe, DO  CC:  Donato SchultzMark Skains, MD

## 2014-02-03 NOTE — Patient Instructions (Addendum)
To evaluate for the headache, we will get MRI of the brain with and without contrast [facility] 02/16/14 at 7:45 am  To evaluate the brisk reflexes, we will get MRI of cervical spine without contrast  [facility] 02/16/14 at 7:45 am  We will check Thyroid panel Follow up in 6 months but will call with results

## 2014-02-04 ENCOUNTER — Telehealth: Payer: Self-pay | Admitting: *Deleted

## 2014-02-04 LAB — T3: T3 TOTAL: 105.4 ng/dL (ref 80.0–204.0)

## 2014-02-04 LAB — TSH: TSH: 2.716 u[IU]/mL (ref 0.350–4.500)

## 2014-02-04 NOTE — Telephone Encounter (Signed)
-----   Message from Cira ServantAdam Robert Jaffe, DO sent at 02/04/2014  6:25 AM EST ----- Thyroid labs are normal. ----- Message -----    From: Lab in [facility] Interface    Sent: 02/04/2014   1:20 AM      To: Cira ServantAdam Robert Jaffe, DO

## 2014-02-04 NOTE — Telephone Encounter (Signed)
patient is aware that Thyroid labs are normal.

## 2014-02-06 ENCOUNTER — Ambulatory Visit (HOSPITAL_COMMUNITY)
Admission: RE | Admit: 2014-02-06 | Discharge: 2014-02-06 | Disposition: A | Discharge status: Home / Self Care | Payer: Medicare Other | Source: Ambulatory Visit | Attending: Obstetrics & Gynecology | Admitting: Obstetrics & Gynecology

## 2014-02-06 DIAGNOSIS — Z1231 Encounter for screening mammogram for malignant neoplasm of breast: Secondary | ICD-10-CM | POA: Insufficient documentation

## 2014-02-16 ENCOUNTER — Ambulatory Visit (HOSPITAL_COMMUNITY)
Admission: RE | Admit: 2014-02-16 | Discharge: 2014-02-16 | Disposition: A | Discharge status: Home / Self Care | Payer: Medicare Other | Source: Ambulatory Visit | Attending: Neurology | Admitting: Neurology

## 2014-02-16 DIAGNOSIS — R292 Abnormal reflex: Secondary | ICD-10-CM | POA: Diagnosis present

## 2014-02-16 DIAGNOSIS — M47892 Other spondylosis, cervical region: Secondary | ICD-10-CM | POA: Diagnosis not present

## 2014-02-16 DIAGNOSIS — M1990 Unspecified osteoarthritis, unspecified site: Secondary | ICD-10-CM | POA: Diagnosis not present

## 2014-02-16 DIAGNOSIS — G939 Disorder of brain, unspecified: Secondary | ICD-10-CM | POA: Diagnosis not present

## 2014-02-16 DIAGNOSIS — G43019 Migraine without aura, intractable, without status migrainosus: Secondary | ICD-10-CM

## 2014-02-16 DIAGNOSIS — G319 Degenerative disease of nervous system, unspecified: Secondary | ICD-10-CM | POA: Diagnosis not present

## 2014-02-16 DIAGNOSIS — G250 Essential tremor: Secondary | ICD-10-CM

## 2014-02-16 LAB — CREATININE, SERUM
CREATININE: 0.93 mg/dL (ref 0.50–1.10)
GFR, EST AFRICAN AMERICAN: 73 mL/min — AB (ref >90)
GFR, EST NON AFRICAN AMERICAN: 63 mL/min — AB (ref >90)

## 2014-02-16 MED ORDER — GADOBENATE DIMEGLUMINE 529 MG/ML IV SOLN
10.0000 mL | Freq: Once | INTRAVENOUS | Status: AC
  Administered 2014-02-16: 10 mL via INTRAVENOUS

## 2014-04-09 ENCOUNTER — Telehealth: Payer: Self-pay | Admitting: Obstetrics & Gynecology

## 2014-04-09 ENCOUNTER — Ambulatory Visit: Payer: Self-pay | Admitting: Obstetrics & Gynecology

## 2014-04-09 NOTE — Telephone Encounter (Signed)
Appointment for today was cancelled due to insurance reasons. Staff message sent to billing.

## 2014-04-17 ENCOUNTER — Encounter: Payer: Self-pay | Admitting: Obstetrics & Gynecology

## 2014-04-17 ENCOUNTER — Ambulatory Visit (INDEPENDENT_AMBULATORY_CARE_PROVIDER_SITE_OTHER): Payer: Medicare Other | Admitting: Obstetrics & Gynecology

## 2014-04-17 VITALS — BP 128/86 | HR 88 | Resp 18 | Ht 60.0 in | Wt 100.0 lb

## 2014-04-17 DIAGNOSIS — R748 Abnormal levels of other serum enzymes: Secondary | ICD-10-CM | POA: Diagnosis not present

## 2014-04-17 DIAGNOSIS — Z01419 Encounter for gynecological examination (general) (routine) without abnormal findings: Secondary | ICD-10-CM | POA: Diagnosis not present

## 2014-04-17 NOTE — Progress Notes (Signed)
69 y.o. [name] [name] here for annual exam.  Doing well.  Frustrated with medicare today, in particular.  Went to ER in November due to really severe headache.  CT was negative.  Morphine helped headache.  Had chest pain then and went back to the ER.  Had additional evaluation including echocardiogram.  EF was 15%.  Repeat echo was good with EF 60%.    Also has seen Dr. Everlena CooperJaffe, neurology, and head and neck MRI was done.    Reveiwed all of these notes with pt.  CMP in hospital in November showed elevated liver function tests.  Hepatitis testing was negative.  Has not had follow up testing.    Patient's last menstrual period was 01/02/1989.          Sexually active: No.  The current method of family planning is Hysterectomy. Exercising: Yes.    Walking  Smoker:  no  Health Maintenance: Pap:  09/1999 WNL History of abnormal Pap:  no MMG: 02/06/14 BIRADS1:Neg Self Breast Exam: yes, once a month.  Colonoscopy: 2007 repeat in 10 years  BMD:  04/2013 TDaP:  2008 Screening Labs: 11/2013, Hb today: 11/2013, Urine today: urology.    reports that she has never smoked. She has never used smokeless tobacco. She reports that she does not drink alcohol or use illicit drugs.  Past Medical History  Diagnosis Date  . Heart murmur   . Mitral regurgitation     a. s/p MV repair 2012.  Marland Kitchen. Hypotension   . Osteopenia   . Tachycardia   . MVP (mitral valve prolapse)     a. s/p MV repair 2012.  . IC (interstitial cystitis)   . Takotsubo cardiomyopathy     a. 11/2013: Stress cardiomyopathy (NICM) - EF 25% by cath, 15% by echo, elevated troponin felt due to this. Normal coronaries by cath 11/25/13.  . Migraine     a. New onset 11/2013.  . Pre-diabetes   . Transaminitis   . Emphysema of lung     a. By CXR, nonsmoker.  Marland Kitchen. PAT (paroxysmal atrial tachycardia)     a. Brief during 11/2013 hospitalization.  . Tremor   . Pancreatitis     a. At time of MV repair.  Marland Kitchen. Hyperlipidemia     Past  Surgical History  Procedure Laterality Date  . Mitral valve repair  1/12    Dr. Cornelius Moraswen  . Dilation and curettage of uterus      x2  . Total abdominal hysterectomy w/ bilateral salpingoophorectomy    . Toe surgery    . Shoulder surgery  5/04    right  . Wrist surgery      left 10/06, right 7/07  . Elbow surgery      right elbow for tendonitis  . Left heart catheterization with coronary angiogram N/A 11/25/2013    Procedure: LEFT HEART CATHETERIZATION WITH CORONARY ANGIOGRAM;  Surgeon: Lesleigh NoeHenry W Smith III, MD;  [facility];  Service: Cardiovascular;  Laterality: N/A;    Current Outpatient Prescriptions  Medication Sig Dispense Refill  . Ascorbic Acid (VITAMIN C PO) Take 1 tablet by mouth daily.    Marland Kitchen. aspirin 81 MG tablet Take 81 mg by mouth daily.    . calcium carbonate 200 MG capsule Take 250 mg by mouth 2 (two) times daily with a meal.    . lisinopril (PRINIVIL,ZESTRIL) 2.5 MG tablet Take 1 tablet (2.5 mg total) by mouth daily. 30 tablet 6  . metoprolol succinate (TOPROL-XL) 25 MG 24  hr tablet Take 1 tablet (25 mg total) by mouth daily. 30 tablet 6  . Multiple Vitamin (MULTIVITAMIN WITH MINERALS) TABS tablet Take 1 tablet by mouth daily.    . nitrofurantoin, macrocrystal-monohydrate, (MACROBID) 100 MG capsule Take 100 mg by mouth every other day.      No current facility-administered medications for this visit.    Family History  Problem Relation Age of Onset  . Diabetes Mother   . CVA Father     mild MI  . Hiatal hernia Father   . Migraines Mother     ROS:  Pertinent items are noted in HPI.  Otherwise, a comprehensive ROS was negative.  Exam:   BP 128/86 mmHg  Pulse 88  Resp 18  Ht 5' (1.524 m)  Wt 100 lb (45.36 kg)  BMI 19.53 kg/m2  LMP 01/02/1989   Height: 5' (152.4 cm)  Ht Readings from Last 3 Encounters:  04/17/14 5' (1.524 m)  02/03/14 5' (1.524 m)  02/02/14 5' (1.524 m)    General appearance: alert, cooperative and appears stated age Head:  Normocephalic, without obvious abnormality, atraumatic Neck: no adenopathy, supple, symmetrical, trachea midline and thyroid normal to inspection and palpation Lungs: clear to auscultation bilaterally Breasts: normal appearance, no masses or tenderness Heart: regular rate and rhythm Abdomen: soft, non-tender; bowel sounds normal; no masses,  no organomegaly Extremities: extremities normal, atraumatic, no cyanosis or edema Skin: Skin color, texture, turgor normal. No rashes or lesions Lymph nodes: Cervical, supraclavicular, and axillary nodes normal. No abnormal inguinal nodes palpated Neurologic: Grossly normal   Pelvic: External genitalia:  no lesions              Urethra:  normal appearing urethra with no masses, tenderness or lesions              Bartholins and Skenes: normal                 Vagina: normal appearing vagina with normal color and discharge  Cervix:  absent              Pap taken: No. Bimanual Exam:  Uterus: absent             Adnexa: normal adnexa and no mass, fullness, tenderness               Rectovaginal: Confirms               Anus:  normal sphincter tone, no lesions  Chaperone was present for exam.  A:  Well Woman with normal exam H/O IC and recurrent UTI-type symptoms with negative cultures Seeing Dr. Sherron Monday yearly, now. H/O TAH H/O mitral valve repair 1/12 H/O stress inducted cardiomyopathy Vaginal atrophy Osteopenia H/O stress inducted cardiomyopathy with decreased EF 11/15, normal with 12/15 repeat testing Elevated liver enzymes when hospitalized in November.  Needs repeat testing  P: Mammogram yearly pap smear not indicated CMP return annually or prn

## 2014-04-18 LAB — COMPREHENSIVE METABOLIC PANEL
ALT: 17 U/L (ref 0–35)
AST: 28 U/L (ref 0–37)
Albumin: 4.6 g/dL (ref 3.5–5.2)
Alkaline Phosphatase: 85 U/L (ref 39–117)
BUN: 21 mg/dL (ref 6–23)
CHLORIDE: 100 meq/L (ref 96–112)
CO2: 24 meq/L (ref 19–32)
CREATININE: 0.82 mg/dL (ref 0.50–1.10)
Calcium: 10.1 mg/dL (ref 8.4–10.5)
Glucose, Bld: 86 mg/dL (ref 70–99)
Potassium: 5.3 mEq/L (ref 3.5–5.3)
Sodium: 139 mEq/L (ref 135–145)
Total Bilirubin: 0.4 mg/dL (ref 0.2–1.2)
Total Protein: 7.4 g/dL (ref 6.0–8.3)

## 2014-06-02 ENCOUNTER — Ambulatory Visit (INDEPENDENT_AMBULATORY_CARE_PROVIDER_SITE_OTHER): Payer: Medicare Other | Admitting: Cardiology

## 2014-06-02 ENCOUNTER — Encounter: Payer: Self-pay | Admitting: Cardiology

## 2014-06-02 VITALS — BP 102/70 | HR 86 | Ht 60.0 in | Wt 101.8 lb

## 2014-06-02 DIAGNOSIS — I340 Nonrheumatic mitral (valve) insufficiency: Secondary | ICD-10-CM

## 2014-06-02 DIAGNOSIS — I5181 Takotsubo syndrome: Secondary | ICD-10-CM | POA: Diagnosis not present

## 2014-06-02 DIAGNOSIS — Z9889 Other specified postprocedural states: Secondary | ICD-10-CM

## 2014-06-02 MED ORDER — METOPROLOL SUCCINATE ER 25 MG PO TB24: 25.0000 mg | ORAL_TABLET | Freq: Every day | ORAL | Status: DC

## 2014-06-02 NOTE — Patient Instructions (Addendum)
Medication Instructions:  Please stop your Lisinopril. The current medical regimen is effective;  continue present plan and medications.  Follow-Up: Follow up in 6 months with Dr. Anne FuSkains.  You will receive a letter in the mail 2 months before you are due.  Please call us when you receive this letter to schedule your follow up appointment.  Thank you for choosing [facility]!!

## 2014-06-02 NOTE — Progress Notes (Signed)
[address] [place], [place]  [address] Phone: [phone] Fax:  [phone]  Date:  06/02/2014   ID:  Helen SchleinMargaret A Moynahan, DOB 1945-01-28, MRN [id number]  PCP:  Pcp Not In System   History of Present Illness: Helen Swanson is a 69 y.o. female post mitral valve repair due to severe mitral regurgitation/prolapse here for followup. Hospitalization in November 2015 with chest pain, dyspnea, elevated troponin, severely reduced systolic function 15% with preserved function of basal left ventricle are segments, heart catheterization normal coronaries, EF 25%, possible stress-induced cardiomyopathy. There was question whether or not the precipitating event may have been severe migraine headache. Short burst of paroxysmal atrial tachycardia noted. No life vest. Beta blocker, low-dose lisinopril. Soft blood pressure.  Repeat echocardiogram on 12/10/13 showed normal ejection fraction. Resolution. She is feeling much better. There was several days delay before she was notified of results.  Several cruises. [place]  Dr. Leanora IvanoffSusanne Miller OB GYN noted tremor last April.   06/02/14-overall doing very well. No complaints, no chest pain, no shortness of breath, no orthopnea. She is continuing to maintain her normal ejection fraction. No symptoms.  Wt Readings from Last 3 Encounters:  06/02/14 101 lb 12.8 oz (46.176 kg)  04/17/14 100 lb (45.36 kg)  02/03/14 100 lb 9.6 oz (45.632 kg)     Past Medical History  Diagnosis Date  . Heart murmur   . Mitral regurgitation     a. s/p MV repair 2012.  Marland Kitchen. Hypotension   . Osteopenia   . Tachycardia   . MVP (mitral valve prolapse)     a. s/p MV repair 2012.  . IC (interstitial cystitis)   . Takotsubo cardiomyopathy     a. 11/2013: Stress cardiomyopathy (NICM) - EF 25% by cath, 15% by echo, elevated troponin felt due to this. Normal coronaries by cath 11/25/13.  . Migraine     a. New onset 11/2013.  . Pre-diabetes   .  Transaminitis   . Emphysema of lung     a. By CXR, nonsmoker.  Marland Kitchen. PAT (paroxysmal atrial tachycardia)     a. Brief during 11/2013 hospitalization.  . Tremor   . Pancreatitis     a. At time of MV repair.  Marland Kitchen. Hyperlipidemia     Past Surgical History  Procedure Laterality Date  . Mitral valve repair  1/12    Dr. Cornelius Moraswen  . Dilation and curettage of uterus      x2  . Total abdominal hysterectomy w/ bilateral salpingoophorectomy    . Toe surgery    . Shoulder surgery  5/04    right  . Wrist surgery      left 10/06, right 7/07  . Elbow surgery      right elbow for tendonitis  . Left heart catheterization with coronary angiogram N/A 11/25/2013    Procedure: LEFT HEART CATHETERIZATION WITH CORONARY ANGIOGRAM;  Surgeon: Lesleigh NoeHenry W Smith III, MD;  [facility];  Service: Cardiovascular;  Laterality: N/A;    Current Outpatient Prescriptions  Medication Sig Dispense Refill  . Ascorbic Acid (VITAMIN C PO) Take 1 tablet by mouth daily.    Marland Kitchen. aspirin 81 MG tablet Take 81 mg by mouth daily.    . calcium carbonate 200 MG capsule Take 250 mg by mouth 2 (two) times daily with a meal.    . lisinopril (PRINIVIL,ZESTRIL) 2.5 MG tablet Take 1 tablet (2.5 mg total) by mouth daily. 30 tablet 6  .  metoprolol succinate (TOPROL-XL) 25 MG 24 hr tablet Take 1 tablet (25 mg total) by mouth daily. 30 tablet 6  . Multiple Vitamin (MULTIVITAMIN WITH MINERALS) TABS tablet Take 1 tablet by mouth daily.    . nitrofurantoin, macrocrystal-monohydrate, (MACROBID) 100 MG capsule Take 100 mg by mouth every other day.      No current facility-administered medications for this visit.    Allergies:    Allergies  Allergen Reactions  . Codeine     NAUSEA    Social History:  The patient  reports that she has never smoked. She has never used smokeless tobacco. She reports that she does not drink alcohol or use illicit drugs.   ROS:  Please see the history of present illness. Positive cough  Denies any syncope,  bleeding, orthopnea, PND    PHYSICAL EXAM: VS:  BP 102/70 mmHg  Pulse 86  Ht 5' (1.524 m)  Wt 101 lb 12.8 oz (46.176 kg)  BMI 19.88 kg/m2  LMP 01/02/1989 Well nourished, well developed, in no acute distress HEENT: normal Neck: no JVD Cardiac:  normal S1, S2; RRR; no murmur Lungs:  clear to auscultation bilaterally, no wheezing, rhonchi or rales Abd: soft, nontender, no hepatomegaly Ext: no edema Skin: warm and dry Neuro: no focal abnormalities, mild tremor noted.   EKG:  11/24/13-sinus rhythm, 92, occasional PVC, deeply inverted T waves V3 through V6. T-wave inversion also noted inferior leads. Prior did not show T-wave inversions. Normal rhythm, 95, left axis deviation, no change from prior EKG    ASSESSMENT AND PLAN:  1. November 2015-stress induced cardiomyopathy-EF from 15% now resolved to normal 60% on 12/10/13 echocardiogram. Deeply inverted T waves noted on ECG, elevated troponin as well as cardiac catheterization reassuring with normal coronary arteries. I will go ahead and stop her lisinopril 2.5 mg, continue with Toprol 25. Contemplate cessation if she is still doing well.  2. Status post mitral valve repair-doing well. Antibiotic prophylaxis discussed. No other changes made. 3. Abnormal EKG-previously deeply inverted T waves noted lateral leads. This likely was signal of stress induced cardiomyopathy, Takotsubo.   She is seeing neurologist soon. 4. New-onset migraine headache-her mother had migraines. This is her first one ever. Excedrin Migraine okay. Neurology has seen, MRI unremarkable. Reassurance. 5. Tremor- Currently, head shaking tremor. Not having any trouble with writing. She does vaguely remember a few years ago one of her roommates during a [employer] trip mentioning that at the end of the day she noted a slight tremor (no longer volunteering). 6. Six-month follow-up.  Signed, Donato Schultz, MD [name]  06/02/2014 9:06 AM

## 2014-08-04 ENCOUNTER — Ambulatory Visit: Payer: Medicare Other | Admitting: Neurology

## 2014-08-20 ENCOUNTER — Encounter: Payer: Self-pay | Admitting: Neurology

## 2014-08-20 ENCOUNTER — Ambulatory Visit (INDEPENDENT_AMBULATORY_CARE_PROVIDER_SITE_OTHER): Payer: Medicare Other | Admitting: Neurology

## 2014-08-20 VITALS — BP 104/62 | HR 70 | Resp 16 | Ht 60.0 in | Wt 103.9 lb

## 2014-08-20 DIAGNOSIS — I679 Cerebrovascular disease, unspecified: Secondary | ICD-10-CM

## 2014-08-20 DIAGNOSIS — G43019 Migraine without aura, intractable, without status migrainosus: Secondary | ICD-10-CM | POA: Diagnosis not present

## 2014-08-20 DIAGNOSIS — G250 Essential tremor: Secondary | ICD-10-CM | POA: Insufficient documentation

## 2014-08-20 NOTE — Patient Instructions (Signed)
Follow up as needed. Continue aspirin  daily as MRI of brain did show evidence of small old stroke

## 2014-08-20 NOTE — Progress Notes (Signed)
NEUROLOGY FOLLOW UP OFFICE NOTE  Helen Swanson [id number]  HISTORY OF PRESENT ILLNESS: Helen Swanson is a 69 year old right-handed woman with stress-induced cardiomyopathy, heart murmur, hypotension, osteopenia, MVP, interstitial cystitis who follows up for isolated migraine and benign head tremor.  UPDATE: Given the isolated severe headache, she had an MRI of the brain with and without contrast from 02/17/14 showed small vessel disease and mild cerebellar atrophy.  She exhibited hyperreflexia, so MRI of the cervical spine was performed and showed no evidence of myelopathy.  Since last visit, she has been feeling well.  She has not had a recurrent headache.  The head tremor is not worse and does not bother her.  UPDATE: Onset:  She presented to the ED on 11/23/13 for severe headache.  It started the day before but it persisted.  It was located bi-frontally.  It was a pounding quality.  Intensity was 10/10.  There was no aura or prodrome.  Associated symptoms included loss of appetite but no nausea.  It has only occurred that one time.   She took acetaminophen which did not help.  She is unable to take NSAIDs .  CT of the head was personally reviewed and was unremarkable.  CBC and BMP were unremarkable except for glucose of 143.  She was given morphine and a headache cocktail of Toradol , Benadryl  and Reglan .  It helped.  She denies prior history of migraine but her mother had migraines.  She never had history of headache.  Her mother had migraines.  For the future, she was told she can take Excedrin Migraine  Over the past year, it was noted by her physicians that she had a head tremor.  She never noticed it herself.  She denies tremor in the hands but sometimes if she holds a light-weight object in her left hand, she feels a slight tremor.  TSH checked last April was normal.  She denies family history of tremor.  She denies gait instability or bowel or bladder  dysfunction.  PAST MEDICAL HISTORY: Past Medical History  Diagnosis Date  . Heart murmur   . Mitral regurgitation     a. s/p MV repair 2012.  Marland Kitchen Hypotension   . Osteopenia   . Tachycardia   . MVP (mitral valve prolapse)     a. s/p MV repair 2012.  . IC (interstitial cystitis)   . Takotsubo cardiomyopathy     a. 11/2013: Stress cardiomyopathy (NICM) - EF 25% by cath, 15% by echo, elevated troponin felt due to this. Normal coronaries by cath 11/25/13.  . Migraine     a. New onset 11/2013.  . Pre-diabetes   . Transaminitis   . Emphysema of lung     a. By CXR, nonsmoker.  Marland Kitchen PAT (paroxysmal atrial tachycardia)     a. Brief during 11/2013 hospitalization.  . Tremor   . Pancreatitis     a. At time of MV repair.  Marland Kitchen Hyperlipidemia     MEDICATIONS: Current Outpatient Prescriptions on File Prior to Visit  Medication Sig Dispense Refill  . Ascorbic Acid (VITAMIN C PO) Take 1 tablet by mouth daily.    Marland Kitchen aspirin 81 MG tablet Take 81 mg by mouth daily.    . calcium carbonate 200 MG capsule Take 250 mg by mouth 2 (two) times daily with a meal.    . metoprolol succinate (TOPROL-XL) 25 MG 24 hr tablet Take 1 tablet (25 mg total) by mouth daily. 90 tablet 3  .  Multiple Vitamin (MULTIVITAMIN WITH MINERALS) TABS tablet Take 1 tablet by mouth daily.    . nitrofurantoin, macrocrystal-monohydrate, (MACROBID) 100 MG capsule Take 100 mg by mouth every other day.      No current facility-administered medications on file prior to visit.    ALLERGIES: Allergies  Allergen Reactions  . Codeine     NAUSEA    FAMILY HISTORY: Family History  Problem Relation Age of Onset  . Diabetes Mother   . CVA Father     mild MI  . Hiatal hernia Father   . Migraines Mother     SOCIAL HISTORY: Social History   Social History  . Marital Status: Single    Spouse Name: N/A  . Number of Children: N/A  . Years of Education: N/A   Occupational History  . Not on file.   Social History Main Topics  .  Smoking status: Never Smoker   . Smokeless tobacco: Never Used  . Alcohol Use: No  . Drug Use: No  . Sexual Activity: No     Comment: TAH/BSO   Other Topics Concern  . Not on file   Social History Narrative    REVIEW OF SYSTEMS: Constitutional: No fevers, chills, or sweats, no generalized fatigue, change in appetite Eyes: No visual changes, double vision, eye pain Ear, nose and throat: No hearing loss, ear pain, nasal congestion, sore throat Cardiovascular: No chest pain, palpitations Respiratory:  No shortness of breath at rest or with exertion, wheezes GastrointestinaI: No nausea, vomiting, diarrhea, abdominal pain, fecal incontinence Genitourinary:  No dysuria, urinary retention or frequency Musculoskeletal:  No neck pain, back pain Integumentary: No rash, pruritus, skin lesions Neurological: as above Psychiatric: No depression, insomnia, anxiety Endocrine: No palpitations, fatigue, diaphoresis, mood swings, change in appetite, change in weight, increased thirst Hematologic/Lymphatic:  No anemia, purpura, petechiae. Allergic/Immunologic: no itchy/runny eyes, nasal congestion, recent allergic reactions, rashes  PHYSICAL EXAM: Filed Vitals:   08/20/14 0922  BP: 104/62  Pulse: 70  Resp: 16   General: No acute distress.  Patient appears well-groomed.  Head:  Normocephalic/atraumatic Eyes:  Fundoscopic exam unremarkable without vessel changes, exudates, hemorrhages or papilledema. Neck: supple, no paraspinal tenderness, full range of motion Heart:  Regular rate and rhythm Lungs:  Clear to auscultation bilaterally Back: No paraspinal tenderness Neurological Exam: alert and oriented to person, place, and time. Attention span and concentration intact, recent and remote memory intact, fund of knowledge intact.  Speech fluent and not dysarthric, language intact.  CN II-XII intact. Fundoscopic exam unremarkable without vessel changes, exudates, hemorrhages or papilledema.  Bulk  normal, muscle strength 5/5 throughout.  Sensation to light touch intact.  Deep tendon reflexes 2+ in upper extremities and 3+ in lower extremities, toes downgoing.  Head tremor noted.  Finger to nose and heel to shin testing intact.  Gait mildly wide-based but steady, Romberg negative.  IMPRESSION: Isolated migraine.  MRI showed nothing emergent or concerning Benign head tremor.  It does not bother her so no medication will be prescribed. Cerebrovascular disease  PLAN: 1.  She is already on ASA, which serves as secondary stroke prevention.  Lipid panel from November 2015 looked okay with LDL of 67 (LDL goal should be less than 100). 2.  Follow up as needed.  15 minutes spent face to face with patient, over 50% spent discussing MRI results, diagnoses and management.  Shon Millet, DO  CC:  Donato Schultz, MD

## 2014-11-30 ENCOUNTER — Ambulatory Visit (INDEPENDENT_AMBULATORY_CARE_PROVIDER_SITE_OTHER): Payer: Medicare Other | Admitting: Cardiology

## 2014-11-30 ENCOUNTER — Encounter: Payer: Self-pay | Admitting: Cardiology

## 2014-11-30 VITALS — BP 110/74 | HR 90 | Ht 60.0 in | Wt 105.4 lb

## 2014-11-30 DIAGNOSIS — I5181 Takotsubo syndrome: Secondary | ICD-10-CM | POA: Diagnosis not present

## 2014-11-30 DIAGNOSIS — R000 Tachycardia, unspecified: Secondary | ICD-10-CM

## 2014-11-30 DIAGNOSIS — Z9889 Other specified postprocedural states: Secondary | ICD-10-CM

## 2014-11-30 NOTE — Progress Notes (Signed)
[address] [place], [place]  [address] Phone: [phone](336) [phone] Fax:  [phone]  Date:  11/30/2014   ID:  Helen SchleinMargaret A Swanson, DOB [birth year], MRN [id number]  PCP:  No PCP Per Patient   History of Present Illness: Helen Swanson is a 69 y.o. female post mitral valve repair due to severe mitral regurgitation/prolapse here for followup. Hospitalization in November 2015 with chest pain, dyspnea, elevated troponin, severely reduced systolic function 15% with preserved function of basal left ventricle are segments, heart catheterization normal coronaries, EF 25%, possible stress-induced cardiomyopathy. There was question whether or not the precipitating event may have been severe migraine headache. Short burst of paroxysmal atrial tachycardia noted.  Beta blocker. Soft blood pressure.  Repeat echocardiogram on 12/10/13 showed normal ejection fraction. Resolution. She is feeling much better.   Several cruises, [employer] (50). [place]  Dr. Leanora IvanoffSusanne Swanson OB GYN noted tremor last April.   Overall she is doing very well, no chest pain, no shortness of breath, no syncope.  Wt Readings from Last 3 Encounters:  11/30/14 105 lb 6.4 oz (47.809 kg)  08/20/14 103 lb 14.4 oz (47.129 kg)  06/02/14 101 lb 12.8 oz (46.176 kg)     Past Medical History  Diagnosis Date  . Heart murmur   . Mitral regurgitation     a. s/p MV repair 2012.  Marland Kitchen. Hypotension   . Osteopenia   . Tachycardia   . MVP (mitral valve prolapse)     a. s/p MV repair 2012.  . IC (interstitial cystitis)   . Takotsubo cardiomyopathy     a. 11/2013: Stress cardiomyopathy (NICM) - EF 25% by cath, 15% by echo, elevated troponin felt due to this. Normal coronaries by cath 11/25/13.  . Migraine     a. New onset 11/2013.  . Pre-diabetes   . Transaminitis   . Emphysema of lung (HCC)     a. By CXR, nonsmoker.  Marland Kitchen. PAT (paroxysmal atrial tachycardia) (HCC)     a. Brief during 11/2013 hospitalization.    . Tremor   . Pancreatitis     a. At time of MV repair.  Marland Kitchen. Hyperlipidemia     Past Surgical History  Procedure Laterality Date  . Mitral valve repair  1/12    Dr. Cornelius Moraswen  . Dilation and curettage of uterus      x2  . Total abdominal hysterectomy w/ bilateral salpingoophorectomy    . Toe surgery    . Shoulder surgery  5/04    right  . Wrist surgery      left 10/06, right 7/07  . Elbow surgery      right elbow for tendonitis  . Left heart catheterization with coronary angiogram N/A 11/25/2013    Procedure: LEFT HEART CATHETERIZATION WITH CORONARY ANGIOGRAM;  Surgeon: Lesleigh NoeHenry W Smith III, MD;  [facility];  Service: Cardiovascular;  Laterality: N/A;    Current Outpatient Prescriptions  Medication Sig Dispense Refill  . Ascorbic Acid (VITAMIN C PO) Take 1 tablet by mouth daily.    Marland Kitchen. aspirin 81 MG tablet Take 81 mg by mouth daily.    . calcium carbonate 200 MG capsule Take 250 mg by mouth 2 (two) times daily with a meal.    . metoprolol succinate (TOPROL-XL) 25 MG 24 hr tablet Take 1 tablet (25 mg total) by mouth daily. 90 tablet 3  . Multiple Vitamin (MULTIVITAMIN WITH MINERALS) TABS tablet Take 1 tablet by mouth daily.    .Marland Kitchen  nitrofurantoin, macrocrystal-monohydrate, (MACROBID) 100 MG capsule Take 100 mg by mouth every other day.      No current facility-administered medications for this visit.    Allergies:    Allergies  Allergen Reactions  . Codeine Nausea And Vomiting    NAUSEA    Social History:  The patient  reports that she has never smoked. She has never used smokeless tobacco. She reports that she does not drink alcohol or use illicit drugs.   ROS:  Please see the history of present illness. Positive cough  Denies any syncope, bleeding, orthopnea, PND    PHYSICAL EXAM: VS:  BP 110/74 mmHg  Pulse 90  Ht 5' (1.524 m)  Wt 105 lb 6.4 oz (47.809 kg)  BMI 20.58 kg/m2  LMP 01/02/1989 Well nourished, well developed, in no acute distress, thin HEENT:  normal Neck: no JVD Cardiac:  normal S1, S2; RRR; no murmur Lungs:  clear to auscultation bilaterally, no wheezing, rhonchi or rales Abd: soft, nontender, no hepatomegaly Ext: no edema Skin: warm and dry Neuro: no focal abnormalities, mild tremor noted.   EKG:  Today 11/30/14 sinus rhythm, 90, left axis deviation personally viewed, poor R-wave progression no longer 11/24/13-sinus rhythm, 92, occasional PVC, deeply inverted T waves V3 through V6. T-wave inversion also noted inferior leads. Prior did not show T-wave inversions. Normal rhythm, 95, left axis deviation, no change from prior EKG    ASSESSMENT AND PLAN:  1. November 2015-resolved stress induced cardiomyopathy-EF from 15% now resolved to normal 60% on 12/10/13 echocardiogram. Deeply inverted T waves noted on ECG, elevated troponin as well as cardiac catheterization reassuring with normal coronary arteries.Stopped her lisinopril 2.5 mg, continue with Toprol 25. Contemplate cessation if she is still doing well. She states that she does not mind taking low-dose beta blocker. We will continue for now. 2. Status post mitral valve repair-doing well. Antibiotic prophylaxis discussed. No other changes made. 3. Abnormal EKG-previously deeply inverted T waves noted lateral leads. This likely was signal of stress induced cardiomyopathy, Takotsubo.    4. New-onset migraine headache-her mother had migraines. She has not had another migraine since. Excedrin Migraine okay. Neurology has seen, MRI unremarkable. Reassurance. 5. Tremor- Currently, head shaking tremor. Not having any trouble with writing. She does vaguely remember a few years ago one of her roommates during a [employer] trip mentioning that at the end of the day she noted a slight tremor (no longer volunteering). 6. Six-month follow-up.  Signed, Donato Schultz, MD [facility]  11/30/2014 8:26 AM

## 2014-11-30 NOTE — Patient Instructions (Signed)

## 2014-12-01 ENCOUNTER — Other Ambulatory Visit: Payer: Self-pay | Admitting: Cardiology

## 2014-12-08 NOTE — Addendum Note (Signed)
Addended by: Reesa ChewJONES, Juanette Urizar G on: 12/08/2014 05:14 PM   Modules accepted: Orders

## 2015-02-01 ENCOUNTER — Other Ambulatory Visit: Payer: Self-pay

## 2015-02-01 DIAGNOSIS — Z1231 Encounter for screening mammogram for malignant neoplasm of breast: Secondary | ICD-10-CM

## 2015-02-26 ENCOUNTER — Ambulatory Visit
Admission: RE | Admit: 2015-02-26 | Discharge: 2015-02-26 | Disposition: A | Discharge status: Home / Self Care | Payer: Medicare Other | Source: Ambulatory Visit

## 2015-02-26 DIAGNOSIS — Z1231 Encounter for screening mammogram for malignant neoplasm of breast: Secondary | ICD-10-CM | POA: Diagnosis not present

## 2015-03-01 ENCOUNTER — Other Ambulatory Visit: Payer: Self-pay | Admitting: Family Medicine

## 2015-03-01 DIAGNOSIS — R928 Other abnormal and inconclusive findings on diagnostic imaging of breast: Secondary | ICD-10-CM

## 2015-03-04 ENCOUNTER — Other Ambulatory Visit: Payer: Self-pay

## 2015-03-04 ENCOUNTER — Other Ambulatory Visit: Payer: Self-pay | Admitting: Obstetrics & Gynecology

## 2015-03-04 DIAGNOSIS — R928 Other abnormal and inconclusive findings on diagnostic imaging of breast: Secondary | ICD-10-CM

## 2015-03-08 ENCOUNTER — Other Ambulatory Visit: Payer: Self-pay

## 2015-03-08 ENCOUNTER — Ambulatory Visit
Admission: RE | Admit: 2015-03-08 | Discharge: 2015-03-08 | Disposition: A | Discharge status: Home / Self Care | Payer: Medicare Other | Source: Ambulatory Visit | Attending: Family Medicine | Admitting: Family Medicine

## 2015-03-08 DIAGNOSIS — R928 Other abnormal and inconclusive findings on diagnostic imaging of breast: Secondary | ICD-10-CM

## 2015-04-01 IMAGING — CT CT HEAD W/O CM
1 series · 16 of 27 positions shown, 20 images · non-contrast
Comparison: None.

CLINICAL DATA: Very bad headache starting yesterday.

EXAM:
CT HEAD WITHOUT CONTRAST
TECHNIQUE: Contiguous axial images were obtained from the base of the skull
through the vertex without intravenous contrast.

[Series 2: head 5.0 h30s · axial · 0.41mm/px · z∈[+435,+555]mm · 16 of 27 slices shown, 20 images]
[im 2/27  brain]
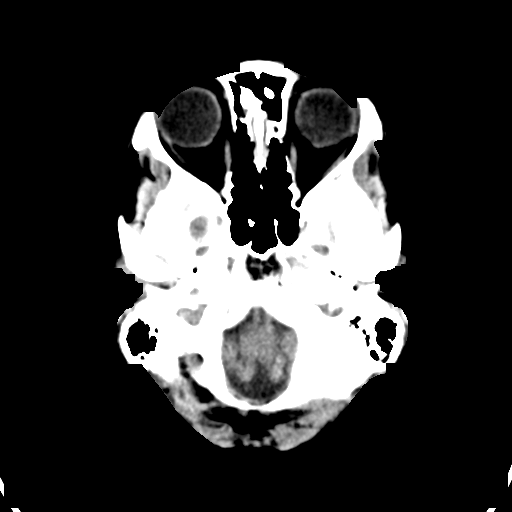
[im 2/27  bone]
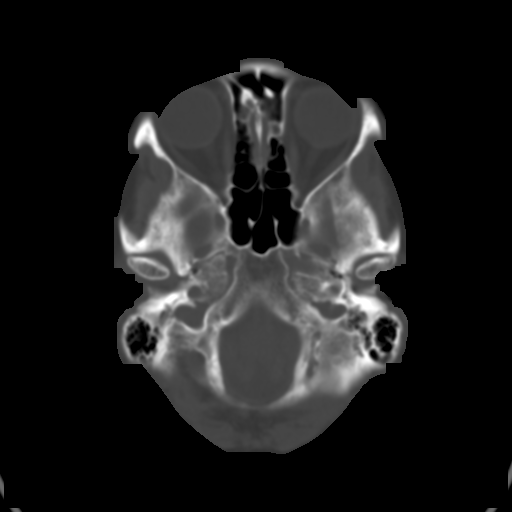
[im 4/27  brain]
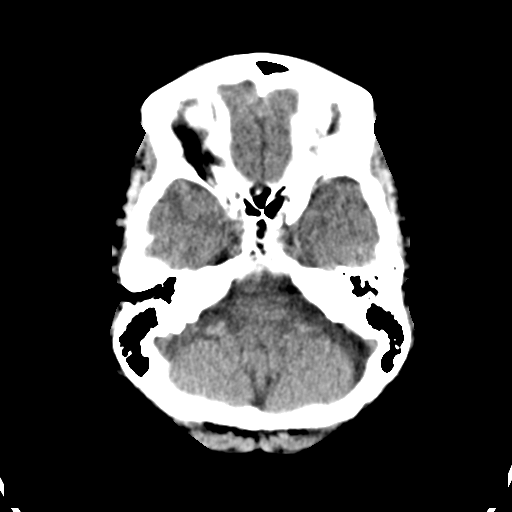
[im 5/27  brain]
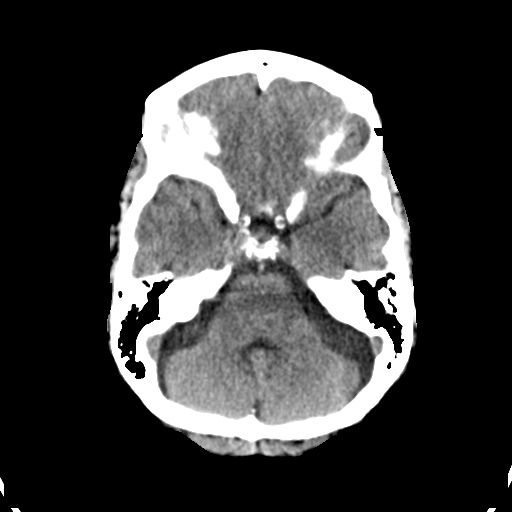
[im 7/27  brain]
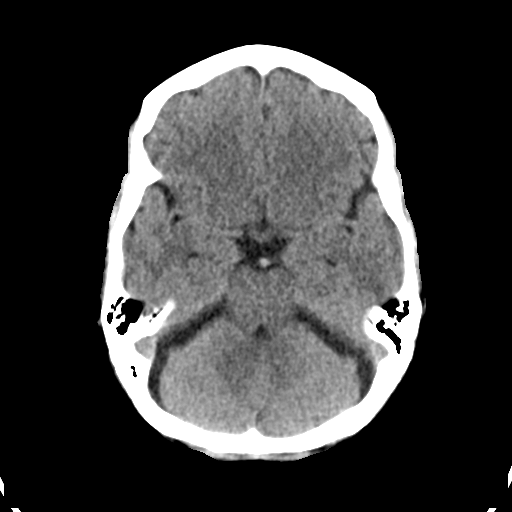
[im 9/27  brain]
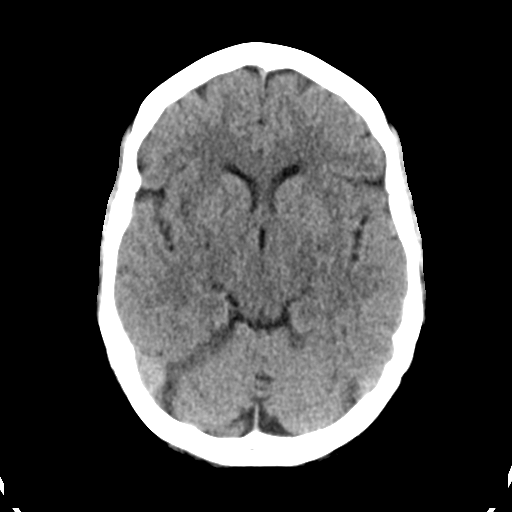
[im 9/27  bone]
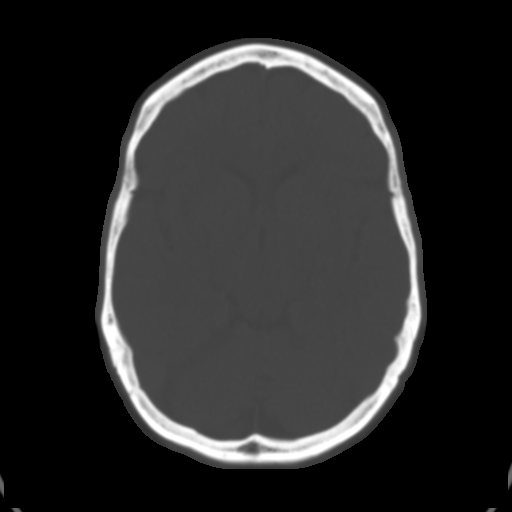
[im 10/27  brain]
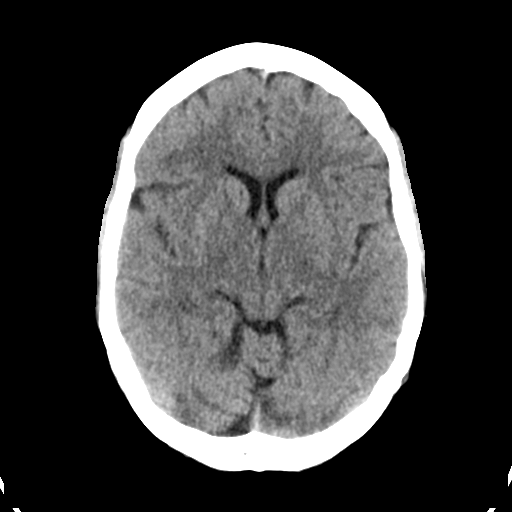
[im 12/27  brain]
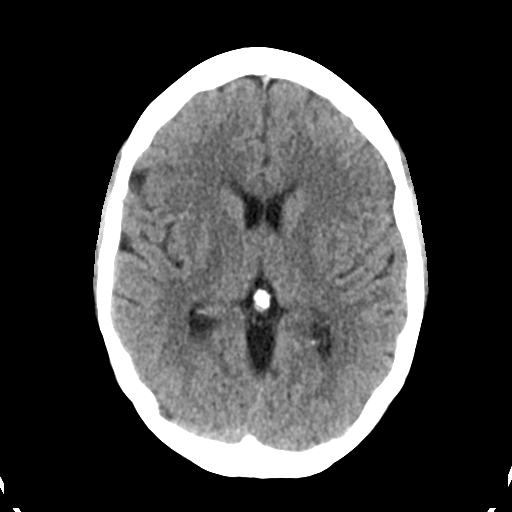
[im 13/27  brain]
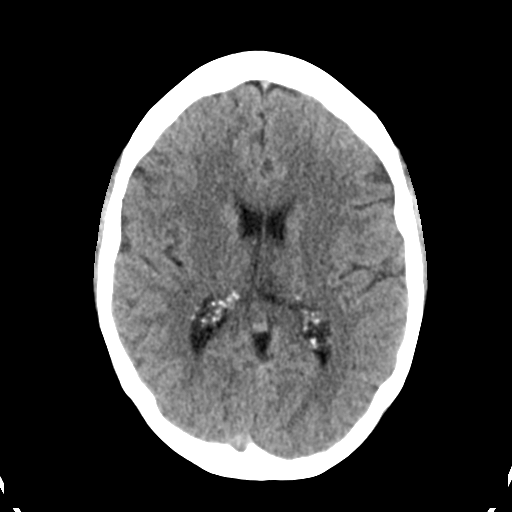
[im 15/27  brain]
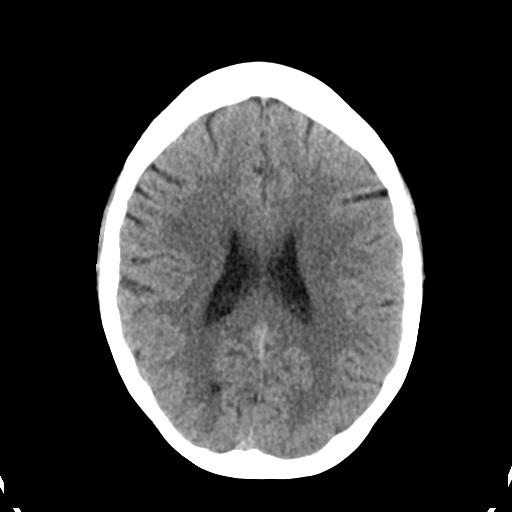
[im 15/27  bone]
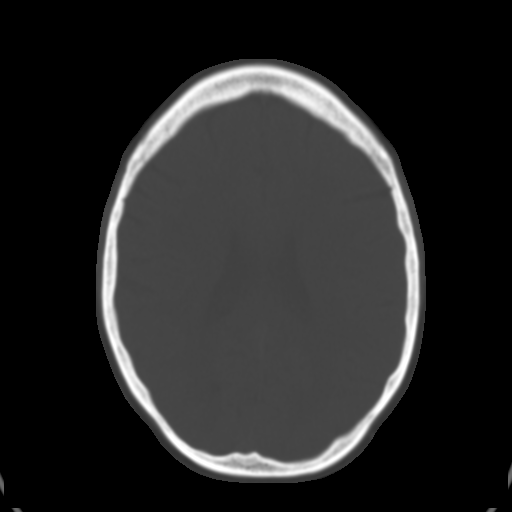
[im 16/27  brain]
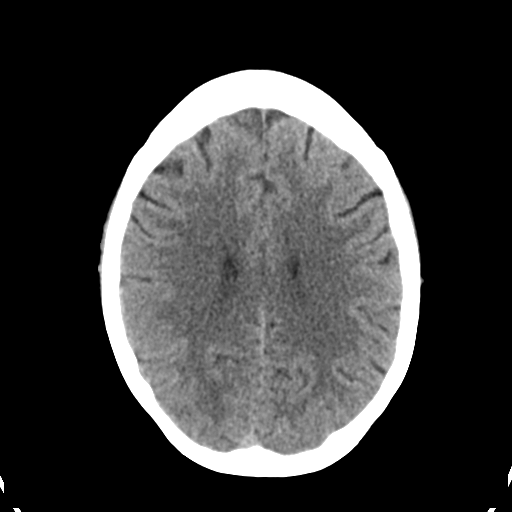
[im 18/27  brain]
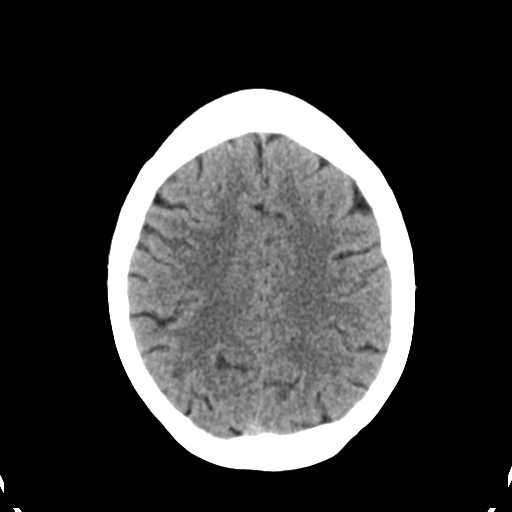
[im 19/27  brain]
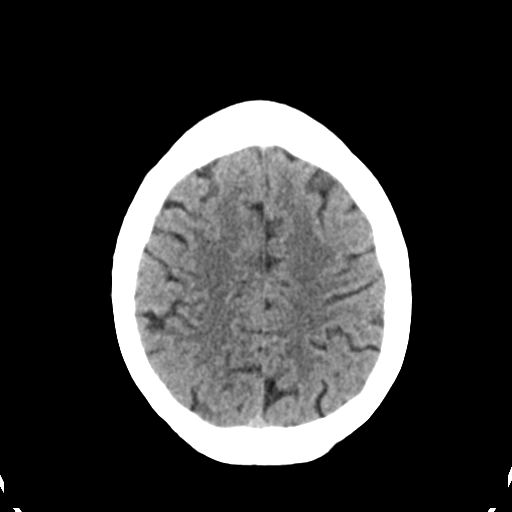
[im 21/27  brain]
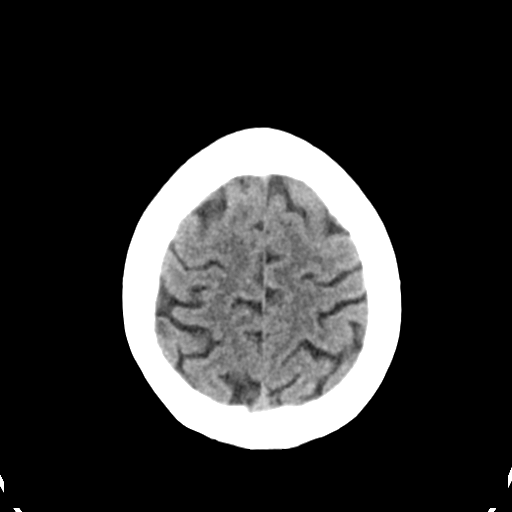
[im 21/27  bone]
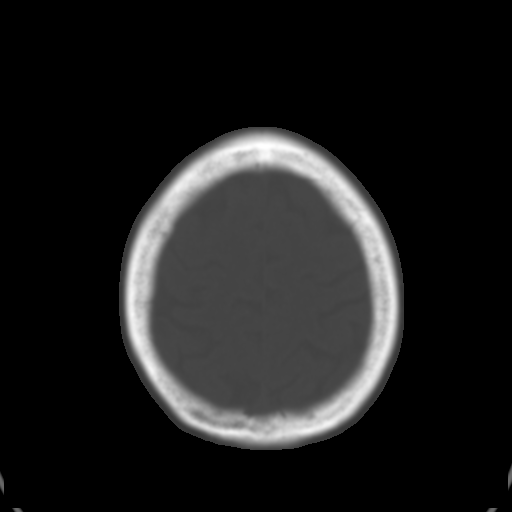
[im 23/27  brain]
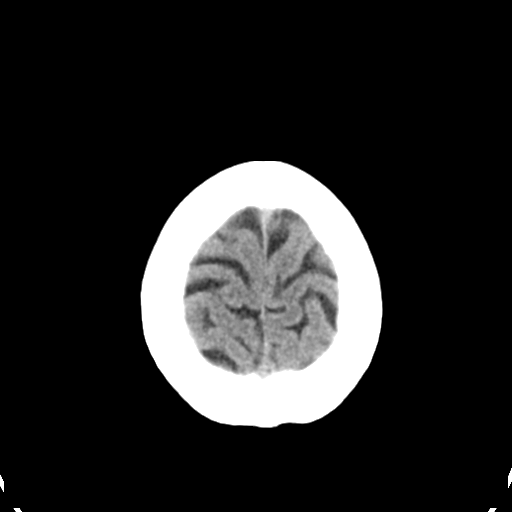
[im 24/27  brain]
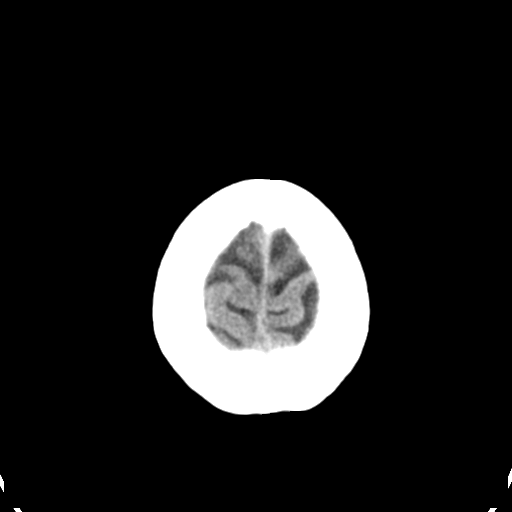
[im 26/27  brain]
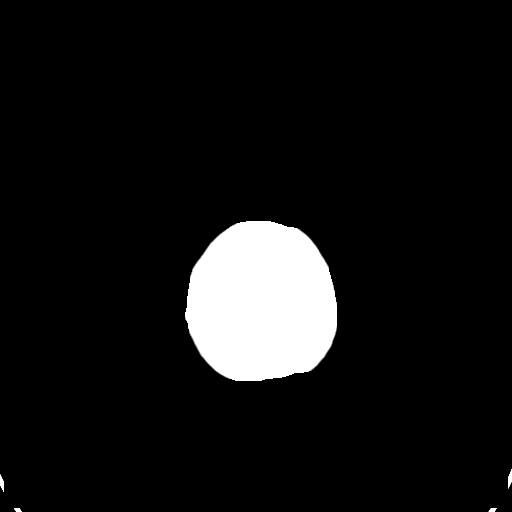

[16 of 27 positions shown; findings below may reference images not displayed]

FINDINGS: No acute intracranial abnormality. Specifically, no hemorrhage,
hydrocephalus, mass lesion, acute infarction, or significant
intracranial injury. No acute calvarial abnormality. Visualized
paranasal sinuses and mastoids clear. Orbital soft tissues
unremarkable.
IMPRESSION: Negative.

## 2015-04-15 ENCOUNTER — Ambulatory Visit: Payer: Medicare Other | Admitting: Obstetrics & Gynecology

## 2015-04-19 ENCOUNTER — Encounter: Payer: Self-pay | Admitting: Obstetrics & Gynecology

## 2015-04-19 ENCOUNTER — Ambulatory Visit (INDEPENDENT_AMBULATORY_CARE_PROVIDER_SITE_OTHER): Payer: Medicare Other | Admitting: Obstetrics & Gynecology

## 2015-04-19 VITALS — BP 132/80 | HR 88 | Resp 16 | Ht 59.75 in | Wt 104.0 lb

## 2015-04-19 DIAGNOSIS — N289 Disorder of kidney and ureter, unspecified: Secondary | ICD-10-CM

## 2015-04-19 DIAGNOSIS — D6489 Other specified anemias: Secondary | ICD-10-CM

## 2015-04-19 DIAGNOSIS — Z01419 Encounter for gynecological examination (general) (routine) without abnormal findings: Secondary | ICD-10-CM | POA: Diagnosis not present

## 2015-04-19 DIAGNOSIS — E785 Hyperlipidemia, unspecified: Secondary | ICD-10-CM

## 2015-04-19 DIAGNOSIS — Z205 Contact with and (suspected) exposure to viral hepatitis: Secondary | ICD-10-CM | POA: Diagnosis not present

## 2015-04-19 LAB — HEPATITIS C ANTIBODY: HCV AB: NEGATIVE

## 2015-04-19 LAB — LIPID PANEL
CHOL/HDL RATIO: 3.7 ratio (ref <=5.0)
Cholesterol: 239 mg/dL — ABNORMAL HIGH (ref 125–200)
HDL: 65 mg/dL (ref >=46)
LDL CALC: 140 mg/dL — AB (ref <130)
Triglycerides: 172 mg/dL — ABNORMAL HIGH (ref <150)
VLDL: 34 mg/dL — AB (ref <30)

## 2015-04-19 LAB — COMPREHENSIVE METABOLIC PANEL
ALBUMIN: 4.7 g/dL (ref 3.6–5.1)
ALT: 23 U/L (ref 6–29)
AST: 25 U/L (ref 10–35)
Alkaline Phosphatase: 95 U/L (ref 33–130)
BUN: 24 mg/dL (ref 7–25)
CHLORIDE: 103 mmol/L (ref 98–110)
CO2: 25 mmol/L (ref 20–31)
CREATININE: 1.01 mg/dL — AB (ref 0.50–0.99)
Calcium: 10.2 mg/dL (ref 8.6–10.4)
Glucose, Bld: 85 mg/dL (ref 65–99)
POTASSIUM: 5.2 mmol/L (ref 3.5–5.3)
SODIUM: 144 mmol/L (ref 135–146)
Total Bilirubin: 0.5 mg/dL (ref 0.2–1.2)
Total Protein: 7.7 g/dL (ref 6.1–8.1)

## 2015-04-19 LAB — CBC
HCT: 46 % — ABNORMAL HIGH (ref 35.0–45.0)
Hemoglobin: 15.5 g/dL (ref 11.7–15.5)
MCH: 28.9 pg (ref 27.0–33.0)
MCHC: 33.7 g/dL (ref 32.0–36.0)
MCV: 85.7 fL (ref 80.0–100.0)
MPV: 10.2 fL (ref 7.5–12.5)
PLATELETS: 294 10*3/uL (ref 140–400)
RBC: 5.37 MIL/uL — ABNORMAL HIGH (ref 3.80–5.10)
RDW: 14.1 % (ref 11.0–15.0)
WBC: 5 10*3/uL (ref 3.8–10.8)

## 2015-04-19 LAB — TSH: TSH: 3.92 mIU/L

## 2015-04-19 NOTE — Patient Instructions (Signed)
Dr. Kriste BasqueHannah Swanson, Helen Swanson, Helen Swanson, Helen Swanson  Phone: [phone]) [phone]

## 2015-04-19 NOTE — Progress Notes (Signed)
70 y.o. [name] [name] here for annual exam.  Doing well.  No vaginal bleeding.  Going to the beach next week.  Denies vaginal bleeding.    Seeing Dr. Sherron MondayMacDiarmid every six months.  Pt using Macrobid and only Cipro when necessary.  Pt only used once last year.   Patient's last menstrual period was 01/02/1989.          Sexually active: No.  The current method of family planning is post menopausal, hysterectomy status.    Exercising: Yes.    walking Smoker:  no  Health Maintenance: Pap:  09/1999 normal   History of abnormal Pap:  no MMG: 03/08/15 Diagnostic Left BIRADS1:neg Colonoscopy: 2007 repeat 10 years.  Pt aware this is due.  Pt has gotten a letter about this. BMD:  04/14/13 Osteoporosis  TDaP:  2008  Screening Labs: ?, Urine today: urology   reports that she has never smoked. She has never used smokeless tobacco. She reports that she does not drink alcohol or use illicit drugs.  Past Medical History  Diagnosis Date  . Heart murmur   . Mitral regurgitation     a. s/p MV repair 2012.  Marland Kitchen. Hypotension   . Osteopenia   . Tachycardia   . MVP (mitral valve prolapse)     a. s/p MV repair 2012.  . IC (interstitial cystitis)   . Takotsubo cardiomyopathy     a. 11/2013: Stress cardiomyopathy (NICM) - EF 25% by cath, 15% by echo, elevated troponin felt due to this. Normal coronaries by cath 11/25/13.  . Migraine     a. New onset 11/2013.  . Pre-diabetes   . Transaminitis   . Emphysema of lung (HCC)     a. By CXR, nonsmoker.  Marland Kitchen. PAT (paroxysmal atrial tachycardia) (HCC)     a. Brief during 11/2013 hospitalization.  . Tremor   . Pancreatitis     a. At time of MV repair.  Marland Kitchen. Hyperlipidemia     Past Surgical History  Procedure Laterality Date  . Mitral valve repair  1/12    Dr. Cornelius Moraswen  . Dilation and curettage of uterus      x2  . Total abdominal hysterectomy w/ bilateral salpingoophorectomy    . Toe surgery    . Shoulder surgery  5/04    right  . Wrist surgery       left 10/06, right 7/07  . Elbow surgery      right elbow for tendonitis  . Left heart catheterization with coronary angiogram N/A 11/25/2013    Procedure: LEFT HEART CATHETERIZATION WITH CORONARY ANGIOGRAM;  Surgeon: Lesleigh NoeHenry W Smith III, MD;  [facility];  Service: Cardiovascular;  Laterality: N/A;    Current Outpatient Prescriptions  Medication Sig Dispense Refill  . Ascorbic Acid (VITAMIN C PO) Take 1 tablet by mouth daily.    Marland Kitchen. aspirin 81 MG tablet Take 81 mg by mouth daily.    . calcium carbonate 200 MG capsule Take 250 mg by mouth 2 (two) times daily with a meal.    . metoprolol succinate (TOPROL-XL) 25 MG 24 hr tablet Take 1 tablet (25 mg total) by mouth daily. 90 tablet 3  . Multiple Vitamin (MULTIVITAMIN WITH MINERALS) TABS tablet Take 1 tablet by mouth daily.    . nitrofurantoin, macrocrystal-monohydrate, (MACROBID) 100 MG capsule Take 100 mg by mouth every other day.     . ciprofloxacin (CIPRO) 250 MG tablet Reported on 04/19/2015  3   No current facility-administered medications for this  visit.    Family History  Problem Relation Age of Onset  . Diabetes Mother   . CVA Father     mild MI  . Hiatal hernia Father   . Migraines Mother     ROS:  Pertinent items are noted in HPI.  Otherwise, a comprehensive ROS was negative.  Exam:   BP 132/80 mmHg  Pulse 88  Resp 16  Ht 4' 11.75" (1.518 m)  Wt 104 lb (47.174 kg)  BMI 20.47 kg/m2  LMP 01/02/1989  Weight change: +4#  Height: 4' 11.75" (151.8 cm)  Ht Readings from Last 3 Encounters:  04/19/15 4' 11.75" (1.518 m)  11/30/14 5' (1.524 m)  08/20/14 5' (1.524 m)    General appearance: alert, cooperative and appears stated age Head: Normocephalic, without obvious abnormality, atraumatic Neck: no adenopathy, supple, symmetrical, trachea midline and thyroid normal to inspection and palpation Lungs: clear to auscultation bilaterally Breasts: normal appearance, no masses or tenderness Heart: regular rate and  rhythm Abdomen: soft, non-tender; bowel sounds normal; no masses,  no organomegaly Extremities: extremities normal, atraumatic, no cyanosis or edema Skin: Skin color, texture, turgor normal. No rashes or lesions Lymph nodes: Cervical, supraclavicular, and axillary nodes normal. No abnormal inguinal nodes palpated Neurologic: Grossly normal   Pelvic: External genitalia:  no lesions              Urethra:  normal appearing urethra with no masses, tenderness or lesions              Bartholins and Skenes: normal                 Vagina: normal appearing vagina with normal color and discharge, no lesions              Cervix: absent              Pap taken: No. Bimanual Exam:  Uterus:  uterus absent              Adnexa: no mass, fullness, tenderness               Rectovaginal: Confirms               Anus:  normal sphincter tone, no lesions  Chaperone was present for exam.  A:  Well Woman with normal exam H/O IC and recurrent UTI-type symptoms with negative cultures. Seeing Dr. Sherron Monday yearly, now. H/O mitral valve repair 1/12 Vaginal atrophy Osteopenia H/O TAH  P: Mammogram yearly pap smear not indicated Labs today. CMP, CBC, TSH, Lipids.  Hep C antibody testing today.  D/W pt having PCP.  Names given. BMD discussed. Pt wants to wait another year or two before repeating.  Declines treatment for now.  Pneumonia vaccine rx given.  Will have pt receive Pneumovax this year. return annually or prn

## 2015-05-14 DIAGNOSIS — Z23 Encounter for immunization: Secondary | ICD-10-CM | POA: Diagnosis not present

## 2015-05-18 DIAGNOSIS — Z1211 Encounter for screening for malignant neoplasm of colon: Secondary | ICD-10-CM | POA: Diagnosis not present

## 2015-05-18 DIAGNOSIS — K641 Second degree hemorrhoids: Secondary | ICD-10-CM | POA: Diagnosis not present

## 2015-05-26 ENCOUNTER — Encounter: Payer: Self-pay | Admitting: Cardiology

## 2015-05-31 DIAGNOSIS — Z1211 Encounter for screening for malignant neoplasm of colon: Secondary | ICD-10-CM | POA: Diagnosis not present

## 2015-05-31 DIAGNOSIS — Z1212 Encounter for screening for malignant neoplasm of rectum: Secondary | ICD-10-CM | POA: Diagnosis not present

## 2015-06-01 ENCOUNTER — Encounter: Payer: Self-pay | Admitting: Cardiology

## 2015-06-01 ENCOUNTER — Ambulatory Visit (INDEPENDENT_AMBULATORY_CARE_PROVIDER_SITE_OTHER): Payer: Medicare Other | Admitting: Cardiology

## 2015-06-01 VITALS — BP 118/78 | HR 86 | Ht 60.0 in | Wt 104.4 lb

## 2015-06-01 DIAGNOSIS — Z9889 Other specified postprocedural states: Secondary | ICD-10-CM | POA: Diagnosis not present

## 2015-06-01 DIAGNOSIS — I340 Nonrheumatic mitral (valve) insufficiency: Secondary | ICD-10-CM | POA: Diagnosis not present

## 2015-06-01 DIAGNOSIS — I5181 Takotsubo syndrome: Secondary | ICD-10-CM | POA: Diagnosis not present

## 2015-06-01 MED ORDER — METOPROLOL SUCCINATE ER 25 MG PO TB24: 25.0000 mg | ORAL_TABLET | Freq: Every day | ORAL | Status: DC

## 2015-06-01 NOTE — Patient Instructions (Signed)

## 2015-06-01 NOTE — Progress Notes (Signed)
[address] [place], [place]  [address] Phone: [phone]) [phone] Fax:  [phone](336) [phone]  Date:  06/01/2015   ID:  Helen SchleinMargaret A Swanson, DOB 1945-08-19, MRN [id number]  PCP:  No PCP Per Patient   History of Present Illness: Helen SchleinMargaret A Swanson is a 70 y.o. female post mitral valve repair due to severe mitral regurgitation/prolapse here for followup. Hospitalization in November 2015 with chest pain, dyspnea, elevated troponin, severely reduced systolic function 15% with preserved function of basal left ventricle are segments, heart catheterization normal coronaries, EF 25%, possible stress-induced cardiomyopathy. There was question whether or not the precipitating event may have been severe migraine headache. Short burst of paroxysmal atrial tachycardia noted.  Beta blocker.   Repeat echocardiogram on 12/10/13 showed normal ejection fraction. Resolution. She is feeling much better.   Several cruises, [employer] (50). [place]  Dr. Leanora IvanoffSusanne Miller OB GYN noted tremor last April.   Overall she is doing very well, no chest pain, no shortness of breath, no syncope. Enjoying cruises.   Wt Readings from Last 3 Encounters:  06/01/15 104 lb 6.4 oz (47.356 kg)  04/19/15 104 lb (47.174 kg)  11/30/14 105 lb 6.4 oz (47.809 kg)     Past Medical History  Diagnosis Date  . Heart murmur   . Mitral regurgitation     a. s/p MV repair 2012.  Marland Kitchen. Hypotension   . Osteopenia   . Tachycardia   . MVP (mitral valve prolapse)     a. s/p MV repair 2012.  . IC (interstitial cystitis)   . Takotsubo cardiomyopathy     a. 11/2013: Stress cardiomyopathy (NICM) - EF 25% by cath, 15% by echo, elevated troponin felt due to this. Normal coronaries by cath 11/25/13.  . Migraine     a. New onset 11/2013.  . Pre-diabetes   . Transaminitis   . Emphysema of lung (HCC)     a. By CXR, nonsmoker.  Marland Kitchen. PAT (paroxysmal atrial tachycardia) (HCC)     a. Brief during 11/2013  hospitalization.  . Tremor   . Pancreatitis     a. At time of MV repair.  Marland Kitchen. Hyperlipidemia     Past Surgical History  Procedure Laterality Date  . Mitral valve repair  1/12    Dr. Cornelius Moraswen  . Dilation and curettage of uterus      x2  . Total abdominal hysterectomy w/ bilateral salpingoophorectomy    . Toe surgery    . Shoulder surgery  5/04    right  . Wrist surgery      left 10/06, right 7/07  . Elbow surgery      right elbow for tendonitis  . Left heart catheterization with coronary angiogram N/A 11/25/2013    Procedure: LEFT HEART CATHETERIZATION WITH CORONARY ANGIOGRAM;  Surgeon: Lesleigh NoeHenry W Smith III, MD;  [facility];  Service: Cardiovascular;  Laterality: N/A;    Current Outpatient Prescriptions  Medication Sig Dispense Refill  . Ascorbic Acid (VITAMIN C PO) Take 1 tablet by mouth daily.    Marland Kitchen. aspirin 81 MG tablet Take 81 mg by mouth daily.    . calcium carbonate 200 MG capsule Take 200 mg by mouth daily.     . metoprolol succinate (TOPROL-XL) 25 MG 24 hr tablet Take 1 tablet (25 mg total) by mouth daily. 90 tablet 3  . Multiple Vitamin (MULTIVITAMIN WITH MINERALS) TABS tablet Take 1 tablet by mouth daily.    . nitrofurantoin,  macrocrystal-monohydrate, (MACROBID) 100 MG capsule Take 100 mg by mouth every other day.     . ciprofloxacin (CIPRO) 250 MG tablet Reported on 06/01/2015  3   No current facility-administered medications for this visit.    Allergies:    Allergies  Allergen Reactions  . Codeine Nausea And Vomiting    NAUSEA    Social History:  The patient  reports that she has never smoked. She has never used smokeless tobacco. She reports that she does not drink alcohol or use illicit drugs.   ROS:  Please see the history of present illness. Positive cough  Denies any syncope, bleeding, orthopnea, PND    PHYSICAL EXAM: VS:  BP 118/78 mmHg  Pulse 86  Ht 5' (1.524 m)  Wt 104 lb 6.4 oz (47.356 kg)  BMI 20.39 kg/m2  LMP 01/02/1989 Well nourished, well  developed, in no acute distress, thin HEENT: normal Neck: no JVD Cardiac:  normal S1, S2; RRR; no murmur Lungs:  clear to auscultation bilaterally, no wheezing, rhonchi or rales Abd: soft, nontender, no hepatomegaly Ext: no edema Skin: warm and dry Neuro: no focal abnormalities, mild tremor noted.   EKG:  Today 11/30/14 sinus rhythm, 90, left axis deviation personally viewed, poor R-wave progression no longer 11/24/13-sinus rhythm, 92, occasional PVC, deeply inverted T waves V3 through V6. T-wave inversion also noted inferior leads. Prior did not show T-wave inversions. Normal rhythm, 95, left axis deviation, no change from prior EKG    ASSESSMENT AND PLAN:  1. November 2015-resolved stress induced cardiomyopathy-EF from 15% now resolved to normal 60% on 12/10/13 echocardiogram. Deeply inverted T waves noted on ECG, elevated troponin as well as cardiac catheterization reassuring with normal coronary arteries.Stopped her lisinopril 2.5 mg, continue with Toprol 25. She states that she does not mind taking low-dose beta blocker. We will continue for now. 2. Status post mitral valve repair-doing well. Antibiotic prophylaxis discussed. No other changes made. 3. Abnormal EKG-previously deeply inverted T waves noted lateral leads. This likely was signal of stress induced cardiomyopathy, Takotsubo.    4. New-onset migraine headache-her mother had migraines. She has not had another migraine since. Excedrin Migraine okay. Neurology has seen, MRI unremarkable. Reassurance. 5. Tremor- Currently, head shaking tremor. Not having any trouble with writing. She does vaguely remember a few years ago one of her roommates during a [employer] trip mentioning that at the end of the day she noted a slight tremor (no longer volunteering). 6. 73-month follow-up.  Signed, Donato Schultz, MD [facility]  06/01/2015 8:24 AM

## 2015-08-03 DIAGNOSIS — H524 Presbyopia: Secondary | ICD-10-CM | POA: Diagnosis not present

## 2015-10-11 DIAGNOSIS — Z23 Encounter for immunization: Secondary | ICD-10-CM | POA: Diagnosis not present

## 2015-12-06 DIAGNOSIS — R350 Frequency of micturition: Secondary | ICD-10-CM | POA: Diagnosis not present

## 2015-12-06 DIAGNOSIS — N3020 Other chronic cystitis without hematuria: Secondary | ICD-10-CM | POA: Diagnosis not present

## 2016-02-02 ENCOUNTER — Other Ambulatory Visit: Payer: Self-pay | Admitting: Obstetrics & Gynecology

## 2016-02-02 DIAGNOSIS — Z1231 Encounter for screening mammogram for malignant neoplasm of breast: Secondary | ICD-10-CM

## 2016-03-02 ENCOUNTER — Ambulatory Visit
Admission: RE | Admit: 2016-03-02 | Discharge: 2016-03-02 | Disposition: A | Discharge status: Home / Self Care | Payer: Medicare Other | Source: Ambulatory Visit | Attending: Obstetrics & Gynecology | Admitting: Obstetrics & Gynecology

## 2016-03-02 DIAGNOSIS — Z1231 Encounter for screening mammogram for malignant neoplasm of breast: Secondary | ICD-10-CM | POA: Diagnosis not present

## 2016-06-01 ENCOUNTER — Other Ambulatory Visit: Payer: Self-pay | Admitting: *Deleted

## 2016-06-01 ENCOUNTER — Ambulatory Visit (INDEPENDENT_AMBULATORY_CARE_PROVIDER_SITE_OTHER): Payer: Medicare Other | Admitting: Cardiology

## 2016-06-01 ENCOUNTER — Encounter: Payer: Self-pay | Admitting: Cardiology

## 2016-06-01 VITALS — BP 116/72 | HR 80 | Ht 60.0 in | Wt 104.4 lb

## 2016-06-01 DIAGNOSIS — Z9889 Other specified postprocedural states: Secondary | ICD-10-CM | POA: Diagnosis not present

## 2016-06-01 DIAGNOSIS — R251 Tremor, unspecified: Secondary | ICD-10-CM | POA: Diagnosis not present

## 2016-06-01 DIAGNOSIS — I5181 Takotsubo syndrome: Secondary | ICD-10-CM | POA: Diagnosis not present

## 2016-06-01 MED ORDER — METOPROLOL SUCCINATE ER 25 MG PO TB24: 25.0000 mg | ORAL_TABLET | Freq: Every day | ORAL | 3 refills | Status: DC

## 2016-06-01 NOTE — Progress Notes (Signed)
[address] [place], [place]  [address] Phone: [phone] Fax:  [phone]  Date:  06/01/2016   ID:  Helen Swanson, DOB [birth year], MRN [id number]  PCP:  Patient, No Pcp Per   History of Present Illness: Helen Swanson is a 71 y.o. female post mitral valve repair due to severe mitral regurgitation/prolapse here for followup. Hospitalization in November 2015 with chest pain, dyspnea, elevated troponin, severely reduced systolic function 15% with preserved function of basal left ventricle are segments, heart catheterization normal coronaries, EF 25%, possible stress-induced cardiomyopathy. There was question whether or not the precipitating event may have been severe migraine headache. Short burst of paroxysmal atrial tachycardia noted.  Beta blocker.   Repeat echocardiogram on 12/10/13 showed normal ejection fraction. Resolution. She is feeling much better.   Several cruises, [employer] (50[place] cruise. [place].  Dr. Leanora Ivanoff OB GYN noted tremor.   Overall she is doing quite well, no specific complaints, no syncope, no bleeding. She is going going on a transatlantic cruise soon. Her dog of 16 years died and she has a new one, Psychiatric nurse.   Wt Readings from Last 3 Encounters:  06/01/16 104 lb 6.4 oz (47.4 kg)  06/01/15 104 lb 6.4 oz (47.4 kg)  04/19/15 104 lb (47.2 kg)     Past Medical History:  Diagnosis Date  . Emphysema of lung (HCC)    a. By CXR, nonsmoker.  Marland Kitchen Heart murmur   . Hyperlipidemia   . Hypotension   . IC (interstitial cystitis)   . Migraine    a. New onset 11/2013.  . Mitral regurgitation    a. s/p MV repair 2012.  Marland Kitchen MVP (mitral valve prolapse)    a. s/p MV repair 2012.  . Osteopenia   . Pancreatitis    a. At time of MV repair.  Marland Kitchen PAT (paroxysmal atrial tachycardia) (HCC)    a. Brief during 11/2013 hospitalization.  . Pre-diabetes   . Tachycardia   . Takotsubo cardiomyopathy    a. 11/2013: Stress  cardiomyopathy (NICM) - EF 25% by cath, 15% by echo, elevated troponin felt due to this. Normal coronaries by cath 11/25/13.  . Transaminitis   . Tremor     Past Surgical History:  Procedure Laterality Date  . DILATION AND CURETTAGE OF UTERUS     x2  . ELBOW SURGERY     right elbow for tendonitis  . LEFT HEART CATHETERIZATION WITH CORONARY ANGIOGRAM N/A 11/25/2013   Procedure: LEFT HEART CATHETERIZATION WITH CORONARY ANGIOGRAM;  Surgeon: Lesleigh Noe, MD;  Location: [facility] CATH LAB;  Service: Cardiovascular;  Laterality: N/A;  . MITRAL VALVE REPAIR  1/12   Dr. Cornelius Moras  . SHOULDER SURGERY  5/04   right  . TOE SURGERY    . TOTAL ABDOMINAL HYSTERECTOMY W/ BILATERAL SALPINGOOPHORECTOMY    . WRIST SURGERY     left 10/06, right 7/07    Current Outpatient Prescriptions  Medication Sig Dispense Refill  . Ascorbic Acid (VITAMIN C PO) Take 1 tablet by mouth daily.    Marland Kitchen aspirin 81 MG tablet Take 81 mg by mouth daily.    . calcium carbonate 200 MG capsule Take 200 mg by mouth daily.     . ciprofloxacin (CIPRO) 250 MG tablet Reported on 06/01/2015  3  . metoprolol succinate (TOPROL-XL) 25 MG 24 hr tablet Take 1 tablet (25 mg total) by mouth daily. 90 tablet 3  . Multiple Vitamin (MULTIVITAMIN WITH MINERALS)  TABS tablet Take 1 tablet by mouth daily.    . nitrofurantoin, macrocrystal-monohydrate, (MACROBID) 100 MG capsule Take 100 mg by mouth every other day.      No current facility-administered medications for this visit.     Allergies:    Allergies  Allergen Reactions  . Codeine Nausea And Vomiting    NAUSEA    Social History:  The patient  reports that she has never smoked. She has never used smokeless tobacco. She reports that she does not drink alcohol or use drugs.   ROS:  Please see the history of present illness. Denies syncope, bleeding, orthopnea, PND  PHYSICAL EXAM: VS:  BP 116/72   Pulse 80   Ht 5' (1.524 m)   Wt 104 lb 6.4 oz (47.4 kg)   LMP 01/02/1989   BMI 20.39  kg/m  GEN: Well nourished, well developed, in no acute distress  HEENT: normal  Neck: no JVD, carotid bruits, or masses Cardiac: RRR; no murmurs, rubs, or gallops,no edema  Respiratory:  clear to auscultation bilaterally, normal work of breathing GI: soft, nontender, nondistended, + BS MS: no deformity or atrophy  Skin: warm and dry, no rash Neuro:  Alert and Oriented x 3, Strength and sensation are intact, mild tremor noted Psych: euthymic mood, full affect   EKG:  Today 06/01/16 with sinus rhythm left anterior fascicular block poor R-wave progression no other abnormalities. Personally viewed-prior 11/30/14 sinus rhythm, 90, left axis deviation personally viewed, poor R-wave progression no longer 11/24/13-sinus rhythm, 92, occasional PVC, deeply inverted T waves V3 through V6. T-wave inversion also noted inferior leads. Prior did not show T-wave inversions. Normal rhythm, 95, left axis deviation, no change from prior EKG    ECHO 12/10/13:  - Left ventricle: The cavity size was normal. Wall thickness was normal. Systolic function was normal. The estimated ejection fraction was in the range of 55% to 60%. Wall motion was normal; there were no regional wall motion abnormalities. - Mitral valve: Prior procedures included surgical repair. There was mild regurgitation.  ASSESSMENT AND PLAN:  Stress-induced cardiomyopathy resolved  - Only symptom was a severe migraine which she has never had since.  - November 2015 EF 15% now normal  - EKG classic with deeply inverted T waves originally with mildly elevated troponin. Catheterization showed normal coronary arteries.  - We are continuing with low-dose Toprol. No longer taking lisinopril.  History of mitral valve repair  - Doing very well. Antibiotic prophylaxis.  - Performed in 2012.  Tremor  -Mild tremor noted. This is been evaluated by her main doctor.   One-year follow-up.   Signed, Donato SchultzMark Javanni Maring, MD [facility]  06/01/2016 8:12 AM

## 2016-06-01 NOTE — Patient Instructions (Signed)

## 2016-07-25 NOTE — Progress Notes (Addendum)
71 y.o. [name] [name] here for annual exam.  Doing well.  Saw Dr. Anne FuSkains in May.  Evaluation was good.  Being seen yearly.  Also seeing Dr. Sherron MondayMacDiarmid.  Has not had an infection this year.  Taking macrobid 100mg  every other day.  If has a "flare" she will increase to daily.    Patient's last menstrual period was 01/02/1989.          Sexually active: No.  The current method of family planning is status post hysterectomy.    Exercising: Yes.    walking Smoker:  no  Health Maintenance: Pap:  09/1999 normal  History of abnormal Pap:  no  MMG:  03/02/16 BIRADS 1 negative  Colonoscopy:  Cologuard 2017 with Dr. Loreta AveMann- normal.  Was due for follow-up colonoscopy.  Will repeat in 3 years.   BMD:   04/14/13 osteopenia TDaP:  2008- pt would like to update today   Pneumonia vaccine(s):  05/14/15  Zostavax:   never Hep C testing: 04/19/15 negative Screening Labs: discuss with provider, Hb today: same   reports that she has never smoked. She has never used smokeless tobacco. She reports that she does not drink alcohol or use drugs.  Past Medical History:  Diagnosis Date  . Emphysema of lung (HCC)    a. By CXR, nonsmoker.  Marland Kitchen. Heart murmur   . Hyperlipidemia   . Hypotension   . IC (interstitial cystitis)   . Migraine    a. New onset 11/2013.  . Mitral regurgitation    a. s/p MV repair 2012.  Marland Kitchen. MVP (mitral valve prolapse)    a. s/p MV repair 2012.  . Osteopenia   . Pancreatitis    a. At time of MV repair.  Marland Kitchen. PAT (paroxysmal atrial tachycardia) (HCC)    a. Brief during 11/2013 hospitalization.  . Pre-diabetes   . Tachycardia   . Takotsubo cardiomyopathy    a. 11/2013: Stress cardiomyopathy (NICM) - EF 25% by cath, 15% by echo, elevated troponin felt due to this. Normal coronaries by cath 11/25/13.  . Transaminitis   . Tremor     Past Surgical History:  Procedure Laterality Date  . DILATION AND CURETTAGE OF UTERUS     x2  . ELBOW SURGERY     right elbow for tendonitis  . LEFT  HEART CATHETERIZATION WITH CORONARY ANGIOGRAM N/A 11/25/2013   Procedure: LEFT HEART CATHETERIZATION WITH CORONARY ANGIOGRAM;  Surgeon: Lesleigh NoeHenry W Smith III, MD;  [facility];  Service: Cardiovascular;  Laterality: N/A;  . MITRAL VALVE REPAIR  1/12   Dr. Cornelius Moraswen  . SHOULDER SURGERY  5/04   right  . TOE SURGERY    . TOTAL ABDOMINAL HYSTERECTOMY W/ BILATERAL SALPINGOOPHORECTOMY    . WRIST SURGERY     left 10/06, right 7/07    Current Outpatient Prescriptions  Medication Sig Dispense Refill  . Ascorbic Acid (VITAMIN C PO) Take 1 tablet by mouth daily.    Marland Kitchen. aspirin 81 MG tablet Take 81 mg by mouth daily.    . calcium carbonate 200 MG capsule Take 200 mg by mouth daily.     . ciprofloxacin (CIPRO) 250 MG tablet as directed per primary physcian  3  . metoprolol succinate (TOPROL-XL) 25 MG 24 hr tablet Take 1 tablet (25 mg total) by mouth daily. 90 tablet 3  . Multiple Vitamin (MULTIVITAMIN WITH MINERALS) TABS tablet Take 1 tablet by mouth daily.    . nitrofurantoin, macrocrystal-monohydrate, (MACROBID) 100 MG capsule Take 100 mg by  mouth every other day.      No current facility-administered medications for this visit.     Family History  Problem Relation Age of Onset  . Diabetes Mother   . Migraines Mother   . CVA Father        mild MI  . Hiatal hernia Father     ROS:  Pertinent items are noted in HPI.  Otherwise, a comprehensive ROS was negative.  Exam:   BP 120/78 (BP Location: Right Arm, Patient Position: Sitting, Cuff Size: Normal)   Pulse 74   Resp 12   Ht 5' (1.524 m)   Wt 104 lb 4 oz (47.3 kg)   LMP 01/02/1989   BMI 20.36 kg/m   Weight change:  No change  Height: 5' (152.4 cm)  Ht Readings from Last 3 Encounters:  07/27/16 5' (1.524 m)  06/01/16 5' (1.524 m)  06/01/15 5' (1.524 m)    General appearance: alert, cooperative and appears stated age Head: Normocephalic, without obvious abnormality, atraumatic Neck: no adenopathy, supple, symmetrical, trachea  midline and thyroid normal to inspection and palpation Lungs: clear to auscultation bilaterally Breasts: normal appearance, no masses or tenderness Abdomen: soft, non-tender; bowel sounds normal; no masses,  no organomegaly Extremities: extremities normal, atraumatic, no cyanosis or edema Skin: Skin color, texture, turgor normal. No rashes or lesions Lymph nodes: Cervical, supraclavicular, and axillary nodes normal. No abnormal inguinal nodes palpated Neurologic: Grossly normal   Pelvic: External genitalia:  no lesions              Urethra:  normal appearing urethra with no masses, tenderness or lesions              Bartholins and Skenes: normal                 Vagina: normal appearing vagina with normal color and discharge, no lesions              Cervix: absent              Pap taken: No. Bimanual Exam:  Uterus:  uterus absent              Adnexa: no mass, fullness, tenderness               Rectovaginal: Confirms               Anus:  normal sphincter tone, no lesions  Chaperone was present for exam.  A:  Well Woman with normal exam H/o IC and recurrent UTI-like symptoms with negative cultures.  Sees Dr. Sherron Monday yearly.  Has appt for follow up in December. H/o mitral valve repair 1/12 Vaginal atrophic changes Osteopenia H/O TAH/BSO   P:   Mammogram guidelines reviewed.  This is UTD. Plan BMD and MMG 3/18 pap smear not indicated Update Tdap today Declines shingles vaccination Sees Dr. Anne Fu yearly.  Was seen 5/18.  Order for pneumovax vaccination given today  Return annually or prn

## 2016-07-27 ENCOUNTER — Encounter: Payer: Self-pay | Admitting: Obstetrics & Gynecology

## 2016-07-27 ENCOUNTER — Ambulatory Visit (INDEPENDENT_AMBULATORY_CARE_PROVIDER_SITE_OTHER): Payer: Medicare Other | Admitting: Obstetrics & Gynecology

## 2016-07-27 VITALS — BP 120/78 | HR 74 | Resp 12 | Ht 60.0 in | Wt 104.2 lb

## 2016-07-27 DIAGNOSIS — E875 Hyperkalemia: Secondary | ICD-10-CM

## 2016-07-27 DIAGNOSIS — Z23 Encounter for immunization: Secondary | ICD-10-CM | POA: Diagnosis not present

## 2016-07-27 DIAGNOSIS — M8580 Other specified disorders of bone density and structure, unspecified site: Secondary | ICD-10-CM | POA: Diagnosis not present

## 2016-07-27 DIAGNOSIS — Z124 Encounter for screening for malignant neoplasm of cervix: Secondary | ICD-10-CM

## 2016-07-27 DIAGNOSIS — N289 Disorder of kidney and ureter, unspecified: Secondary | ICD-10-CM

## 2016-07-27 DIAGNOSIS — Z01419 Encounter for gynecological examination (general) (routine) without abnormal findings: Secondary | ICD-10-CM

## 2016-07-27 NOTE — Patient Instructions (Signed)
Plan a bone density with your next mammogram in 3/19.

## 2016-07-28 LAB — COMPREHENSIVE METABOLIC PANEL
ALBUMIN: 4.8 g/dL (ref 3.6–4.8)
ALK PHOS: 111 IU/L (ref 39–117)
ALT: 15 IU/L (ref 0–32)
AST: 24 IU/L (ref 0–40)
Albumin/Globulin Ratio: 1.8 (ref 1.2–2.2)
BUN/Creatinine Ratio: 20 (ref 12–28)
BUN: 20 mg/dL (ref 8–27)
Bilirubin Total: 0.3 mg/dL (ref 0.0–1.2)
CHLORIDE: 103 mmol/L (ref 96–106)
CO2: 24 mmol/L (ref 20–29)
CREATININE: 0.99 mg/dL (ref 0.57–1.00)
Calcium: 10.7 mg/dL — ABNORMAL HIGH (ref 8.7–10.3)
GFR calc Af Amer: 68 mL/min/{1.73_m2} (ref >59)
GFR calc non Af Amer: 59 mL/min/{1.73_m2} — ABNORMAL LOW (ref >59)
GLUCOSE: 96 mg/dL (ref 65–99)
Globulin, Total: 2.7 g/dL (ref 1.5–4.5)
Potassium: 6 mmol/L — ABNORMAL HIGH (ref 3.5–5.2)
Sodium: 143 mmol/L (ref 134–144)
Total Protein: 7.5 g/dL (ref 6.0–8.5)

## 2016-07-29 NOTE — Addendum Note (Signed)
Addended by: Jerene BearsMILLER, Travoris Bushey S on: 07/29/2016 04:09 PM   Modules accepted: Orders

## 2016-08-03 ENCOUNTER — Other Ambulatory Visit (INDEPENDENT_AMBULATORY_CARE_PROVIDER_SITE_OTHER): Payer: Medicare Other

## 2016-08-03 DIAGNOSIS — E875 Hyperkalemia: Secondary | ICD-10-CM

## 2016-08-04 LAB — COMPREHENSIVE METABOLIC PANEL
ALBUMIN: 4.3 g/dL (ref 3.6–4.8)
ALT: 16 IU/L (ref 0–32)
AST: 25 IU/L (ref 0–40)
Albumin/Globulin Ratio: 2 (ref 1.2–2.2)
Alkaline Phosphatase: 98 IU/L (ref 39–117)
BILIRUBIN TOTAL: 0.2 mg/dL (ref 0.0–1.2)
BUN / CREAT RATIO: 22 (ref 12–28)
BUN: 19 mg/dL (ref 8–27)
CHLORIDE: 104 mmol/L (ref 96–106)
CO2: 21 mmol/L (ref 20–29)
Calcium: 9.9 mg/dL (ref 8.7–10.3)
Creatinine, Ser: 0.85 mg/dL (ref 0.57–1.00)
GFR calc non Af Amer: 71 mL/min/{1.73_m2} (ref >59)
GFR, EST AFRICAN AMERICAN: 81 mL/min/{1.73_m2} (ref >59)
GLUCOSE: 82 mg/dL (ref 65–99)
Globulin, Total: 2.2 g/dL (ref 1.5–4.5)
POTASSIUM: 5 mmol/L (ref 3.5–5.2)
Sodium: 143 mmol/L (ref 134–144)
TOTAL PROTEIN: 6.5 g/dL (ref 6.0–8.5)

## 2016-09-28 DIAGNOSIS — H524 Presbyopia: Secondary | ICD-10-CM | POA: Diagnosis not present

## 2016-12-07 DIAGNOSIS — R350 Frequency of micturition: Secondary | ICD-10-CM | POA: Diagnosis not present

## 2016-12-07 DIAGNOSIS — N3020 Other chronic cystitis without hematuria: Secondary | ICD-10-CM | POA: Diagnosis not present

## 2017-01-29 ENCOUNTER — Other Ambulatory Visit: Payer: Self-pay | Admitting: Obstetrics & Gynecology

## 2017-01-29 DIAGNOSIS — Z139 Encounter for screening, unspecified: Secondary | ICD-10-CM

## 2017-03-05 ENCOUNTER — Ambulatory Visit
Admission: RE | Admit: 2017-03-05 | Discharge: 2017-03-05 | Disposition: A | Discharge status: Home / Self Care | Payer: Medicare Other | Source: Ambulatory Visit | Attending: Obstetrics & Gynecology | Admitting: Obstetrics & Gynecology

## 2017-03-05 DIAGNOSIS — Z780 Asymptomatic menopausal state: Secondary | ICD-10-CM | POA: Diagnosis not present

## 2017-03-05 DIAGNOSIS — M810 Age-related osteoporosis without current pathological fracture: Secondary | ICD-10-CM | POA: Diagnosis not present

## 2017-03-05 DIAGNOSIS — M8580 Other specified disorders of bone density and structure, unspecified site: Secondary | ICD-10-CM

## 2017-03-05 DIAGNOSIS — Z1231 Encounter for screening mammogram for malignant neoplasm of breast: Secondary | ICD-10-CM | POA: Diagnosis not present

## 2017-03-05 DIAGNOSIS — Z139 Encounter for screening, unspecified: Secondary | ICD-10-CM

## 2017-06-04 NOTE — Progress Notes (Signed)
[address] [place], [place]  [address] Phone: [phone] Fax:  [phone]  Date:  06/05/2017   ID:  KAMYIA THOMASON, DOB 1945-05-02, MRN [id number]  PCP:  Patient, No Pcp Per   History of Present Illness: LYGIA OLAES is a 72 y.o. female post mitral valve repair due to severe mitral regurgitation/prolapse here for followup. Hospitalization in November 2015 with chest pain, dyspnea, elevated troponin, severely reduced systolic function 15% with preserved function of basal left ventricle are segments, heart catheterization normal coronaries, EF 25%, possible stress-induced cardiomyopathy. There was question whether or not the precipitating event may have been severe migraine headache. Short burst of paroxysmal atrial tachycardia noted.  Beta blocker.   Repeat echocardiogram on 12/10/13 showed normal ejection fraction. Resolution. She is feeling much better.   Several cruises, [employer] (50[place] cruise. [place].  Dr. Leanora Ivanoff OB GYN noted tremor.   Overall she is doing quite well, no specific complaints, no syncope, no bleeding. She is going going on a trans[place] cruise soon. Her dog of 16 years died and she has a new one, Psychiatric nurse.  06/04/17 -feels great, no medication side effects, no chest pain, no shortness of breath, no syncope, no bleeding.  Overall doing quite well.  She is going to be taking a cruise to [place] and [place].  [place].   Wt Readings from Last 3 Encounters:  06/05/17 104 lb 12.8 oz (47.5 kg)  07/27/16 104 lb 4 oz (47.3 kg)  06/01/16 104 lb 6.4 oz (47.4 kg)     Past Medical History:  Diagnosis Date  . Emphysema of lung (HCC)    a. By CXR, nonsmoker.  Marland Kitchen Heart murmur   . Hyperlipidemia   . Hypotension   . IC (interstitial cystitis)   . Migraine    a. New onset 11/2013.  . Mitral regurgitation    a. s/p MV repair 2012.  Marland Kitchen MVP (mitral valve prolapse)    a. s/p MV repair 2012.  . Osteopenia   . Pancreatitis    a. At time of MV repair.  Marland Kitchen PAT (paroxysmal atrial tachycardia) (HCC)    a. Brief during 11/2013 hospitalization.  . Pre-diabetes   . Tachycardia   . Takotsubo cardiomyopathy    a. 11/2013: Stress cardiomyopathy (NICM) - EF 25% by cath, 15% by echo, elevated troponin felt due to this. Normal coronaries by cath 11/25/13.  . Transaminitis   . Tremor     Past Surgical History:  Procedure Laterality Date  . DILATION AND CURETTAGE OF UTERUS     x2  . ELBOW SURGERY     right elbow for tendonitis  . LEFT HEART CATHETERIZATION WITH CORONARY ANGIOGRAM N/A 11/25/2013   Procedure: LEFT HEART CATHETERIZATION WITH CORONARY ANGIOGRAM;  Surgeon: Lesleigh Noe, MD;  Location: [facility] CATH LAB;  Service: Cardiovascular;  Laterality: N/A;  . MITRAL VALVE REPAIR  1/12   Dr. Cornelius Moras  . SHOULDER SURGERY  5/04   right  . TOE SURGERY    . TOTAL ABDOMINAL HYSTERECTOMY W/ BILATERAL SALPINGOOPHORECTOMY    . WRIST SURGERY     left 10/06, right 7/07    Current Outpatient Medications  Medication Sig Dispense Refill  . Ascorbic Acid (VITAMIN C PO) Take 1 tablet by mouth daily.    Marland Kitchen aspirin 81 MG tablet Take 81 mg by mouth daily.    . calcium carbonate 200 MG capsule Take 200 mg by mouth daily.     Marland Kitchen  ciprofloxacin (CIPRO) 250 MG tablet as directed per primary physcian  3  . metoprolol succinate (TOPROL-XL) 25 MG 24 hr tablet Take 1 tablet (25 mg total) by mouth daily. 90 tablet 3  . Multiple Vitamin (MULTIVITAMIN WITH MINERALS) TABS tablet Take 1 tablet by mouth daily.    . nitrofurantoin, macrocrystal-monohydrate, (MACROBID) 100 MG capsule Take 100 mg by mouth every other day.      No current facility-administered medications for this visit.     Allergies:    Allergies  Allergen Reactions  . Codeine Nausea And Vomiting    NAUSEA    Social History:  The patient  reports that she has never smoked. She has never used smokeless tobacco. She reports that she does not drink alcohol or use drugs.   ROS:   Please see the history of present illness.  No adverse arrhythmias.  All other review of systems negative PHYSICAL EXAM: VS:  BP 104/70   Pulse 77   Ht 5' (1.524 m)   Wt 104 lb 12.8 oz (47.5 kg)   LMP 01/02/1989   BMI 20.47 kg/m  GEN: Thin, well developed, in no acute distress  HEENT: normal  Neck: no JVD, carotid bruits, or masses Cardiac: RRR; no murmurs, rubs, or gallops,no edema  Respiratory:  clear to auscultation bilaterally, normal work of breathing GI: soft, nontender, nondistended, + BS MS: no deformity or atrophy  Skin: warm and dry, no rash Neuro:  Alert and Oriented x 3, Strength and sensation are intact, tremor Psych: euthymic mood, full affect    EKG:  Today 06/05/2017-sinus rhythm right bundle branch block, left anterior fascicular block heart rate 77 bpm, no other changes.  06/01/16 with sinus rhythm left anterior fascicular block poor R-wave progression no other abnormalities. Personally viewed-prior 11/30/14 sinus rhythm, 90, left axis deviation personally viewed, poor R-wave progression no longer 11/24/13-sinus rhythm, 92, occasional PVC, deeply inverted T waves V3 through V6. T-wave inversion also noted inferior leads. Prior did not show T-wave inversions. Normal rhythm, 95, left axis deviation, no change from prior EKG    ECHO 12/10/13:  - Left ventricle: The cavity size was normal. Wall thickness was normal. Systolic function was normal. The estimated ejection fraction was in the range of 55% to 60%. Wall motion was normal; there were no regional wall motion abnormalities. - Mitral valve: Prior procedures included surgical repair. There was mild regurgitation.  ASSESSMENT AND PLAN:  Stress-induced cardiomyopathy resolved  - Only symptom was a severe migraine which she has never had since.  - November 2015 EF 15% now normal  - EKG classic with deeply inverted T waves originally with mildly elevated troponin. Catheterization showed normal coronary  arteries.  - We are continuing with low-dose Toprol. No longer taking lisinopril. Stable, no changes made to medications, continue with the low-dose metoprolol.  History of mitral valve repair  - Doing very well. Antibiotic prophylaxis.  - Performed in 2012.  No murmurs heard on exam.  Last echocardiogram stable.  Tremor  -Mild tremor noted. This is been evaluated by primary doctor.  Overall actually looks improved at today's follow-up.   One-year follow-up.   Signed, Donato SchultzMark Gissel Keilman, MD [facility]  06/05/2017 10:17 AM

## 2017-06-05 ENCOUNTER — Ambulatory Visit: Payer: Medicare Other | Admitting: Cardiology

## 2017-06-05 ENCOUNTER — Encounter: Payer: Self-pay | Admitting: Cardiology

## 2017-06-05 VITALS — BP 104/70 | HR 77 | Ht 60.0 in | Wt 104.8 lb

## 2017-06-05 DIAGNOSIS — R251 Tremor, unspecified: Secondary | ICD-10-CM

## 2017-06-05 DIAGNOSIS — I5181 Takotsubo syndrome: Secondary | ICD-10-CM

## 2017-06-05 DIAGNOSIS — Z9889 Other specified postprocedural states: Secondary | ICD-10-CM

## 2017-06-05 MED ORDER — METOPROLOL SUCCINATE ER 25 MG PO TB24: 25.0000 mg | ORAL_TABLET | Freq: Every day | ORAL | 3 refills | Status: DC

## 2017-06-05 NOTE — Patient Instructions (Signed)

## 2017-09-27 ENCOUNTER — Encounter: Payer: Self-pay | Admitting: Obstetrics & Gynecology

## 2017-09-27 ENCOUNTER — Ambulatory Visit (INDEPENDENT_AMBULATORY_CARE_PROVIDER_SITE_OTHER): Payer: Medicare Other | Admitting: Obstetrics & Gynecology

## 2017-09-27 ENCOUNTER — Other Ambulatory Visit: Payer: Self-pay

## 2017-09-27 VITALS — BP 124/84 | HR 80 | Resp 16 | Ht 60.0 in | Wt 104.2 lb

## 2017-09-27 DIAGNOSIS — Z01419 Encounter for gynecological examination (general) (routine) without abnormal findings: Secondary | ICD-10-CM

## 2017-09-27 NOTE — Progress Notes (Signed)
72 y.o. [name] Single White or Caucasian female here for annual exam.  Seeing Dr. Anne Fu yearly.  She did not need any cardiac evaluation this year.  Also, seeing Dr. Sherron Monday yearly.  No new bladder issues.  Denies vaginal bleeding.   Went in July to [place].  Went to [employer] was fun.  Then she went on a two week trip to [place] and [place].    Patient's last menstrual period was 01/02/1989.          Sexually active: No.  The current method of family planning is status post hysterectomy.    Exercising: Yes.    walk Smoker:  no  Health Maintenance: Pap:  2001 Normal  History of abnormal Pap:  no MMG:  03/05/17 BIRADS1:neg Colonoscopy:  Cologuard 2017 BMD:   03/05/17 Osteoporosis.  Stable.   TDaP:  2018 Pneumonia vaccine(s):  2018 Shingrix:   No (has had shingles, not interested in this right) Hep C testing: 04/19/15 Neg  Screening Labs: PCP   reports that she has never smoked. She has never used smokeless tobacco. She reports that she does not drink alcohol or use drugs.  Past Medical History:  Diagnosis Date  . Emphysema of lung (HCC)    a. By CXR, nonsmoker.  Marland Kitchen Heart murmur   . Hyperlipidemia   . Hypotension   . IC (interstitial cystitis)   . Migraine    a. New onset 11/2013.  . Mitral regurgitation    a. s/p MV repair 2012.  Marland Kitchen MVP (mitral valve prolapse)    a. s/p MV repair 2012.  . Osteopenia   . Pancreatitis    a. At time of MV repair.  Marland Kitchen PAT (paroxysmal atrial tachycardia) (HCC)    a. Brief during 11/2013 hospitalization.  . Pre-diabetes   . Tachycardia   . Takotsubo cardiomyopathy    a. 11/2013: Stress cardiomyopathy (NICM) - EF 25% by cath, 15% by echo, elevated troponin felt due to this. Normal coronaries by cath 11/25/13.  . Transaminitis   . Tremor     Past Surgical History:  Procedure Laterality Date  . DILATION AND CURETTAGE OF UTERUS     x2  . ELBOW SURGERY     right elbow for tendonitis  . LEFT HEART CATHETERIZATION WITH CORONARY ANGIOGRAM  N/A 11/25/2013   Procedure: LEFT HEART CATHETERIZATION WITH CORONARY ANGIOGRAM;  Surgeon: Lesleigh Noe, MD;  Location: [facility] CATH LAB;  Service: Cardiovascular;  Laterality: N/A;  . MITRAL VALVE REPAIR  1/12   Dr. Cornelius Moras  . SHOULDER SURGERY  5/04   right  . TOE SURGERY    . TOTAL ABDOMINAL HYSTERECTOMY W/ BILATERAL SALPINGOOPHORECTOMY    . WRIST SURGERY     left 10/06, right 7/07    Current Outpatient Medications  Medication Sig Dispense Refill  . Ascorbic Acid (VITAMIN C PO) Take 1 tablet by mouth daily.    Marland Kitchen aspirin 81 MG tablet Take 81 mg by mouth daily.    . calcium carbonate 200 MG capsule Take 200 mg by mouth daily.     . ciprofloxacin (CIPRO) 250 MG tablet as directed per primary physcian  3  . metoprolol succinate (TOPROL-XL) 25 MG 24 hr tablet Take 1 tablet (25 mg total) by mouth daily. 90 tablet 3  . Multiple Vitamin (MULTIVITAMIN WITH MINERALS) TABS tablet Take 1 tablet by mouth daily.    . nitrofurantoin, macrocrystal-monohydrate, (MACROBID) 100 MG capsule Take 100 mg by mouth every other day.      No  current facility-administered medications for this visit.     Family History  Problem Relation Age of Onset  . Diabetes Mother   . Migraines Mother   . CVA Father        mild MI  . Hiatal hernia Father     Review of Systems  All other systems reviewed and are negative.   Exam:   BP 124/84 (BP Location: Left Arm, Patient Position: Sitting, Cuff Size: Normal)   Pulse 80   Resp 16   Ht 5' (1.524 m)   Wt 104 lb 3.2 oz (47.3 kg)   LMP 01/02/1989   BMI 20.35 kg/m    Height: 5' (152.4 cm)  Ht Readings from Last 3 Encounters:  09/27/17 5' (1.524 m)  06/05/17 5' (1.524 m)  07/27/16 5' (1.524 m)    General appearance: alert, cooperative and appears stated age Head: Normocephalic, without obvious abnormality, atraumatic Neck: no adenopathy, supple, symmetrical, trachea midline and thyroid normal to inspection and palpation Lungs: clear to auscultation  bilaterally Breasts: normal appearance, no masses or tenderness Heart: regular rate and rhythm Abdomen: soft, non-tender; bowel sounds normal; no masses,  no organomegaly Extremities: extremities normal, atraumatic, no cyanosis or edema Skin: Skin color, texture, turgor normal. No rashes or lesions Lymph nodes: Cervical, supraclavicular, and axillary nodes normal. No abnormal inguinal nodes palpated Neurologic: Grossly normal   Pelvic: External genitalia:  no lesions              Urethra:  normal appearing urethra with no masses, tenderness or lesions              Bartholins and Skenes: normal                 Vagina: normal appearing vagina with normal color and discharge, no lesions              Cervix: absent              Pap taken: No. Bimanual Exam:  Uterus:  uterus absent              Adnexa: no mass, fullness, tenderness               Rectovaginal: Confirms               Anus:  normal sphincter tone, no lesions  Chaperone was present for exam.  A:  Well Woman with normal exam H/O IC and recurrent UTI like symptoms.  Seeing Dr. Sherron Monday yearly. Osteoporosis, stable with BDM 2019 H/O mitral valve repair 1/12 Vaginal atrophy H/o TAH/BSO  P:   Mammogram guidelines reviewed. pap smear not indicated Vaccines are UTD.  Not interested in shingrix vaccines.   Seeing Dr. Anne Fu yearly. Lab work it UTD return annually or prn

## 2017-10-03 DIAGNOSIS — Z23 Encounter for immunization: Secondary | ICD-10-CM | POA: Diagnosis not present

## 2017-11-13 DIAGNOSIS — H5213 Myopia, bilateral: Secondary | ICD-10-CM | POA: Diagnosis not present

## 2017-12-13 DIAGNOSIS — N3020 Other chronic cystitis without hematuria: Secondary | ICD-10-CM | POA: Diagnosis not present

## 2018-01-28 ENCOUNTER — Other Ambulatory Visit: Payer: Self-pay | Admitting: Obstetrics & Gynecology

## 2018-01-28 DIAGNOSIS — Z1231 Encounter for screening mammogram for malignant neoplasm of breast: Secondary | ICD-10-CM

## 2018-03-08 ENCOUNTER — Ambulatory Visit
Admission: RE | Admit: 2018-03-08 | Discharge: 2018-03-08 | Disposition: A | Discharge status: Home / Self Care | Payer: Medicare Other | Source: Ambulatory Visit | Attending: Obstetrics & Gynecology | Admitting: Obstetrics & Gynecology

## 2018-03-08 DIAGNOSIS — Z1231 Encounter for screening mammogram for malignant neoplasm of breast: Secondary | ICD-10-CM | POA: Diagnosis not present

## 2018-05-10 ENCOUNTER — Telehealth (INDEPENDENT_AMBULATORY_CARE_PROVIDER_SITE_OTHER): Payer: Medicare Other | Admitting: Cardiology

## 2018-05-10 ENCOUNTER — Other Ambulatory Visit: Payer: Self-pay

## 2018-05-10 ENCOUNTER — Encounter: Payer: Self-pay | Admitting: Cardiology

## 2018-05-10 VITALS — Ht 60.0 in | Wt 103.5 lb

## 2018-05-10 DIAGNOSIS — Z9889 Other specified postprocedural states: Secondary | ICD-10-CM | POA: Diagnosis not present

## 2018-05-10 DIAGNOSIS — I5181 Takotsubo syndrome: Secondary | ICD-10-CM

## 2018-05-10 DIAGNOSIS — R251 Tremor, unspecified: Secondary | ICD-10-CM

## 2018-05-10 MED ORDER — METOPROLOL SUCCINATE ER 25 MG PO TB24: 25.0000 mg | ORAL_TABLET | Freq: Every day | ORAL | 3 refills | Status: DC

## 2018-05-10 NOTE — Patient Instructions (Signed)
Medication Instructions:  Your physician recommends that you continue on your current medications as directed. Please refer to the Current Medication list given to you today. A refill of your Metoprolol (Toprol XL) has been sent to your pharmacy  Lab work: None Ordered   Testing/Procedures: None Ordered   Follow-Up: At [facility], you and your health needs are our priority.  As part of our continuing mission to provide you with exceptional heart care, we have created designated Provider Care Teams.  These Care Teams include your primary Cardiologist (physician) and Advanced Practice Providers (APPs -  Physician Assistants and Nurse Practitioners) who all work together to provide you with the care you need, when you need it. You will need a follow up appointment in 1 years.  Please call our office 2 months in advance to schedule this appointment.  You may see Dr. Anne Fu or one of the following Advanced Practice Providers on your designated Care Team:   Norma Fredrickson, NP Nada Boozer, NP . Georgie Chard, NP

## 2018-05-10 NOTE — Progress Notes (Signed)
Virtual Visit via Video Note   This visit type was conducted due to national recommendations for restrictions regarding the COVID-19 Pandemic (e.g. social distancing) in an effort to limit this patient's exposure and mitigate transmission in our community.  Due to her co-morbid illnesses, this patient is at least at moderate risk for complications without adequate follow up.  This format is felt to be most appropriate for this patient at this time.  All issues noted in this document were discussed and addressed.  A limited physical exam was performed with this format.  Please refer to the patient's chart for her consent to telehealth for [facility].   Date:  05/10/2018   ID:  Helen Swanson, DOB 13-Sep-1945, MRN [id number]  Patient Location: Home Provider Location: Home  PCP:  Patient, No Pcp Per  Cardiologist:  Donato Schultz, MD  Electrophysiologist:  None   Evaluation Performed:  Follow-Up Visit  Chief Complaint: Follow-up visit previous stress-induced cardiomyopathy, mitral valve repair  History of Present Illness:    Helen Swanson is a 73 y.o. female with mitral valve repair in 2015 secondary to severe mitral vegetation, back in November 2015 had an episode of elevated troponin with severely reduced ejection fraction of 15%, Takotsubo like, cath normal coronary arteries, migraine precipitated the event.  Repeat echocardiogram in December 2015 showed normal ejection fraction.  Also had an episode of paroxysmal atrial tachycardia during hospitalization in 2015.  Has been battling a tremor.  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 Last one 01/2018.   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    Past Medical History:  Diagnosis Date  . Emphysema of lung (HCC)    a. By CXR, nonsmoker.  Marland Kitchen Heart murmur   . Hyperlipidemia   . Hypotension   . IC  (interstitial cystitis)   . Migraine    a. New onset 11/2013.  . Mitral regurgitation    a. s/p MV repair 2012.  Marland Kitchen MVP (mitral valve prolapse)    a. s/p MV repair 2012.  . Osteopenia   . Pancreatitis    a. At time of MV repair.  Marland Kitchen PAT (paroxysmal atrial tachycardia) (HCC)    a. Brief during 11/2013 hospitalization.  . Pre-diabetes   . Tachycardia   . Takotsubo cardiomyopathy    a. 11/2013: Stress cardiomyopathy (NICM) - EF 25% by cath, 15% by echo, elevated troponin felt due to this. Normal coronaries by cath 11/25/13.  . Transaminitis   . Tremor    Past Surgical History:  Procedure Laterality Date  . DILATION AND CURETTAGE OF UTERUS     x2  . ELBOW SURGERY     right elbow for tendonitis  . LEFT HEART CATHETERIZATION WITH CORONARY ANGIOGRAM N/A 11/25/2013   Procedure: LEFT HEART CATHETERIZATION WITH CORONARY ANGIOGRAM;  Surgeon: Lesleigh Noe, MD;  Location: [facility] C[facility] LAB;  Service: Cardiovascular;  Laterality: N/A;  . MITRAL VALVE REPAIR  1/12   Dr. Cornelius Moras  . SHOULDER SURGERY  5/04   right  . TOE SURGERY    . TOTAL ABDOMINAL HYSTERECTOMY W/ BILATERAL SALPINGOOPHORECTOMY    . WRIST SURGERY     left 10/06, right 7/07     Current Meds  Medication Sig  . Ascorbic Acid (VITAMIN C PO) Take 1 tablet by mouth daily.  Marland Kitchen aspirin 81 MG tablet Take 81 mg by mouth  daily.  . calcium carbonate 200 MG capsule Take 200 mg by mouth daily.   . ciprofloxacin (CIPRO) 250 MG tablet as directed per primary physcian  . metoprolol succinate (TOPROL-XL) 25 MG 24 hr tablet Take 1 tablet (25 mg total) by mouth daily.  . Multiple Vitamin (MULTIVITAMIN WITH MINERALS) TABS tablet Take 1 tablet by mouth daily.  . nitrofurantoin, macrocrystal-monohydrate, (MACROBID) 100 MG capsule Take 100 mg by mouth every other day.   . [DISCONTINUED] metoprolol succinate (TOPROL-XL) 25 MG 24 hr tablet Take 1 tablet (25 mg total) by mouth daily.     Allergies:   Codeine   Social History   Tobacco Use  .  Smoking status: Never Smoker  . Smokeless tobacco: Never Used  Substance Use Topics  . Alcohol use: No  . Drug use: No     Family Hx: The patient's family history includes CVA in her father; Diabetes in her mother; Hiatal hernia in her father; Migraines in her mother.  ROS:   Please see the history of present illness.    No chest pain fevers chills nausea vomiting shortness of breath.  Ambulating well.  Still with tremor. All other systems reviewed and are negative.   Prior CV studies:   The following studies were reviewed today:  Cardiac catheterization-no CAD in 2015, Takotsubo cardiomyopathy, subsequent echocardiogram reviewed and resolved.  Labs/Other Tests and Data Reviewed:    EKG:  Prior EKG from 2019 reviewed-right bundle branch block left anterior fascicular block sinus rhythm.  Heart rate in the 70s.  Recent Labs: No results found for requested labs within last 8760 hours.   Recent Lipid Panel Lab Results  Component Value Date/Time   CHOL 239 (H) 04/19/2015 01:41 PM   TRIG 172 (H) 04/19/2015 01:41 PM   HDL 65 04/19/2015 01:41 PM   CHOLHDL 3.7 04/19/2015 01:41 PM   LDLCALC 140 (H) 04/19/2015 01:41 PM    Wt Readings from Last 3 Encounters:  05/10/18 103 lb 8 oz (46.9 kg)  09/27/17 104 lb 3.2 oz (47.3 kg)  06/05/17 104 lb 12.8 oz (47.5 kg)     Objective:    Vital Signs:  Ht 5' (1.524 m)   Wt 103 lb 8 oz (46.9 kg)   LMP 01/02/1989   BMI 20.21 kg/m    VITAL SIGNS:  reviewed GEN:  no acute distress EYES:  sclerae anicteric, EOMI - Extraocular Movements Intact RESPIRATORY:  normal respiratory effort, symmetric expansion CARDIOVASCULAR:  no peripheral edema SKIN:  no rash, lesions or ulcers. MUSCULOSKELETAL:  no obvious deformities. NEURO:  alert and oriented x 3, no obvious focal deficit PSYCH:  normal affect Noted mild tremor while she was holding phone.  ASSESSMENT & PLAN:    Mitral valve repair - Dental antibiotics.  Doing well.  2012.   Excellent echocardiogram.  No symptoms.  Prior stress-induced/Takotsubo cardiomyopathy - EF 15% at one point.  Classic.  T wave inversions noted.  CAD negative.  Resolved. - Continue with Toprol.  No longer taking lisinopril.  Blood pressure soft. Refill.   Tremor - Continuing to monitor.  Stable.  Right bundle branch block/left anterior fascicular block/bifascicular block -No syncope.  Stable.  We will check EKG at next visit.  Continue with low-dose Toprol.  COVID-19 Education: The signs and symptoms of COVID-19 were discussed with the patient and how to seek care for testing (follow up with PCP or arrange E-visit).  The importance of social distancing was discussed today.  Time:   Today, I have  spent 15 minutes with the patient with telehealth technology discussing the above problems.     Medication Adjustments/Labs and Tests Ordered: Current medicines are reviewed at length with the patient today.  Concerns regarding medicines are outlined above.   Tests Ordered: No orders of the defined types were placed in this encounter.   Medication Changes: Meds ordered this encounter  Medications  . metoprolol succinate (TOPROL-XL) 25 MG 24 hr tablet    Sig: Take 1 tablet (25 mg total) by mouth daily.    Dispense:  90 tablet    Refill:  3    Disposition:  Follow up in 1 year(s)  Signed, Donato SchultzMark , MD  05/10/2018 9:07 AM    [facility]

## 2018-06-07 ENCOUNTER — Ambulatory Visit: Payer: Medicare Other | Admitting: Cardiology

## 2018-09-17 DIAGNOSIS — Z23 Encounter for immunization: Secondary | ICD-10-CM | POA: Diagnosis not present

## 2018-12-18 DIAGNOSIS — R350 Frequency of micturition: Secondary | ICD-10-CM | POA: Diagnosis not present

## 2018-12-18 DIAGNOSIS — N3020 Other chronic cystitis without hematuria: Secondary | ICD-10-CM | POA: Diagnosis not present

## 2018-12-19 LAB — IFOBT (OCCULT BLOOD): IFOBT: NEGATIVE

## 2019-01-23 ENCOUNTER — Other Ambulatory Visit: Payer: Self-pay | Admitting: Obstetrics & Gynecology

## 2019-01-23 DIAGNOSIS — Z1231 Encounter for screening mammogram for malignant neoplasm of breast: Secondary | ICD-10-CM

## 2019-01-30 DIAGNOSIS — H5213 Myopia, bilateral: Secondary | ICD-10-CM | POA: Diagnosis not present

## 2019-02-02 ENCOUNTER — Ambulatory Visit: Payer: Medicare Other

## 2019-02-07 ENCOUNTER — Ambulatory Visit: Payer: Medicare Other | Admitting: Obstetrics & Gynecology

## 2019-02-07 ENCOUNTER — Ambulatory Visit: Payer: Medicare Other | Attending: Internal Medicine

## 2019-02-07 DIAGNOSIS — Z23 Encounter for immunization: Secondary | ICD-10-CM

## 2019-02-07 NOTE — Progress Notes (Signed)
   Covid-19 Vaccination Clinic  Name:  KASHINA MECUM    MRN: [id number] DOB: [birth year]  02/07/2019  Ms. Santarelli was observed post Covid-19 immunization for 15 minutes without incidence. She was provided with Vaccine Information Sheet and instruction to access the V-Safe system.   Ms. Pierro was instructed to call 911 with any severe reactions post vaccine: Marland Kitchen Difficulty breathing  . Swelling of your face and throat  . A fast heartbeat  . A bad rash all over your body  . Dizziness and weakness    Immunizations Administered    Name Date Dose VIS Date Route   Pfizer COVID-19 Vaccine 02/07/2019 10:13 AM 0.3 mL 12/13/2018 Intramuscular   Manufacturer: [employer], [employer]   Lot: [id number]   NDC: [address]-8

## 2019-02-23 ENCOUNTER — Ambulatory Visit: Payer: Medicare Other

## 2019-03-05 ENCOUNTER — Ambulatory Visit: Payer: Medicare Other | Attending: Internal Medicine

## 2019-03-05 DIAGNOSIS — Z23 Encounter for immunization: Secondary | ICD-10-CM | POA: Insufficient documentation

## 2019-03-05 NOTE — Progress Notes (Signed)
   Covid-19 Vaccination Clinic  Name:  Helen Swanson    MRN: [id number] DOB: 1945/04/29  03/05/2019  [name] was observed post Covid-19 immunization for 15 minutes without incident. She was provided with Vaccine Information Sheet and instruction to access the V-Safe system.   [name] was instructed to call 911 with any severe reactions post vaccine: Marland Kitchen Difficulty breathing  . Swelling of face and throat  . A fast heartbeat  . A bad rash all over body  . Dizziness and weakness   Immunizations Administered    Name Date Dose VIS Date Route   Pfizer COVID-19 Vaccine 03/05/2019  8:47 AM 0.3 mL 12/13/2018 Intramuscular   Manufacturer: [employer], [employer]   Lot: [id number]   NDC: [address]-0

## 2019-03-13 ENCOUNTER — Ambulatory Visit
Admission: RE | Admit: 2019-03-13 | Discharge: 2019-03-13 | Disposition: A | Discharge status: Home / Self Care | Payer: Medicare Other | Source: Ambulatory Visit | Attending: Obstetrics & Gynecology | Admitting: Obstetrics & Gynecology

## 2019-03-13 ENCOUNTER — Other Ambulatory Visit: Payer: Self-pay

## 2019-03-13 DIAGNOSIS — Z1231 Encounter for screening mammogram for malignant neoplasm of breast: Secondary | ICD-10-CM

## 2019-04-18 NOTE — Progress Notes (Signed)
74 y.o. [name] Single White or Caucasian female here for annual exam.  Doing well.  Going to [facility] next week for two weeks.  Has a cruise scheduled for cruise out of [place] to [place] and [place].  Also has a scheduled cruise to the [place] in January, 2022.  Finished Covid vaccination 03/05/2019.  Continues to see Dr. Anne Fu yearly and Dr. Sherron Monday yearly.    Denies vaginal bleeding.    Patient's last menstrual period was 01/02/1989.          Sexually active: No.  The current method of family planning is status post hysterectomy.    Exercising: Yes.    walking Smoker:  no  Health Maintenance: Pap:  2001 normal History of abnormal Pap:  no MMG:  03-13-2019 category c density birads 1:neg Colonoscopy:  cologuard 2017 neg, [employer] Fecal immunochemical test (FIT)  12-07-2018 neg BMD:  2019 osteoporosis TDaP: 2018 Pneumonia vaccine(s):  2018 Shingrix:   Not done Hep C testing: neg 2017 Screening Labs: obtained today   reports that she has never smoked. She has never used smokeless tobacco. She reports that she does not drink alcohol or use drugs.  Past Medical History:  Diagnosis Date  . Emphysema of lung (HCC)    a. By CXR, nonsmoker.  Marland Kitchen Heart murmur   . Hyperlipidemia   . Hypotension   . IC (interstitial cystitis)   . Migraine    a. New onset 11/2013.  . Mitral regurgitation    a. s/p MV repair 2012.  Marland Kitchen MVP (mitral valve prolapse)    a. s/p MV repair 2012.  . Osteopenia   . Pancreatitis    a. At time of MV repair.  Marland Kitchen PAT (paroxysmal atrial tachycardia) (HCC)    a. Brief during 11/2013 hospitalization.  . Pre-diabetes   . Tachycardia   . Takotsubo cardiomyopathy    a. 11/2013: Stress cardiomyopathy (NICM) - EF 25% by cath, 15% by echo, elevated troponin felt due to this. Normal coronaries by cath 11/25/13.  . Transaminitis   . Tremor     Past Surgical History:  Procedure Laterality Date  . DILATION AND CURETTAGE OF UTERUS     x2  . ELBOW SURGERY     right  elbow for tendonitis  . LEFT HEART CATHETERIZATION WITH CORONARY ANGIOGRAM N/A 11/25/2013   Procedure: LEFT HEART CATHETERIZATION WITH CORONARY ANGIOGRAM;  Surgeon: Lesleigh Noe, MD;  Location: [facility] CATH LAB;  Service: Cardiovascular;  Laterality: N/A;  . MITRAL VALVE REPAIR  1/12   Dr. Cornelius Moras  . SHOULDER SURGERY  5/04   right  . TOE SURGERY    . TOTAL ABDOMINAL HYSTERECTOMY W/ BILATERAL SALPINGOOPHORECTOMY    . WRIST SURGERY     left 10/06, right 7/07    Current Outpatient Medications  Medication Sig Dispense Refill  . Ascorbic Acid (VITAMIN C PO) Take 1 tablet by mouth daily.    Marland Kitchen aspirin 81 MG tablet Take 81 mg by mouth daily.    . calcium carbonate 200 MG capsule Take 200 mg by mouth daily.     . metoprolol succinate (TOPROL-XL) 25 MG 24 hr tablet Take 1 tablet (25 mg total) by mouth daily. 90 tablet 3  . Multiple Vitamin (MULTIVITAMIN WITH MINERALS) TABS tablet Take 1 tablet by mouth daily.    . nitrofurantoin, macrocrystal-monohydrate, (MACROBID) 100 MG capsule Take 100 mg by mouth every other day.     . ciprofloxacin (CIPRO) 250 MG tablet as directed per primary physcian  3   No current facility-administered medications for this visit.    Family History  Problem Relation Age of Onset  . Diabetes Mother   . Migraines Mother   . CVA Father        mild MI  . Hiatal hernia Father     Review of Systems  Constitutional: Negative.   HENT: Negative.   Eyes: Negative.   Respiratory: Negative.   Cardiovascular: Negative.   Gastrointestinal: Negative.   Endocrine: Negative.   Genitourinary: Negative.   Musculoskeletal: Negative.   Skin: Negative.   Allergic/Immunologic: Negative.   Neurological: Negative.   Psychiatric/Behavioral: Negative.     Exam:   BP 122/80   Pulse 68   Temp (!) 97.3 F (36.3 C) (Skin)   Resp 16   Ht 5' 0.75" (1.543 m)   Wt 105 lb (47.6 kg)   LMP 01/02/1989   BMI 20.00 kg/m   Height:   Height: 5' 0.75" (154.3 cm)  Ht Readings from Last  3 Encounters:  04/24/19 5' 0.75" (1.543 m)  05/10/18 5' (1.524 m)  09/27/17 5' (1.524 m)    General appearance: alert, cooperative and appears stated age Head: Normocephalic, without obvious abnormality, atraumatic Neck: no adenopathy, supple, symmetrical, trachea midline and thyroid normal to inspection and palpation Lungs: clear to auscultation bilaterally Breasts: normal appearance, no masses or tenderness Heart: regular rate and rhythm Abdomen: soft, non-tender; bowel sounds normal; no masses,  no organomegaly Extremities: extremities normal, atraumatic, no cyanosis or edema Skin: Skin color, texture, turgor normal. No rashes or lesions Lymph nodes: Cervical, supraclavicular, and axillary nodes normal. No abnormal inguinal nodes palpated Neurologic: Grossly normal   Pelvic: External genitalia:  no lesions              Urethra:  normal appearing urethra with no masses, tenderness or lesions              Bartholins and Skenes: normal                 Vagina: normal appearing vagina with normal color and discharge, no lesions              Cervix: absent              Pap taken: No. Bimanual Exam:  Uterus:  uterus absent              Adnexa: no mass, fullness, tenderness               Rectovaginal: Confirms               Anus:  normal sphincter tone, no lesions  Chaperone, Terence Lux, CMA, was present for exam.  A:  Well Woman with normal exam H/o IC and recurrent UTI-like symptoms.  Sees Dr. Lorra Hals yearly. Osteoporosis, stable with BMD 2019 H/o mitral valve repair 1/12 vaginal atrophy  H/o TAH/BSO  P:   Mammogram guidelines reviewed.  This is UTD. pap smear updated Vaccines reviewed.  Declines shingrix vaccination FIT test obtained 12/2018.  Will plan cologuard this December. CBC, CMP, lipids obtained today Plan BMD with next MMG 03/2019.  Order placed. Return annually or prn

## 2019-04-24 ENCOUNTER — Ambulatory Visit (INDEPENDENT_AMBULATORY_CARE_PROVIDER_SITE_OTHER): Payer: Medicare Other | Admitting: Obstetrics & Gynecology

## 2019-04-24 ENCOUNTER — Other Ambulatory Visit: Payer: Self-pay

## 2019-04-24 ENCOUNTER — Encounter: Payer: Self-pay | Admitting: Obstetrics & Gynecology

## 2019-04-24 VITALS — BP 122/80 | HR 68 | Temp 97.3°F | Resp 16 | Ht 60.75 in | Wt 105.0 lb

## 2019-04-24 DIAGNOSIS — E7800 Pure hypercholesterolemia, unspecified: Secondary | ICD-10-CM | POA: Diagnosis not present

## 2019-04-24 DIAGNOSIS — R748 Abnormal levels of other serum enzymes: Secondary | ICD-10-CM

## 2019-04-24 DIAGNOSIS — R7989 Other specified abnormal findings of blood chemistry: Secondary | ICD-10-CM | POA: Diagnosis not present

## 2019-04-24 DIAGNOSIS — E875 Hyperkalemia: Secondary | ICD-10-CM

## 2019-04-24 DIAGNOSIS — Z01419 Encounter for gynecological examination (general) (routine) without abnormal findings: Secondary | ICD-10-CM | POA: Diagnosis not present

## 2019-04-24 DIAGNOSIS — M810 Age-related osteoporosis without current pathological fracture: Secondary | ICD-10-CM

## 2019-04-25 LAB — LIPID PANEL
Chol/HDL Ratio: 3.6 ratio (ref 0.0–4.4)
Cholesterol, Total: 209 mg/dL — ABNORMAL HIGH (ref 100–199)
HDL: 58 mg/dL (ref >39)
LDL Chol Calc (NIH): 124 mg/dL — ABNORMAL HIGH (ref 0–99)
Triglycerides: 155 mg/dL — ABNORMAL HIGH (ref 0–149)
VLDL Cholesterol Cal: 27 mg/dL (ref 5–40)

## 2019-04-25 LAB — COMPREHENSIVE METABOLIC PANEL
ALT: 26 IU/L (ref 0–32)
AST: 23 IU/L (ref 0–40)
Albumin/Globulin Ratio: 1.8 (ref 1.2–2.2)
Albumin: 4.6 g/dL (ref 3.7–4.7)
Alkaline Phosphatase: 122 IU/L — ABNORMAL HIGH (ref 39–117)
BUN/Creatinine Ratio: 25 (ref 12–28)
BUN: 22 mg/dL (ref 8–27)
Bilirubin Total: 0.3 mg/dL (ref 0.0–1.2)
CO2: 25 mmol/L (ref 20–29)
Calcium: 10.4 mg/dL — ABNORMAL HIGH (ref 8.7–10.3)
Chloride: 102 mmol/L (ref 96–106)
Creatinine, Ser: 0.88 mg/dL (ref 0.57–1.00)
GFR calc Af Amer: 76 mL/min/{1.73_m2} (ref >59)
GFR calc non Af Amer: 66 mL/min/{1.73_m2} (ref >59)
Globulin, Total: 2.6 g/dL (ref 1.5–4.5)
Glucose: 87 mg/dL (ref 65–99)
Potassium: 5.5 mmol/L — ABNORMAL HIGH (ref 3.5–5.2)
Sodium: 144 mmol/L (ref 134–144)
Total Protein: 7.2 g/dL (ref 6.0–8.5)

## 2019-04-25 LAB — CBC
Hematocrit: 45.1 % (ref 34.0–46.6)
Hemoglobin: 15.1 g/dL (ref 11.1–15.9)
MCH: 29 pg (ref 26.6–33.0)
MCHC: 33.5 g/dL (ref 31.5–35.7)
MCV: 87 fL (ref 79–97)
Platelets: 327 10*3/uL (ref 150–450)
RBC: 5.21 x10E6/uL (ref 3.77–5.28)
RDW: 12.6 % (ref 11.7–15.4)
WBC: 5.5 10*3/uL (ref 3.4–10.8)

## 2019-04-29 ENCOUNTER — Telehealth: Payer: Self-pay

## 2019-04-29 NOTE — Addendum Note (Signed)
Addended by: Jerene Bears on: 04/29/2019 11:55 AM   Modules accepted: Orders

## 2019-04-29 NOTE — Telephone Encounter (Signed)
Left message for call back.

## 2019-04-29 NOTE — Telephone Encounter (Signed)
-----  Message from Megan Salon, MD sent at 04/29/2019 11:55 AM EDT ----- Please let pt know her lipids were 209 and LDLs 129.  The [employer] of Cardiology/American Heart Association risk caluclation for her shows her risk of a cardiovascular event in the next 10 years is 9.7%.  Treatment is recommended for this level.  Please confirm this was fasting.  If not, should repeat.  If it was, she may need to establish care with PCP now. Her CBC was normal.  Her CMP showed mildly elevated alk phos (gall bladder or bone test), calcium was mildly elevated at 10.4 and potassium was 5.5 (nl is <5.2).  Needs repeat CMP in 2 weeks to recheck these levels.  Order placed for this lab test.  Thanks.

## 2019-05-01 NOTE — Telephone Encounter (Signed)
Left message for call back.

## 2019-05-02 NOTE — Telephone Encounter (Signed)
Left message for call back.

## 2019-05-05 NOTE — Telephone Encounter (Signed)
Left message for call back.

## 2019-05-08 NOTE — Telephone Encounter (Signed)
Patient notified of results. See lab 

## 2019-05-16 ENCOUNTER — Other Ambulatory Visit: Payer: Self-pay

## 2019-05-16 ENCOUNTER — Ambulatory Visit: Payer: Medicare Other | Admitting: Cardiology

## 2019-05-16 ENCOUNTER — Encounter: Payer: Self-pay | Admitting: Cardiology

## 2019-05-16 VITALS — BP 120/70 | HR 82 | Ht 60.0 in | Wt 105.8 lb

## 2019-05-16 DIAGNOSIS — E782 Mixed hyperlipidemia: Secondary | ICD-10-CM

## 2019-05-16 DIAGNOSIS — Z9889 Other specified postprocedural states: Secondary | ICD-10-CM | POA: Diagnosis not present

## 2019-05-16 DIAGNOSIS — I5181 Takotsubo syndrome: Secondary | ICD-10-CM

## 2019-05-16 MED ORDER — METOPROLOL SUCCINATE ER 25 MG PO TB24: 25.0000 mg | ORAL_TABLET | Freq: Every day | ORAL | 3 refills | Status: DC

## 2019-05-16 NOTE — Patient Instructions (Signed)
Medication Instructions:   Your physician recommends that you continue on your current medications as directed. Please refer to the Current Medication list given to you today.  *If you need a refill on your cardiac medications before your next appointment, please call your pharmacy*   Follow-Up: At [facility], you and your health needs are our priority.  As part of our continuing mission to provide you with exceptional heart care, we have created designated Provider Care Teams.  These Care Teams include your primary Cardiologist (physician) and Advanced Practice Providers (APPs -  Physician Assistants and Nurse Practitioners) who all work together to provide you with the care you need, when you need it.  We recommend signing up for the patient portal called "MyChart".  Sign up information is provided on this After Visit Summary.  MyChart is used to connect with patients for Virtual Visits (Telemedicine).  Patients are able to view lab/test results, encounter notes, upcoming appointments, etc.  Non-urgent messages can be sent to your provider as well.   To learn more about what you can do with MyChart, go to [email or web address].    Your next appointment:   12 month(s)  The format for your next appointment:   In Person  Provider:   Dr. Anne Fu

## 2019-05-16 NOTE — Progress Notes (Signed)
Cardiology Office Note:    Date:  05/16/2019   ID:  Helen Swanson, DOB Mar 23, 1945, MRN [id number]  PCP:  Patient, No Pcp Per  Cardiologist:  Donato Schultz, MD  Electrophysiologist:  None   Referring MD: No ref. provider found     History of Present Illness:    Helen Swanson is a 74 y.o. female here for the follow-up of mitral valve repair 2015 secondary to severe mitral regurgitation.  Previously had an episode of Takotsubo like cardiomyopathy with a EF of 15% that resolved.  Migraine preceded event.  Had paroxysmal atrial tachycardia during hospitalization in 2015 as well.  Has battled tremor over the past few years.  Enjoys cruising with [employer] over 70 cruises.  Last one was in January 2020.  Her dog's name is scooter.  Really helped her out during the pandemic.  Helen Swanson friend cruise to [place], [place], [place].   Past Medical History:  Diagnosis Date  . Emphysema of lung (HCC)    a. By CXR, nonsmoker.  Marland Kitchen Heart murmur   . Hyperlipidemia   . Hypotension   . IC (interstitial cystitis)   . Migraine    a. New onset 11/2013.  . Mitral regurgitation    a. s/p MV repair 2012.  Marland Kitchen MVP (mitral valve prolapse)    a. s/p MV repair 2012.  . Osteopenia   . Osteoporosis   . Pancreatitis    a. At time of MV repair.  Marland Kitchen PAT (paroxysmal atrial tachycardia) (HCC)    a. Brief during 11/2013 hospitalization.  . Pre-diabetes   . Shingles   . Tachycardia   . Takotsubo cardiomyopathy    a. 11/2013: Stress cardiomyopathy (NICM) - EF 25% by cath, 15% by echo, elevated troponin felt due to this. Normal coronaries by cath 11/25/13.  . Transaminitis   . Tremor     Past Surgical History:  Procedure Laterality Date  . DILATION AND CURETTAGE OF UTERUS     x2  . ELBOW SURGERY     right elbow for tendonitis  . LEFT HEART CATHETERIZATION WITH CORONARY ANGIOGRAM N/A 11/25/2013   Procedure: LEFT HEART CATHETERIZATION WITH CORONARY ANGIOGRAM;  Surgeon: Lesleigh Noe, MD;  Location: [facility] CATH LAB;  Service: Cardiovascular;  Laterality: N/A;  . MITRAL VALVE REPAIR  1/12   Dr. Cornelius Moras  . SHOULDER SURGERY  5/04   right  . TOE SURGERY    . TOTAL ABDOMINAL HYSTERECTOMY W/ BILATERAL SALPINGOOPHORECTOMY    . WRIST SURGERY     left 10/06, right 7/07    Current Medications: Current Meds  Medication Sig  . Ascorbic Acid (VITAMIN C PO) Take 1 tablet by mouth daily.  Marland Kitchen aspirin 81 MG tablet Take 81 mg by mouth daily.  . calcium carbonate 200 MG capsule Take 200 mg by mouth daily.   . ciprofloxacin (CIPRO) 250 MG tablet as directed per primary physcian  . metoprolol succinate (TOPROL-XL) 25 MG 24 hr tablet Take 1 tablet (25 mg total) by mouth daily.  . Multiple Vitamin (MULTIVITAMIN WITH MINERALS) TABS tablet Take 1 tablet by mouth daily.  . nitrofurantoin, macrocrystal-monohydrate, (MACROBID) 100 MG capsule Take 100 mg by mouth every other day.   . [DISCONTINUED] metoprolol succinate (TOPROL-XL) 25 MG 24 hr tablet Take 1 tablet (25 mg total) by mouth daily.     Allergies:   Codeine   Social History   Socioeconomic History  . Marital status: Single    Spouse name: Not on file  .  Number of children: Not on file  . Years of education: Not on file  . Highest education level: Not on file  Occupational History  . Not on file  Tobacco Use  . Smoking status: Never Smoker  . Smokeless tobacco: Never Used  Substance and Sexual Activity  . Alcohol use: No  . Drug use: No  . Sexual activity: Not Currently    Birth control/protection: Surgical    Comment: TAH/BSO  Other Topics Concern  . Not on file  Social History Narrative  . Not on file   Social Determinants of Health   Financial Resource Strain:   . Difficulty of Paying Living Expenses:   Food Insecurity:   . Worried About Programme researcher, broadcasting/film/video in the Last Year:   . Barista in the Last Year:   Transportation Needs:   . Freight forwarder (Medical):   Marland Kitchen Lack of Transportation  (Non-Medical):   Physical Activity:   . Days of Exercise per Week:   . Minutes of Exercise per Session:   Stress:   . Feeling of Stress :   Social Connections:   . Frequency of Communication with Friends and Family:   . Frequency of Social Gatherings with Friends and Family:   . Attends Religious Services:   . Active Member of Clubs or Organizations:   . Attends Banker Meetings:   Marland Kitchen Marital Status:      Family History: The patient's family history includes CVA in her father; Diabetes in her mother; Hiatal hernia in her father; Migraines in her mother.  ROS:   Please see the history of present illness.    No fevers chills nausea vomiting syncope.  All other systems reviewed and are negative.  EKGs/Labs/Other Studies Reviewed:    The following studies were reviewed today:  Cardiac catheterization-no CAD in 2015, Takotsubo cardiomyopathy, subsequent echocardiogram reviewed and resolved.  EKG:  EKG is  ordered today.  The ekg ordered today demonstrates sinus rhythm 82 with left anterior fascicular block, poor R wave progression  Recent Labs: 04/24/2019: ALT 26; BUN 22; Creatinine, Ser 0.88; Hemoglobin 15.1; Platelets 327; Potassium 5.5; Sodium 144  Recent Lipid Panel    Component Value Date/Time   CHOL 209 (H) 04/24/2019 1131   TRIG 155 (H) 04/24/2019 1131   HDL 58 04/24/2019 1131   CHOLHDL 3.6 04/24/2019 1131   CHOLHDL 3.7 04/19/2015 1341   VLDL 34 (H) 04/19/2015 1341   LDLCALC 124 (H) 04/24/2019 1131    Physical Exam:    VS:  BP 120/70   Pulse 82   Ht 5' (1.524 m)   Wt 105 lb 12.8 oz (48 kg)   LMP 01/02/1989   SpO2 98%   BMI 20.66 kg/m     Wt Readings from Last 3 Encounters:  05/16/19 105 lb 12.8 oz (48 kg)  04/24/19 105 lb (47.6 kg)  05/10/18 103 lb 8 oz (46.9 kg)     GEN:  Well nourished, well developed in no acute distress HEENT: Normal NECK: No JVD; No carotid bruits LYMPHATICS: No lymphadenopathy CARDIAC: RRR, no murmurs, rubs,  gallops RESPIRATORY:  Clear to auscultation without rales, wheezing or rhonchi  ABDOMEN: Soft, non-tender, non-distended MUSCULOSKELETAL:  No edema; No deformity  SKIN: Warm and dry NEUROLOGIC:  Alert and oriented x 3 PSYCHIATRIC:  Normal affect   ASSESSMENT:    1. History of mitral valve repair   2. Mixed hyperlipidemia   3. Stress-induced cardiomyopathy    PLAN:  In order of problems listed above:  Prior mitral valve repair -Performed in 2012.  Excellent result.  No symptoms.  Continue with dental antibiotics  Prior stress-induced cardiomyopathy -EF 15% preceded by migraine.  T waves were inverted noted on EKG blood pressure was soft at the time.  Continue with current medications.  Essential tremor -Per primary team.  Stable.  Hyperlipidemia -LDL 124, triglycerides 150 this was nonfasting.  Given the fact that she does not have any angiographically significant coronary artery disease, we will continue with diet and exercise.  Right bundle branch block/left anterior fascicular block/bifascicular block -No syncopal symptoms, no need for pacemaker at this time.  Continue with low-dose Toprol.   Medication Adjustments/Labs and Tests Ordered: Current medicines are reviewed at length with the patient today.  Concerns regarding medicines are outlined above.  No orders of the defined types were placed in this encounter.  Meds ordered this encounter  Medications  . metoprolol succinate (TOPROL-XL) 25 MG 24 hr tablet    Sig: Take 1 tablet (25 mg total) by mouth daily.    Dispense:  90 tablet    Refill:  3    Patient Instructions  Medication Instructions:   Your physician recommends that you continue on your current medications as directed. Please refer to the Current Medication list given to you today.  *If you need a refill on your cardiac medications before your next appointment, please call your pharmacy*   Follow-Up: At [facility], you and your health needs  are our priority.  As part of our continuing mission to provide you with exceptional heart care, we have created designated Provider Care Teams.  These Care Teams include your primary Cardiologist (physician) and Advanced Practice Providers (APPs -  Physician Assistants and Nurse Practitioners) who all work together to provide you with the care you need, when you need it.  We recommend signing up for the patient portal called "MyChart".  Sign up information is provided on this After Visit Summary.  MyChart is used to connect with patients for Virtual Visits (Telemedicine).  Patients are able to view lab/test results, encounter notes, upcoming appointments, etc.  Non-urgent messages can be sent to your provider as well.   To learn more about what you can do with MyChart, go to [email or web address].    Your next appointment:   12 month(s)  The format for your next appointment:   In Person  Provider:   Dr. Marlou Porch       Signed, Candee Furbish, MD  05/16/2019 9:35 AM    [place]

## 2019-05-16 NOTE — Addendum Note (Signed)
Addended by: Louanne Belton, CARLOS A on: 05/16/2019 05:03 PM   Modules accepted: Orders

## 2019-08-21 ENCOUNTER — Encounter: Payer: Self-pay | Admitting: Internal Medicine

## 2019-08-21 ENCOUNTER — Ambulatory Visit (INDEPENDENT_AMBULATORY_CARE_PROVIDER_SITE_OTHER): Payer: Medicare Other | Admitting: Internal Medicine

## 2019-08-21 ENCOUNTER — Other Ambulatory Visit: Payer: Self-pay

## 2019-08-21 VITALS — BP 120/78 | HR 97 | Temp 98.5°F | Ht 60.0 in | Wt 105.0 lb

## 2019-08-21 DIAGNOSIS — E7800 Pure hypercholesterolemia, unspecified: Secondary | ICD-10-CM | POA: Diagnosis not present

## 2019-08-21 DIAGNOSIS — G250 Essential tremor: Secondary | ICD-10-CM | POA: Diagnosis not present

## 2019-08-21 DIAGNOSIS — I5181 Takotsubo syndrome: Secondary | ICD-10-CM

## 2019-08-21 DIAGNOSIS — Z9889 Other specified postprocedural states: Secondary | ICD-10-CM | POA: Diagnosis not present

## 2019-08-21 NOTE — Progress Notes (Signed)
New Patient Office Visit     This visit occurred during the SARS-CoV-2 public health emergency.  Safety protocols were in place, including screening questions prior to the visit, additional usage of staff PPE, and extensive cleaning of exam room while observing appropriate contact time as indicated for disinfecting solutions.    CC/Reason for Visit: Establish care, discuss chronic conditions Previous PCP: None Last Visit: None  HPI: Helen Swanson is a 74 y.o. female who is coming in today for the above mentioned reasons. Past Medical History is significant for: Mitral valve repair in 2012 due to severe mitral valve regurgitation, history of Takotsubo cardiomyopathy that has recovered, a benign essential tremor.  She was diagnosed with hyperlipidemia this year with an LDL of 120 but she is not on medications.  She is followed by Dr. Donato Schultz with cardiology, Dr. Loreta Ave is her gastroenterologist, Dr. Hyacinth Meeker is her gynecologist who has been functioning as her primary care for many years.  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  Her vaccinations are up-to-date with the exception of shingles, she had a Cologuard in 2017 and then did fecal occult blood testing in 2020.  Her mammograms are up-to-date, her last bone density was in 2019.  She plans to continue to see Dr. Hyacinth Meeker.  She has no acute complaints today.   Past Medical/Surgical History: Past Medical History:  Diagnosis Date  . Emphysema of lung (HCC)    a. By CXR, nonsmoker.  Marland Kitchen Heart murmur   . Hyperlipidemia   . Hypotension   . IC (interstitial cystitis)   . Migraine    a. New onset 11/2013.  . Mitral regurgitation    a. s/p MV repair 2012.  Marland Kitchen MVP (mitral valve prolapse)    a. s/p MV repair 2012.  . Osteopenia   . Osteoporosis   . Pancreatitis    a. At time of MV repair.  Marland Kitchen PAT (paroxysmal atrial tachycardia) (HCC)    a. Brief during  11/2013 hospitalization.  . Pre-diabetes   . Shingles   . Tachycardia   . Takotsubo cardiomyopathy    a. 11/2013: Stress cardiomyopathy (NICM) - EF 25% by cath, 15% by echo, elevated troponin felt due to this. Normal coronaries by cath 11/25/13.  . Transaminitis   . Tremor     Past Surgical History:  Procedure Laterality Date  . DILATION AND CURETTAGE OF UTERUS     x2  . ELBOW SURGERY     right elbow for tendonitis  . LEFT HEART CATHETERIZATION WITH CORONARY ANGIOGRAM N/A 11/25/2013   Procedure: LEFT HEART CATHETERIZATION WITH CORONARY ANGIOGRAM;  Surgeon: Lesleigh Noe, MD;  Location: [facility] CATH LAB;  Service: Cardiovascular;  Laterality: N/A;  . MITRAL VALVE REPAIR  1/12   Dr. Cornelius Moras  . SHOULDER SURGERY  5/04   right  . TOE SURGERY    . TOTAL ABDOMINAL HYSTERECTOMY W/ BILATERAL SALPINGOOPHORECTOMY    . WRIST SURGERY     left 10/06, right 7/07    Social History:  reports that she has never smoked. She has never used smokeless tobacco. She reports that she does not drink alcohol and does not use drugs.  Allergies: Allergies  Allergen Reactions  . Codeine Nausea And Vomiting    NAUSEA    Family History:  Family History  Problem Relation Age of Onset  . Diabetes Mother   . Migraines Mother   .  CVA Father        mild MI  . Hiatal hernia Father      Current Outpatient Medications:  .  Ascorbic Acid (VITAMIN C PO), Take 1 tablet by mouth daily., Disp: , Rfl:  .  aspirin 81 MG tablet, Take 81 mg by mouth daily., Disp: , Rfl:  .  ciprofloxacin (CIPRO) 250 MG tablet, as directed per primary physcian, Disp: , Rfl: 3 .  metoprolol succinate (TOPROL-XL) 25 MG 24 hr tablet, Take 1 tablet (25 mg total) by mouth daily., Disp: 90 tablet, Rfl: 3 .  Multiple Vitamin (MULTIVITAMIN WITH MINERALS) TABS tablet, Take 1 tablet by mouth daily., Disp: , Rfl:  .  nitrofurantoin, macrocrystal-monohydrate, (MACROBID) 100 MG capsule, Take 100 mg by mouth every other day. , Disp: , Rfl:    Review of Systems:  Constitutional: Denies fever, chills, diaphoresis, appetite change and fatigue.  HEENT: Denies photophobia, eye pain, redness, hearing loss, ear pain, congestion, sore throat, rhinorrhea, sneezing, mouth sores, trouble swallowing, neck pain, neck stiffness and tinnitus.   Respiratory: Denies SOB, DOE, cough, chest tightness,  and wheezing.   Cardiovascular: Denies chest pain, palpitations and leg swelling.  Gastrointestinal: Denies nausea, vomiting, abdominal pain, diarrhea, constipation, blood in stool and abdominal distention.  Genitourinary: Denies dysuria, urgency, frequency, hematuria, flank pain and difficulty urinating.  Endocrine: Denies: hot or cold intolerance, sweats, changes in hair or nails, polyuria, polydipsia. Musculoskeletal: Denies myalgias, back pain, joint swelling, arthralgias and gait problem.  Skin: Denies pallor, rash and wound.  Neurological: Denies dizziness, seizures, syncope, weakness, light-headedness, numbness and headaches.  Hematological: Denies adenopathy. Easy bruising, personal or family bleeding history  Psychiatric/Behavioral: Denies suicidal ideation, mood changes, confusion, nervousness, sleep disturbance and agitation    Physical Exam: Vitals:   08/21/19 1020  BP: 120/78  Pulse: 97  Temp: 98.5 F (36.9 C)  TempSrc: Oral  SpO2: 99%  Weight: 105 lb (47.6 kg)  Height: 5' (1.524 m)   Body mass index is 20.51 kg/m.  Constitutional: NAD, calm, comfortable Eyes: PERRL, lids and conjunctivae normal ENMT: Mucous membranes are moist.  Respiratory: clear to auscultation bilaterally, no wheezing, no crackles. Normal respiratory effort. No accessory muscle use.  Cardiovascular: Regular rate and rhythm, no murmurs / rubs / gallops. No extremity edema. Neurologic: Grossly intact and nonfocal Psychiatric: Normal judgment and insight. Alert and oriented x 3. Normal mood.    Impression and Plan:  Benign head tremor -She barely  notices it and does not cause any issues for her, she has seen Dr. Everlena Cooper in the past.  Pure hypercholesterolemia -Manages with diet and exercise.  History of mitral valve repair Stress-induced cardiomyopathy -Followed by cardiology, has been stable.     Chaya Jan, MD [facility]

## 2019-09-15 DIAGNOSIS — Z23 Encounter for immunization: Secondary | ICD-10-CM | POA: Diagnosis not present

## 2019-12-18 DIAGNOSIS — N3020 Other chronic cystitis without hematuria: Secondary | ICD-10-CM | POA: Diagnosis not present

## 2019-12-18 DIAGNOSIS — R351 Nocturia: Secondary | ICD-10-CM | POA: Diagnosis not present

## 2020-01-09 DIAGNOSIS — Z20822 Contact with and (suspected) exposure to covid-19: Secondary | ICD-10-CM | POA: Diagnosis not present

## 2020-01-09 DIAGNOSIS — Z03818 Encounter for observation for suspected exposure to other biological agents ruled out: Secondary | ICD-10-CM | POA: Diagnosis not present

## 2020-02-09 ENCOUNTER — Other Ambulatory Visit: Payer: Self-pay | Admitting: Internal Medicine

## 2020-02-09 DIAGNOSIS — Z1231 Encounter for screening mammogram for malignant neoplasm of breast: Secondary | ICD-10-CM

## 2020-03-16 DIAGNOSIS — H5213 Myopia, bilateral: Secondary | ICD-10-CM | POA: Diagnosis not present

## 2020-03-26 ENCOUNTER — Ambulatory Visit
Admission: RE | Admit: 2020-03-26 | Discharge: 2020-03-26 | Disposition: A | Discharge status: Home / Self Care | Payer: Medicare Other | Source: Ambulatory Visit | Attending: Internal Medicine | Admitting: Internal Medicine

## 2020-03-26 ENCOUNTER — Other Ambulatory Visit: Payer: Self-pay

## 2020-03-26 DIAGNOSIS — Z1231 Encounter for screening mammogram for malignant neoplasm of breast: Secondary | ICD-10-CM

## 2020-05-12 ENCOUNTER — Other Ambulatory Visit: Payer: Self-pay

## 2020-05-12 ENCOUNTER — Encounter: Payer: Self-pay | Admitting: Cardiology

## 2020-05-12 ENCOUNTER — Ambulatory Visit: Payer: Medicare Other | Admitting: Cardiology

## 2020-05-12 VITALS — BP 120/72 | HR 81 | Ht 60.0 in | Wt 105.0 lb

## 2020-05-12 DIAGNOSIS — E782 Mixed hyperlipidemia: Secondary | ICD-10-CM

## 2020-05-12 DIAGNOSIS — Z9889 Other specified postprocedural states: Secondary | ICD-10-CM

## 2020-05-12 DIAGNOSIS — I340 Nonrheumatic mitral (valve) insufficiency: Secondary | ICD-10-CM | POA: Diagnosis not present

## 2020-05-12 NOTE — Progress Notes (Signed)
Cardiology Office Note:    Date:  05/12/2020   ID:  Helen Swanson, DOB 10/02/1945, MRN [id number]  PCP:  Philip Aspen, Limmie Patricia, MD   [facility] Providers Cardiologist:  Donato Schultz, MD     Referring MD: Philip Aspen, Estel*    History of Present Illness:    Helen Swanson is a 75 y.o. female here for follow-up of mitral valve repair from 2015 with prior Takotsubo like cardiomyopathy EF 15% that resolved.  Interestingly, a migraine preceded the event that caused the cardiomyopathy.  She enjoys traveling quite a bit, [employer] over 70 cruises.  She and her friend [name] enjoyed cruising the world.  She is also had paroxysmal atrial tachycardia during hospitalization in 2015 as well.  Battled tremors over the past few years.  Overall today she is feeling well no fevers chills nausea vomiting syncope bleeding.  Past Medical History:  Diagnosis Date  . Emphysema of lung (HCC)    a. By CXR, nonsmoker.  Marland Kitchen Heart murmur   . Hyperlipidemia   . Hypotension   . IC (interstitial cystitis)   . Migraine    a. New onset 11/2013.  . Mitral regurgitation    a. s/p MV repair 2012.  Marland Kitchen MVP (mitral valve prolapse)    a. s/p MV repair 2012.  . Osteopenia   . Osteoporosis   . Pancreatitis    a. At time of MV repair.  Marland Kitchen PAT (paroxysmal atrial tachycardia) (HCC)    a. Brief during 11/2013 hospitalization.  . Pre-diabetes   . Shingles   . Tachycardia   . Takotsubo cardiomyopathy    a. 11/2013: Stress cardiomyopathy (NICM) - EF 25% by cath, 15% by echo, elevated troponin felt due to this. Normal coronaries by cath 11/25/13.  . Transaminitis   . Tremor     Past Surgical History:  Procedure Laterality Date  . DILATION AND CURETTAGE OF UTERUS     x2  . ELBOW SURGERY     right elbow for tendonitis  . LEFT HEART CATHETERIZATION WITH CORONARY ANGIOGRAM N/A 11/25/2013   Procedure: LEFT HEART CATHETERIZATION WITH CORONARY ANGIOGRAM;  Surgeon: Lesleigh Noe, MD;  Location: [facility] CATH LAB;  Service: Cardiovascular;  Laterality: N/A;  . MITRAL VALVE REPAIR  1/12   Dr. Cornelius Moras  . SHOULDER SURGERY  5/04   right  . TOE SURGERY    . TOTAL ABDOMINAL HYSTERECTOMY W/ BILATERAL SALPINGOOPHORECTOMY    . WRIST SURGERY     left 10/06, right 7/07    Current Medications: Current Meds  Medication Sig  . Ascorbic Acid (VITAMIN C PO) Take 1 tablet by mouth daily.  Marland Kitchen aspirin 81 MG tablet Take 81 mg by mouth daily.  . ciprofloxacin (CIPRO) 250 MG tablet as directed per primary physcian  . metoprolol succinate (TOPROL-XL) 25 MG 24 hr tablet Take 1 tablet (25 mg total) by mouth daily.  . Multiple Vitamin (MULTIVITAMIN WITH MINERALS) TABS tablet Take 1 tablet by mouth daily.  . nitrofurantoin, macrocrystal-monohydrate, (MACROBID) 100 MG capsule Take 100 mg by mouth every other day.      Allergies:   Codeine   Social History   Socioeconomic History  . Marital status: Single    Spouse name: Not on file  . Number of children: Not on file  . Years of education: Not on file  . Highest education level: Not on file  Occupational History  . Not on file  Tobacco Use  . Smoking status:  Never Smoker  . Smokeless tobacco: Never Used  Vaping Use  . Vaping Use: Never used  Substance and Sexual Activity  . Alcohol use: No  . Drug use: No  . Sexual activity: Not Currently    Birth control/protection: Surgical    Comment: TAH/BSO  Other Topics Concern  . Not on file  Social History Narrative  . Not on file   Social Determinants of Health   Financial Resource Strain: Not on file  Food Insecurity: Not on file  Transportation Needs: Not on file  Physical Activity: Not on file  Stress: Not on file  Social Connections: Not on file     Family History: The patient's family history includes CVA in her father; Diabetes in her mother; Hiatal hernia in her father; Migraines in her mother.  ROS:   Please see the history of present illness.     All other  systems reviewed and are negative.  EKGs/Labs/Other Studies Reviewed:      EKG:  EKG is  ordered today.  The ekg ordered today demonstrates sinus rhythm right bundle branch block, left anterior fascicular block  Recent Labs: No results found for requested labs within last 8760 hours.  Recent Lipid Panel    Component Value Date/Time   CHOL 209 (H) 04/24/2019 1131   TRIG 155 (H) 04/24/2019 1131   HDL 58 04/24/2019 1131   CHOLHDL 3.6 04/24/2019 1131   CHOLHDL 3.7 04/19/2015 1341   VLDL 34 (H) 04/19/2015 1341   LDLCALC 124 (H) 04/24/2019 1131     Risk Assessment/Calculations:      Physical Exam:    VS:  BP 120/72 (BP Location: Right Arm, Patient Position: Sitting, Cuff Size: Normal)   Pulse 81   Ht 5' (1.524 m)   Wt 105 lb (47.6 kg)   LMP 01/02/1989   BMI 20.51 kg/m     Wt Readings from Last 3 Encounters:  05/12/20 105 lb (47.6 kg)  08/21/19 105 lb (47.6 kg)  05/16/19 105 lb 12.8 oz (48 kg)     GEN:  Well nourished, well developed in no acute distress HEENT: Normal NECK: No JVD; No carotid bruits LYMPHATICS: No lymphadenopathy CARDIAC: RRR, no murmurs, rubs, gallops RESPIRATORY:  Clear to auscultation without rales, wheezing or rhonchi  ABDOMEN: Soft, non-tender, non-distended MUSCULOSKELETAL:  No edema; No deformity  SKIN: Warm and dry NEUROLOGIC:  Alert and oriented x 3 PSYCHIATRIC:  Normal affect   ASSESSMENT:    1. Nonrheumatic mitral valve regurgitation   2. History of mitral valve repair   3. Mixed hyperlipidemia    PLAN:    In order of problems listed above:  Mitral valve repair - 2015 secondary to severe mitral regurgitation.  Doing well.  No symptoms currently.  Continue with dental antibiotics.  Echocardiogram from 2015 reviewed.  Since it has been 7 years, we will go ahead and check echocardiogram. Continue with aspirin and metoprolol.  Refilling metoprolol.  Previous stress-induced cardiomyopathy - Interestingly this was preceded by  migraine headache.  T wave inversions were noted on ECG.  Classic Takotsubo.  Resolution of EF.  Continue with current medication strategy.  No changes made in medical management.  Essential tremor - Primary team has been monitoring.  Stable.  Hyperlipidemia - Prior cardiac catheterization showed no evidence of CAD.  Continue with diet and exercise.  Her prior LDL was 124.  Right bundle branch block/left anterior fascicular block, bifascicular block - Continue to monitor for any syncopal symptoms.  She is on  low-dose Toprol.  Continue with his current medical management.    Medication Adjustments/Labs and Tests Ordered: Current medicines are reviewed at length with the patient today.  Concerns regarding medicines are outlined above.  Orders Placed This Encounter  Procedures  . EKG 12-Lead  . ECHOCARDIOGRAM COMPLETE   No orders of the defined types were placed in this encounter.   Patient Instructions  Medication Instructions:  Your physician recommends that you continue on your current medications as directed. Please refer to the Current Medication list given to you today.  *If you need a refill on your cardiac medications before your next appointment, please call your pharmacy*   Lab Work: none If you have labs (blood work) drawn today and your tests are completely normal, you will receive your results only by: Marland Kitchen MyChart Message (if you have MyChart) OR . A paper copy in the mail If you have any lab test that is abnormal or we need to change your treatment, we will call you to review the results.   Testing/Procedures: Your physician has requested that you have an echocardiogram. Echocardiography is a painless test that uses sound waves to create images of your heart. It provides your doctor with information about the size and shape of your heart and how well your heart's chambers and valves are working. This procedure takes approximately one hour. There are no restrictions for  this procedure.     Follow-Up: At [facility], you and your health needs are our priority.  As part of our continuing mission to provide you with exceptional heart care, we have created designated Provider Care Teams.  These Care Teams include your primary Cardiologist (physician) and Advanced Practice Providers (APPs -  Physician Assistants and Nurse Practitioners) who all work together to provide you with the care you need, when you need it.  We recommend signing up for the patient portal called "MyChart".  Sign up information is provided on this After Visit Summary.  MyChart is used to connect with patients for Virtual Visits (Telemedicine).  Patients are able to view lab/test results, encounter notes, upcoming appointments, etc.  Non-urgent messages can be sent to your provider as well.   To learn more about what you can do with MyChart, go to [email or web address].    Your next appointment:   1 year(s)  The format for your next appointment:   In Person  Provider:   Donato Schultz, MD   Other Instructions      Signed, Donato Schultz, MD  05/12/2020 9:24 AM    [facility]

## 2020-05-12 NOTE — Patient Instructions (Signed)
Medication Instructions:  Your physician recommends that you continue on your current medications as directed. Please refer to the Current Medication list given to you today.  *If you need a refill on your cardiac medications before your next appointment, please call your pharmacy*   Lab Work: none If you have labs (blood work) drawn today and your tests are completely normal, you will receive your results only by: . MyChart Message (if you have MyChart) OR . A paper copy in the mail If you have any lab test that is abnormal or we need to change your treatment, we will call you to review the results.   Testing/Procedures: Your physician has requested that you have an echocardiogram. Echocardiography is a painless test that uses sound waves to create images of your heart. It provides your doctor with information about the size and shape of your heart and how well your heart's chambers and valves are working. This procedure takes approximately one hour. There are no restrictions for this procedure.     Follow-Up: At [facility], you and your health needs are our priority.  As part of our continuing mission to provide you with exceptional heart care, we have created designated Provider Care Teams.  These Care Teams include your primary Cardiologist (physician) and Advanced Practice Providers (APPs -  Physician Assistants and Nurse Practitioners) who all work together to provide you with the care you need, when you need it.  We recommend signing up for the patient portal called "MyChart".  Sign up information is provided on this After Visit Summary.  MyChart is used to connect with patients for Virtual Visits (Telemedicine).  Patients are able to view lab/test results, encounter notes, upcoming appointments, etc.  Non-urgent messages can be sent to your provider as well.   To learn more about what you can do with MyChart, go to [email or web address].    Your next appointment:   1  year(s)  The format for your next appointment:   In Person  Provider:   Mark Skains, MD   Other Instructions   

## 2020-05-13 ENCOUNTER — Other Ambulatory Visit: Payer: Self-pay

## 2020-05-13 MED ORDER — METOPROLOL SUCCINATE ER 25 MG PO TB24: 25.0000 mg | ORAL_TABLET | Freq: Every day | ORAL | 3 refills | Status: DC

## 2020-05-13 NOTE — Telephone Encounter (Signed)
Pt's medication was sent to pt's pharmacy as requested. Confirmation received.  °

## 2020-05-14 ENCOUNTER — Ambulatory Visit: Payer: Medicare Other | Admitting: Cardiology

## 2020-06-11 ENCOUNTER — Other Ambulatory Visit: Payer: Self-pay

## 2020-06-15 ENCOUNTER — Other Ambulatory Visit: Payer: Self-pay

## 2020-06-15 ENCOUNTER — Ambulatory Visit (HOSPITAL_COMMUNITY): Payer: Medicare Other | Attending: Internal Medicine

## 2020-06-15 DIAGNOSIS — E782 Mixed hyperlipidemia: Secondary | ICD-10-CM | POA: Insufficient documentation

## 2020-06-15 DIAGNOSIS — I340 Nonrheumatic mitral (valve) insufficiency: Secondary | ICD-10-CM | POA: Diagnosis not present

## 2020-06-15 DIAGNOSIS — Z9889 Other specified postprocedural states: Secondary | ICD-10-CM | POA: Diagnosis not present

## 2020-06-15 LAB — ECHOCARDIOGRAM COMPLETE
Area-P 1/2: 2.08 cm2
S' Lateral: 1.9 cm

## 2020-07-02 ENCOUNTER — Ambulatory Visit: Payer: Medicare Other

## 2020-08-18 ENCOUNTER — Other Ambulatory Visit: Payer: Self-pay

## 2020-08-19 ENCOUNTER — Telehealth: Payer: Self-pay | Admitting: Internal Medicine

## 2020-08-19 ENCOUNTER — Encounter: Payer: Medicare Other | Admitting: Internal Medicine

## 2020-08-19 NOTE — Telephone Encounter (Signed)
Patient has a virtual visit scheduled 08/20/20

## 2020-08-19 NOTE — Telephone Encounter (Signed)
Patient tested positive for Covid this morning, however her symptoms started last night night.  Fever is 99.8, however this morning it is 98.6 with Tylenol.  Other symptoms is scratchy throat, some coughing, clogged nose, headache, and 1 time of diarrhea this morning.  Tolerating food well.  Patient is wondering if she needs an antiviral.  Please give patient a call back.

## 2020-08-20 ENCOUNTER — Telehealth (INDEPENDENT_AMBULATORY_CARE_PROVIDER_SITE_OTHER): Payer: Medicare Other | Admitting: Internal Medicine

## 2020-08-20 ENCOUNTER — Encounter: Payer: Self-pay | Admitting: Internal Medicine

## 2020-08-20 VITALS — Temp 98.0°F

## 2020-08-20 DIAGNOSIS — U071 COVID-19: Secondary | ICD-10-CM

## 2020-08-20 MED ORDER — MOLNUPIRAVIR EUA 200MG CAPSULE: 4.0000 | ORAL_CAPSULE | Freq: Two times a day (BID) | ORAL | 0 refills | Status: AC

## 2020-08-20 NOTE — Progress Notes (Signed)
Virtual Visit via Video Note  I connected with Marcina Millard on 08/20/20 at  2:00 PM EDT by a video enabled telemedicine application and verified that I am speaking with the correct person using two identifiers.  Location patient: home Location provider: work office Persons participating in the virtual visit: patient, provider  I discussed the limitations of evaluation and management by telemedicine and the availability of in person appointments. The patient expressed understanding and agreed to proceed.   HPI: She has called to inform me that she tested positive for COVID.  She is vaccinated and fully boosted.  Her symptoms started on Wednesday with a sore throat and chills, her temperature was 100.3.  The next day she had a cough and a decreased appetite as well as a pretty significant headache.  That day she took a COVID test which was positive.  She has been taking Tylenol which has helped headache and fever.  T-max today is 98.   ROS: Constitutional: Positive for fever, chills, diaphoresis, appetite change and fatigue.  HEENT: Denies photophobia, eye pain, redness,  mouth sores, trouble swallowing, neck pain, neck stiffness and tinnitus.   Respiratory: Denies SOB, DOE, chest tightness,  and wheezing.   Cardiovascular: Denies chest pain, palpitations and leg swelling.  Gastrointestinal: Denies nausea, vomiting, abdominal pain, diarrhea, constipation, blood in stool and abdominal distention.  Genitourinary: Denies dysuria, urgency, frequency, hematuria, flank pain and difficulty urinating.  Endocrine: Denies: hot or cold intolerance, sweats, changes in hair or nails, polyuria, polydipsia. Musculoskeletal: Denies myalgias, back pain, joint swelling, arthralgias and gait problem.  Skin: Denies pallor, rash and wound.  Neurological: Denies dizziness, seizures, syncope, weakness, light-headedness, numbness and headaches.  Hematological: Denies adenopathy. Easy bruising, personal  or family bleeding history  Psychiatric/Behavioral: Denies suicidal ideation, mood changes, confusion, nervousness, sleep disturbance and agitation   Past Medical History:  Diagnosis Date   Emphysema of lung (HCC)    a. By CXR, nonsmoker.   Heart murmur    Hyperlipidemia    Hypotension    IC (interstitial cystitis)    Migraine    a. New onset 11/2013.   Mitral regurgitation    a. s/p MV repair 2012.   MVP (mitral valve prolapse)    a. s/p MV repair 2012.   Osteopenia    Osteoporosis    Pancreatitis    a. At time of MV repair.   PAT (paroxysmal atrial tachycardia) (HCC)    a. Brief during 11/2013 hospitalization.   Pre-diabetes    Shingles    Tachycardia    Takotsubo cardiomyopathy    a. 11/2013: Stress cardiomyopathy (NICM) - EF 25% by cath, 15% by echo, elevated troponin felt due to this. Normal coronaries by cath 11/25/13.   Transaminitis    Tremor     Past Surgical History:  Procedure Laterality Date   DILATION AND CURETTAGE OF UTERUS     x2   ELBOW SURGERY     right elbow for tendonitis   LEFT HEART CATHETERIZATION WITH CORONARY ANGIOGRAM N/A 11/25/2013   Procedure: LEFT HEART CATHETERIZATION WITH CORONARY ANGIOGRAM;  Surgeon: Lesleigh Noe, MD;  Location: [facility] CATH LAB;  Service: Cardiovascular;  Laterality: N/A;   MITRAL VALVE REPAIR  1/12   Dr. Cornelius Moras   SHOULDER SURGERY  5/04   right   TOE SURGERY     TOTAL ABDOMINAL HYSTERECTOMY W/ BILATERAL SALPINGOOPHORECTOMY     WRIST SURGERY     left 10/06, right 7/07    Family  History  Problem Relation Age of Onset   Diabetes Mother    Migraines Mother    CVA Father        mild MI   Hiatal hernia Father     SOCIAL HX:   reports that she has never smoked. She has never used smokeless tobacco. She reports that she does not drink alcohol and does not use drugs.   Current Outpatient Medications:    Ascorbic Acid (VITAMIN C PO), Take 1 tablet by mouth daily., Disp: , Rfl:    aspirin 81 MG tablet, Take 81 mg by  mouth daily., Disp: , Rfl:    ciprofloxacin (CIPRO) 250 MG tablet, as directed per primary physcian, Disp: , Rfl: 3   metoprolol succinate (TOPROL-XL) 25 MG 24 hr tablet, Take 1 tablet (25 mg total) by mouth daily., Disp: 90 tablet, Rfl: 3   molnupiravir EUA 200 mg CAPS, Take 4 capsules (800 mg total) by mouth 2 (two) times daily for 5 days., Disp: 40 capsule, Rfl: 0   Multiple Vitamin (MULTIVITAMIN WITH MINERALS) TABS tablet, Take 1 tablet by mouth daily., Disp: , Rfl:    nitrofurantoin, macrocrystal-monohydrate, (MACROBID) 100 MG capsule, Take 100 mg by mouth every other day. , Disp: , Rfl:   EXAM:   VITALS per patient if applicable: T-max today of 98.0  GENERAL: alert, oriented, appears well and in no acute distress  HEENT: atraumatic, conjunttiva clear, no obvious abnormalities on inspection of external nose and ears  NECK: normal movements of the head and neck  LUNGS: on inspection no signs of respiratory distress, breathing rate appears normal, no obvious gross increased work of breathing, gasping or wheezing  CV: no obvious cyanosis  MS: moves all visible extremities without noticeable abnormality  PSYCH/NEURO: pleasant and cooperative, no obvious depression or anxiety, speech and thought processing grossly intact  ASSESSMENT AND PLAN:   COVID-19  - Plan: molnupiravir EUA 200 mg CAPS -She may also take OTC medications such as pain relievers, antihistamines, guaifenesin, decongestants. -We have discussed severe symptoms that would prompt weekend emergency department visit. -Otherwise she knows to reach out to me if she fails to improve.     I discussed the assessment and treatment plan with the patient. The patient was provided an opportunity to ask questions and all were answered. The patient agreed with the plan and demonstrated an understanding of the instructions.   The patient was advised to call back or seek an in-person evaluation if the symptoms worsen or if the  condition fails to improve as anticipated.    Chaya Jan, MD  [facility]

## 2020-09-28 DIAGNOSIS — Z23 Encounter for immunization: Secondary | ICD-10-CM | POA: Diagnosis not present

## 2020-10-29 ENCOUNTER — Ambulatory Visit (INDEPENDENT_AMBULATORY_CARE_PROVIDER_SITE_OTHER): Payer: Medicare Other | Admitting: Internal Medicine

## 2020-10-29 ENCOUNTER — Other Ambulatory Visit: Payer: Self-pay

## 2020-10-29 ENCOUNTER — Encounter: Payer: Self-pay | Admitting: Internal Medicine

## 2020-10-29 VITALS — BP 120/80 | HR 90 | Temp 97.6°F | Ht 61.0 in | Wt 105.4 lb

## 2020-10-29 DIAGNOSIS — Z13820 Encounter for screening for osteoporosis: Secondary | ICD-10-CM

## 2020-10-29 DIAGNOSIS — Z9889 Other specified postprocedural states: Secondary | ICD-10-CM

## 2020-10-29 DIAGNOSIS — Z0000 Encounter for general adult medical examination without abnormal findings: Secondary | ICD-10-CM | POA: Diagnosis not present

## 2020-10-29 DIAGNOSIS — Z1211 Encounter for screening for malignant neoplasm of colon: Secondary | ICD-10-CM | POA: Diagnosis not present

## 2020-10-29 DIAGNOSIS — Z780 Asymptomatic menopausal state: Secondary | ICD-10-CM

## 2020-10-29 DIAGNOSIS — E782 Mixed hyperlipidemia: Secondary | ICD-10-CM | POA: Diagnosis not present

## 2020-10-29 LAB — TSH: TSH: 4.63 u[IU]/mL (ref 0.35–5.50)

## 2020-10-29 LAB — CBC WITH DIFFERENTIAL/PLATELET
Basophils Absolute: 0.1 10*3/uL (ref 0.0–0.1)
Basophils Relative: 1.2 % (ref 0.0–3.0)
Eosinophils Absolute: 0.3 10*3/uL (ref 0.0–0.7)
Eosinophils Relative: 5.8 % — ABNORMAL HIGH (ref 0.0–5.0)
HCT: 45.3 % (ref 36.0–46.0)
Hemoglobin: 15 g/dL (ref 12.0–15.0)
Lymphocytes Relative: 15.9 % (ref 12.0–46.0)
Lymphs Abs: 0.8 10*3/uL (ref 0.7–4.0)
MCHC: 33.1 g/dL (ref 30.0–36.0)
MCV: 89.2 fl (ref 78.0–100.0)
Monocytes Absolute: 0.4 10*3/uL (ref 0.1–1.0)
Monocytes Relative: 8.8 % (ref 3.0–12.0)
Neutro Abs: 3.4 10*3/uL (ref 1.4–7.7)
Neutrophils Relative %: 68.3 % (ref 43.0–77.0)
Platelets: 249 10*3/uL (ref 150.0–400.0)
RBC: 5.08 Mil/uL (ref 3.87–5.11)
RDW: 14.3 % (ref 11.5–15.5)
WBC: 4.9 10*3/uL (ref 4.0–10.5)

## 2020-10-29 LAB — LDL CHOLESTEROL, DIRECT: Direct LDL: 105 mg/dL

## 2020-10-29 LAB — COMPREHENSIVE METABOLIC PANEL
ALT: 34 U/L (ref 0–35)
AST: 30 U/L (ref 0–37)
Albumin: 4.9 g/dL (ref 3.5–5.2)
Alkaline Phosphatase: 86 U/L (ref 39–117)
BUN: 22 mg/dL (ref 6–23)
CO2: 21 mEq/L (ref 19–32)
Calcium: 10.3 mg/dL (ref 8.4–10.5)
Chloride: 107 mEq/L (ref 96–112)
Creatinine, Ser: 0.98 mg/dL (ref 0.40–1.20)
GFR: 57.5 mL/min — ABNORMAL LOW (ref >60.00)
Glucose, Bld: 86 mg/dL (ref 70–99)
Potassium: 4 mEq/L (ref 3.5–5.1)
Sodium: 143 mEq/L (ref 135–145)
Total Bilirubin: 0.6 mg/dL (ref 0.2–1.2)
Total Protein: 8 g/dL (ref 6.0–8.3)

## 2020-10-29 LAB — LIPID PANEL
Cholesterol: 220 mg/dL — ABNORMAL HIGH (ref 0–200)
HDL: 57.8 mg/dL (ref >39.00)
NonHDL: 161.95
Total CHOL/HDL Ratio: 4
Triglycerides: 265 mg/dL — ABNORMAL HIGH (ref 0.0–149.0)
VLDL: 53 mg/dL — ABNORMAL HIGH (ref 0.0–40.0)

## 2020-10-29 LAB — VITAMIN B12: Vitamin B-12: 929 pg/mL — ABNORMAL HIGH (ref 211–911)

## 2020-10-29 LAB — VITAMIN D 25 HYDROXY (VIT D DEFICIENCY, FRACTURES): VITD: 59.12 ng/mL (ref 30.00–100.00)

## 2020-10-29 LAB — HEMOGLOBIN A1C: Hgb A1c MFr Bld: 5.5 % (ref 4.6–6.5)

## 2020-10-29 NOTE — Progress Notes (Signed)
Established Patient Office Visit     This visit occurred during the SARS-CoV-2 public health emergency.  Safety protocols were in place, including screening questions prior to the visit, additional usage of staff PPE, and extensive cleaning of exam room while observing appropriate contact time as indicated for disinfecting solutions.    CC/Reason for Visit: Annual preventive exam and subsequent Medicare wellness visit  HPI: Helen Swanson is a 75 y.o. female who is coming in today for the above mentioned reasons. Past Medical History is significant for: Hyperlipidemia and a benign essential tremor.  She has been doing well and has no acute concerns.  She is an avid cruiser and just returned from a 2-week cruise in [place].  She has routine eye and dental care, no perceived hearing issues, she exercises routinely.  All immunizations are up-to-date with the exception of the most recent COVID booster because she had COVID infection in mid August.  She is also due shingles vaccine.  She is overdue for a DEXA scan, she is requesting a Cologuard, she had a mammogram in March.   Past Medical/Surgical History: Past Medical History:  Diagnosis Date   Emphysema of lung (HCC)    a. By CXR, nonsmoker.   Heart murmur    Hyperlipidemia    Hypotension    IC (interstitial cystitis)    Migraine    a. New onset 11/2013.   Mitral regurgitation    a. s/p MV repair 2012.   MVP (mitral valve prolapse)    a. s/p MV repair 2012.   Osteopenia    Osteoporosis    Pancreatitis    a. At time of MV repair.   PAT (paroxysmal atrial tachycardia) (HCC)    a. Brief during 11/2013 hospitalization.   Pre-diabetes    Shingles    Tachycardia    Takotsubo cardiomyopathy    a. 11/2013: Stress cardiomyopathy (NICM) - EF 25% by cath, 15% by echo, elevated troponin felt due to this. Normal coronaries by cath 11/25/13.   Transaminitis    Tremor     Past Surgical History:  Procedure Laterality Date    DILATION AND CURETTAGE OF UTERUS     x2   ELBOW SURGERY     right elbow for tendonitis   LEFT HEART CATHETERIZATION WITH CORONARY ANGIOGRAM N/A 11/25/2013   Procedure: LEFT HEART CATHETERIZATION WITH CORONARY ANGIOGRAM;  Surgeon: Lesleigh Noe, MD;  Location[facility] CATH LAB;  Service: Cardiovascular;  Laterality: N/A;   MITRAL VALVE REPAIR  1/12   Dr. Cornelius Moras   SHOULDER SURGERY  5/04   right   TOE SURGERY     TOTAL ABDOMINAL HYSTERECTOMY W/ BILATERAL SALPINGOOPHORECTOMY     WRIST SURGERY     left 10/06, right 7/07    Social History:  reports that she has never smoked. She has never used smokeless tobacco. She reports that she does not drink alcohol and does not use drugs.  Allergies: Allergies  Allergen Reactions   Codeine Nausea And Vomiting    NAUSEA    Family History:  Family History  Problem Relation Age of Onset   Diabetes Mother    Migraines Mother    CVA Father        mild MI   Hiatal hernia Father      Current Outpatient Medications:    Ascorbic Acid (VITAMIN C PO), Take 1 tablet by mouth daily., Disp: , Rfl:    aspirin 81 MG tablet, Take 81 mg by mouth  daily., Disp: , Rfl:    ciprofloxacin (CIPRO) 250 MG tablet, as directed per primary physcian, Disp: , Rfl: 3   metoprolol succinate (TOPROL-XL) 25 MG 24 hr tablet, Take 1 tablet (25 mg total) by mouth daily., Disp: 90 tablet, Rfl: 3   Multiple Vitamin (MULTIVITAMIN WITH MINERALS) TABS tablet, Take 1 tablet by mouth daily., Disp: , Rfl:    nitrofurantoin, macrocrystal-monohydrate, (MACROBID) 100 MG capsule, Take 100 mg by mouth every other day. , Disp: , Rfl:   Review of Systems:  Constitutional: Denies fever, chills, diaphoresis, appetite change and fatigue.  HEENT: Denies photophobia, eye pain, redness, hearing loss, ear pain, congestion, sore throat, rhinorrhea, sneezing, mouth sores, trouble swallowing, neck pain, neck stiffness and tinnitus.   Respiratory: Denies SOB, DOE, cough, chest tightness,  and  wheezing.   Cardiovascular: Denies chest pain, palpitations and leg swelling.  Gastrointestinal: Denies nausea, vomiting, abdominal pain, diarrhea, constipation, blood in stool and abdominal distention.  Genitourinary: Denies dysuria, urgency, frequency, hematuria, flank pain and difficulty urinating.  Endocrine: Denies: hot or cold intolerance, sweats, changes in hair or nails, polyuria, polydipsia. Musculoskeletal: Denies myalgias, back pain, joint swelling, arthralgias and gait problem.  Skin: Denies pallor, rash and wound.  Neurological: Denies dizziness, seizures, syncope, weakness, light-headedness, numbness and headaches.  Hematological: Denies adenopathy. Easy bruising, personal or family bleeding history  Psychiatric/Behavioral: Denies suicidal ideation, mood changes, confusion, nervousness, sleep disturbance and agitation    Physical Exam: Vitals:   10/29/20 0753  BP: 120/80  Pulse: 90  Temp: 97.6 F (36.4 C)  TempSrc: Oral  SpO2: 97%  Weight: 105 lb 6.4 oz (47.8 kg)  Height: 5\' 1"  (1.549 m)    Body mass index is 19.92 kg/m.   Constitutional: NAD, calm, comfortable, head tremor noted Eyes: PERRL, lids and conjunctivae normal ENMT: Mucous membranes are moist. Posterior pharynx clear of any exudate or lesions. Normal dentition. Tympanic membrane is pearly white, no erythema or bulging. Neck: normal, supple, no masses, no thyromegaly Respiratory: clear to auscultation bilaterally, no wheezing, no crackles. Normal respiratory effort. No accessory muscle use.  Cardiovascular: Regular rate and rhythm, no murmurs / rubs / gallops. No extremity edema. 2+ pedal pulses. No carotid bruits.  Abdomen: no tenderness, no masses palpated. No hepatosplenomegaly. Bowel sounds positive.  Musculoskeletal: no clubbing / cyanosis. No joint deformity upper and lower extremities. Good ROM, no contractures. Normal muscle tone.  Skin: no rashes, lesions, ulcers. No induration Neurologic: CN  2-12 grossly intact. Sensation intact, DTR normal. Strength 5/5 in all 4.  Psychiatric: Normal judgment and insight. Alert and oriented x 3. Normal mood.    Subsequent Medicare wellness visit   1. Risk factors, based on past  M,S,F -cardiovascular disease risk factors include age and hyperlipidemia only   2.  Physical activities: Very physically active   3.  Depression/mood: Stable, not depressed   4.  Hearing: No perceived issues   5.  ADL's: Independent in all ADLs   6.  Fall risk: Low fall risk   7.  Home safety: No problems identified   8.  Height weight, and visual acuity: height and weight as above, vision:  Vision Screening   Right eye Left eye Both eyes  Without correction attempted attempted attempted  With correction        9.  Counseling: Advised update vaccination status   10. Lab orders based on risk factors: Laboratory update will be reviewed   11. Referral : None today   12. Care plan: Follow-up with  me in 12 months   13. Cognitive assessment: No cognitive impairment   14. Screening: Patient provided with a written and personalized 5-10 year screening schedule in the AVS. yes   15. Provider List Update: PCP, cardiology  16. Advance Directives: Full code   17. Opioids: Patient is not on any opioid prescriptions and has no risk factors for a substance use disorder.   Flowsheet Row Office Visit from 10/29/2020 in [facility] [place]  PHQ-9 Total Score 1       Fall Risk 08/21/2019 10/29/2020  Falls in the past year? 0 0  Was there an injury with Fall? 0 0  Fall Risk Category Calculator 0 0  Fall Risk Category Low Low     Impression and Plan:  Encounter for preventive health examination -Recommend routine eye and dental care. -Immunizations: She will get COVID booster and shingles vaccine at pharmacy. -Healthy lifestyle discussed in detail. -Labs to be updated today. -Colon cancer screening: Cologuard ordered per  request -Breast cancer screening: March 2022 -Cervical cancer screening: Prefers to defer -Lung cancer screening: Not applicable -Prostate cancer screening: Not applicable -DEXA: Last in March 2019, ordered today   Screening for malignant neoplasm of colon  - Plan: Cologuard  Encounter for osteoporosis screening in asymptomatic postmenopausal patient  - Plan: DG Bone Density  Mixed hyperlipidemia  -Check lipids today, not on statin therapy.  History of mitral valve repair -Follows with cardiology and had a recent echocardiogram.    Patient Instructions  -Nice seeing you today!!  -Lab work today; will notify you once results are available.  -Remember your COVID booster and shingles vaccines.  -Schedule follow up in 1 year or sooner as needed.     Chaya Jan, MD [facility]

## 2020-10-29 NOTE — Patient Instructions (Signed)
-  Nice seeing you today!!  -Lab work today; will notify you once results are available.  -Remember your COVID booster and shingles vaccines.  -Schedule follow up in 1 year or sooner as needed.

## 2020-11-04 DIAGNOSIS — Z1211 Encounter for screening for malignant neoplasm of colon: Secondary | ICD-10-CM | POA: Diagnosis not present

## 2020-11-05 ENCOUNTER — Other Ambulatory Visit: Payer: Self-pay | Admitting: Internal Medicine

## 2020-11-05 DIAGNOSIS — Z1231 Encounter for screening mammogram for malignant neoplasm of breast: Secondary | ICD-10-CM

## 2020-11-11 LAB — COLOGUARD: COLOGUARD: NEGATIVE

## 2020-12-16 DIAGNOSIS — N3020 Other chronic cystitis without hematuria: Secondary | ICD-10-CM | POA: Diagnosis not present

## 2020-12-16 DIAGNOSIS — R350 Frequency of micturition: Secondary | ICD-10-CM | POA: Diagnosis not present

## 2021-04-11 ENCOUNTER — Ambulatory Visit
Admission: RE | Admit: 2021-04-11 | Discharge: 2021-04-11 | Disposition: A | Discharge status: Home / Self Care | Payer: Medicare Other | Source: Ambulatory Visit | Attending: Internal Medicine | Admitting: Internal Medicine

## 2021-04-11 DIAGNOSIS — Z780 Asymptomatic menopausal state: Secondary | ICD-10-CM | POA: Diagnosis not present

## 2021-04-11 DIAGNOSIS — Z1231 Encounter for screening mammogram for malignant neoplasm of breast: Secondary | ICD-10-CM | POA: Diagnosis not present

## 2021-04-11 DIAGNOSIS — M85852 Other specified disorders of bone density and structure, left thigh: Secondary | ICD-10-CM | POA: Diagnosis not present

## 2021-04-11 DIAGNOSIS — M810 Age-related osteoporosis without current pathological fracture: Secondary | ICD-10-CM | POA: Diagnosis not present

## 2021-04-11 DIAGNOSIS — Z13820 Encounter for screening for osteoporosis: Secondary | ICD-10-CM

## 2021-04-19 ENCOUNTER — Encounter: Payer: Self-pay | Admitting: Internal Medicine

## 2021-05-17 ENCOUNTER — Encounter: Payer: Self-pay | Admitting: Cardiology

## 2021-05-17 ENCOUNTER — Ambulatory Visit: Payer: Medicare Other | Admitting: Cardiology

## 2021-05-17 DIAGNOSIS — Z9889 Other specified postprocedural states: Secondary | ICD-10-CM

## 2021-05-17 DIAGNOSIS — I5181 Takotsubo syndrome: Secondary | ICD-10-CM

## 2021-05-17 DIAGNOSIS — I4510 Unspecified right bundle-branch block: Secondary | ICD-10-CM

## 2021-05-17 DIAGNOSIS — E782 Mixed hyperlipidemia: Secondary | ICD-10-CM | POA: Diagnosis not present

## 2021-05-17 MED ORDER — METOPROLOL SUCCINATE ER 25 MG PO TB24: 25.0000 mg | ORAL_TABLET | Freq: Every day | ORAL | 3 refills | Status: DC

## 2021-05-17 NOTE — Assessment & Plan Note (Signed)
Repaired in 2015 secondary to severe mitral regurgitation.  Echocardiogram 2022 reassuring with only mild MR.  Doing very well.  Aspirin, metoprolol.  Refills.  No change in medication management.  Understands dental antibiotics.  No shortness of breath no chest pain.  Wonderful. ?

## 2021-05-17 NOTE — Assessment & Plan Note (Signed)
Had a prior episode of stress-induced cardiomyopathy after a migraine headache.  T wave inversions were noted on ECG and ejection fraction markedly reduced classic Takotsubo.  Resolution noted. ?

## 2021-05-17 NOTE — Assessment & Plan Note (Signed)
Currently incomplete noted on ECG.  Left anterior fascicular block noted as well.  No syncope.  No high risk symptoms. ?

## 2021-05-17 NOTE — Patient Instructions (Signed)

## 2021-05-17 NOTE — Progress Notes (Signed)
?Cardiology Office Note:   ? ?Date:  05/17/2021  ? ?ID:  Helen Swanson, DOB December 23, 1945, MRN [id number] ? ?PCP:  Philip Aspen, Limmie Patricia, MD ?  ?[facility] Providers ?Cardiologist:  Donato Schultz, MD    ? ?Referring MD: Philip Aspen, Estel*  ? ? ?History of Present Illness:   ? ?Helen Swanson is a 76 y.o. female here for one year follow-up of mitral valve repair from 2015 with prior Takotsubo like cardiomyopathy EF 15% that resolved.  Interestingly, a migraine preceded the event that caused the cardiomyopathy. ? ?She enjoys traveling quite a bit, [employer] over [employer] cruises.  She and her friend [name] enjoyed cruising the world. ? ?She is also had paroxysmal atrial tachycardia during hospitalization in 2015 as well. ? ?Battled tremors over the past few years.  Overall today she is feeling well no fevers chills nausea vomiting syncope bleeding. ? ? ? ?Today, She overall appears well and has remained compliant with her medications like metoprolol and Asprin. She had to take Cipro due to her urinary tract infection.  ? ?The patient denies chest pain, shortness of breath, nocturnal dyspnea, orthopnea or peripheral edema.There have been no palpitations, lightheadedness or syncope.  ? ?Past Medical History:  ?Diagnosis Date  ? Emphysema of lung (HCC)   ? a. By CXR, nonsmoker.  ? Heart murmur   ? Hyperlipidemia   ? Hypotension   ? IC (interstitial cystitis)   ? Migraine   ? a. New onset 11/2013.  ? Mitral regurgitation   ? a. s/p MV repair 2012.  ? MVP (mitral valve prolapse)   ? a. s/p MV repair 2012.  ? Osteopenia   ? Osteoporosis   ? Pancreatitis   ? a. At time of MV repair.  ? PAT (paroxysmal atrial tachycardia) (HCC)   ? a. Brief during 11/2013 hospitalization.  ? Pre-diabetes   ? Shingles   ? Tachycardia   ? Takotsubo cardiomyopathy   ? a. 11/2013: Stress cardiomyopathy (NICM) - EF 25% by cath, 15% by echo, elevated troponin felt due to this. Normal coronaries by cath 11/25/13.  ?  Transaminitis   ? Tremor   ? ? ?Past Surgical History:  ?Procedure Laterality Date  ? DILATION AND CURETTAGE OF UTERUS    ? x2  ? ELBOW SURGERY    ? right elbow for tendonitis  ? LEFT HEART CATHETERIZATION WITH CORONARY ANGIOGRAM N/A 11/25/2013  ? Procedure: LEFT HEART CATHETERIZATION WITH CORONARY ANGIOGRAM;  Surgeon: Lesleigh Noe, MD;  Location[facility] CATH LAB;  Service: Cardiovascular;  Laterality: N/A;  ? MITRAL VALVE REPAIR  1/12  ? Dr. Cornelius Moras  ? SHOULDER SURGERY  5/04  ? right  ? TOE SURGERY    ? TOTAL ABDOMINAL HYSTERECTOMY W/ BILATERAL SALPINGOOPHORECTOMY    ? WRIST SURGERY    ? left 10/06, right 7/07  ? ? ?Current Medications: ?Current Meds  ?Medication Sig  ? Ascorbic Acid (VITAMIN C PO) Take 1 tablet by mouth daily.  ? aspirin 81 MG tablet Take 81 mg by mouth daily.  ? ciprofloxacin (CIPRO) 250 MG tablet as directed per primary physcian  ? Multiple Vitamin (MULTIVITAMIN WITH MINERALS) TABS tablet Take 1 tablet by mouth daily.  ? nitrofurantoin, macrocrystal-monohydrate, (MACROBID) 100 MG capsule Take 100 mg by mouth every other day.   ? Omega-3 Fatty Acids (FISH OIL) 1200 MG CAPS Take 1,200 mg by mouth daily.  ? [DISCONTINUED] metoprolol succinate (TOPROL-XL) 25 MG 24 hr tablet Take 1 tablet (25  mg total) by mouth daily.  ?  ? ?Allergies:   Codeine  ? ?Social History  ? ?Socioeconomic History  ? Marital status: Single  ?  Spouse name: Not on file  ? Number of children: Not on file  ? Years of education: Not on file  ? Highest education level: Not on file  ?Occupational History  ? Not on file  ?Tobacco Use  ? Smoking status: Never  ? Smokeless tobacco: Never  ?Vaping Use  ? Vaping Use: Never used  ?Substance and Sexual Activity  ? Alcohol use: No  ? Drug use: No  ? Sexual activity: Not Currently  ?  Birth control/protection: Surgical  ?  Comment: TAH/BSO  ?Other Topics Concern  ? Not on file  ?Social History Narrative  ? Not on file  ? ?Social Determinants of Health  ? ?Financial Resource Strain: Not on  file  ?Food Insecurity: Not on file  ?Transportation Needs: Not on file  ?Physical Activity: Not on file  ?Stress: Not on file  ?Social Connections: Not on file  ?  ? ?Family History: ?The patient's family history includes CVA in her father; Diabetes in her mother; Hiatal hernia in her father; Migraines in her mother. There is no history of Breast cancer. ? ?ROS:   ?Please see the history of present illness.  ?   ? All other systems reviewed and are negative. ? ?EKGs/Labs/Other Studies Reviewed:   ?  ? ?EKG:  EKG is personally reviewed.  ? ?05/17/2021 EKG: Rate 74. Sinus Rhythm with incomplete right bundle branch block, left intravesicular block. ? ?05/12/2020 EKG: The ekg ordered today demonstrates sinus rhythm right bundle branch block, left anterior fascicular block ? ? ?Echo 06/15/2020:  ? ?IMPRESSIONS  ? 1. Left ventricular ejection fraction, by estimation, is 65 to 70%. Left  ?ventricular ejection fraction by 3D volume is 68 %. The left ventricle has  ?normal function. The left ventricle has no regional wall motion  ?abnormalities. Left ventricular diastolic  ? parameters are consistent with Grade I diastolic dysfunction (impaired  ?relaxation).  ? 2. Right ventricular systolic function is normal. The right ventricular  ?size is normal. There is normal pulmonary artery systolic pressure. The  ?estimated right ventricular systolic pressure is 23.8 mmHg.  ? 3. The mitral valve has been repaired/replaced. Trivial mitral valve  ?regurgitation. No evidence of mitral stenosis. There is a prosthetic  ?annuloplasty ring present in the mitral position. Procedure Date: 2011.  ? 4. The aortic valve is tricuspid. Aortic valve regurgitation is not  ?visualized.  ? 5. The inferior vena cava is normal in size with greater than 50%  ?respiratory variability, suggesting right atrial pressure of 3 mmHg.  ? ?Comparison(s): Changes from prior study are noted. 12/10/2013: LVEF 55-60%,  ?s/p MV repair, mild MR. ? ? ?Recent  Labs: ?10/29/2020: ALT 34; BUN 22; Creatinine, Ser 0.98; Hemoglobin 15.0; Platelets 249.0; Potassium 4.0; Sodium 143; TSH 4.63  ?Recent Lipid Panel ?   ?Component Value Date/Time  ? CHOL 220 (H) 10/29/2020 0844  ? CHOL 209 (H) 04/24/2019 1131  ? TRIG 265.0 (H) 10/29/2020 0844  ? HDL 57.80 10/29/2020 0844  ? HDL 58 04/24/2019 1131  ? CHOLHDL 4 10/29/2020 0844  ? VLDL 53.0 (H) 10/29/2020 0844  ? LDLCALC 124 (H) 04/24/2019 1131  ? LDLDIRECT 105.0 10/29/2020 0844  ? ? ? ?Risk Assessment/Calculations:   ? ? ? ?Physical Exam:   ? ?VS:  BP 120/70 (BP Location: Left Arm, Patient Position: Sitting, Cuff Size:  Normal)   Pulse 79   Ht 5\' 1"  (1.549 m)   Wt 104 lb (47.2 kg)   LMP 01/02/1989   SpO2 95%   BMI 19.65 kg/m?    ? ?Wt Readings from Last 3 Encounters:  ?05/17/21 104 lb (47.2 kg)  ?10/29/20 105 lb 6.4 oz (47.8 kg)  ?05/12/20 105 lb (47.6 kg)  ?  ? ?GEN:  Well nourished, well developed in no acute distress ?HEENT: Normal ?NECK: No JVD; No carotid bruits ?LYMPHATICS: No lymphadenopathy ?CARDIAC: RRR, no murmurs, rubs, gallops ?RESPIRATORY:  Clear to auscultation without rales, wheezing or rhonchi  ?ABDOMEN: Soft, non-tender, non-distended ?MUSCULOSKELETAL:  No edema; No deformity  ?SKIN: Warm and dry ?NEUROLOGIC:  Alert and oriented x 3 ?PSYCHIATRIC:  Normal affect  ? ?ASSESSMENT:   ? ?1. History of mitral valve repair   ?2. Mixed hyperlipidemia   ?3. Right bundle branch block   ?4. Stress-induced cardiomyopathy   ? ? ?PLAN:   ? ?In order of problems listed above: ? ?History of mitral valve repair ?Repaired in 2015 secondary to severe mitral regurgitation.  Echocardiogram 2022 reassuring with only mild MR.  Doing very well.  Aspirin, metoprolol.  Refills.  No change in medication management.  Understands dental antibiotics.  No shortness of breath no chest pain.  Wonderful. ? ?Hyperlipidemia ?No evidence of coronary artery disease on catheterization prior to the mitral valve repair.  Continue with aspirin and Toprol.   She is on fish oil as well.  LDL reviewed as above. ? ?Right bundle branch block ?Currently incomplete noted on ECG.  Left anterior fascicular block noted as well.  No syncope.  No high risk symptoms. ? ?Stress-induced cardiomyopa

## 2021-05-17 NOTE — Assessment & Plan Note (Signed)
No evidence of coronary artery disease on catheterization prior to the mitral valve repair.  Continue with aspirin and Toprol.  She is on fish oil as well.  LDL reviewed as above. ?

## 2021-06-07 DIAGNOSIS — H524 Presbyopia: Secondary | ICD-10-CM | POA: Diagnosis not present

## 2021-09-14 DIAGNOSIS — Z23 Encounter for immunization: Secondary | ICD-10-CM | POA: Diagnosis not present

## 2021-11-02 ENCOUNTER — Ambulatory Visit (INDEPENDENT_AMBULATORY_CARE_PROVIDER_SITE_OTHER): Payer: Medicare Other | Admitting: Internal Medicine

## 2021-11-02 VITALS — BP 120/80 | HR 76 | Temp 98.3°F | Ht 61.0 in | Wt 106.6 lb

## 2021-11-02 DIAGNOSIS — M810 Age-related osteoporosis without current pathological fracture: Secondary | ICD-10-CM | POA: Diagnosis not present

## 2021-11-02 DIAGNOSIS — Z0000 Encounter for general adult medical examination without abnormal findings: Secondary | ICD-10-CM | POA: Diagnosis not present

## 2021-11-02 DIAGNOSIS — E782 Mixed hyperlipidemia: Secondary | ICD-10-CM

## 2021-11-02 DIAGNOSIS — G250 Essential tremor: Secondary | ICD-10-CM | POA: Diagnosis not present

## 2021-11-02 LAB — VITAMIN D 25 HYDROXY (VIT D DEFICIENCY, FRACTURES): VITD: 63.17 ng/mL (ref 30.00–100.00)

## 2021-11-02 LAB — LIPID PANEL
Cholesterol: 188 mg/dL (ref 0–200)
HDL: 58.1 mg/dL (ref >39.00)
LDL Cholesterol: 93 mg/dL (ref 0–99)
NonHDL: 130.36
Total CHOL/HDL Ratio: 3
Triglycerides: 188 mg/dL — ABNORMAL HIGH (ref 0.0–149.0)
VLDL: 37.6 mg/dL (ref 0.0–40.0)

## 2021-11-02 LAB — CBC WITH DIFFERENTIAL/PLATELET
Basophils Absolute: 0 10*3/uL (ref 0.0–0.1)
Basophils Relative: 0.5 % (ref 0.0–3.0)
Eosinophils Absolute: 0.2 10*3/uL (ref 0.0–0.7)
Eosinophils Relative: 3.7 % (ref 0.0–5.0)
HCT: 45.9 % (ref 36.0–46.0)
Hemoglobin: 15.2 g/dL — ABNORMAL HIGH (ref 12.0–15.0)
Lymphocytes Relative: 20.4 % (ref 12.0–46.0)
Lymphs Abs: 0.9 10*3/uL (ref 0.7–4.0)
MCHC: 33.1 g/dL (ref 30.0–36.0)
MCV: 87.7 fl (ref 78.0–100.0)
Monocytes Absolute: 0.5 10*3/uL (ref 0.1–1.0)
Monocytes Relative: 10.2 % (ref 3.0–12.0)
Neutro Abs: 3 10*3/uL (ref 1.4–7.7)
Neutrophils Relative %: 65.2 % (ref 43.0–77.0)
Platelets: 256 10*3/uL (ref 150.0–400.0)
RBC: 5.23 Mil/uL — ABNORMAL HIGH (ref 3.87–5.11)
RDW: 13.7 % (ref 11.5–15.5)
WBC: 4.5 10*3/uL (ref 4.0–10.5)

## 2021-11-02 LAB — COMPREHENSIVE METABOLIC PANEL
ALT: 22 U/L (ref 0–35)
AST: 24 U/L (ref 0–37)
Albumin: 4.7 g/dL (ref 3.5–5.2)
Alkaline Phosphatase: 78 U/L (ref 39–117)
BUN: 23 mg/dL (ref 6–23)
CO2: 27 mEq/L (ref 19–32)
Calcium: 10.5 mg/dL (ref 8.4–10.5)
Chloride: 106 mEq/L (ref 96–112)
Creatinine, Ser: 0.94 mg/dL (ref 0.40–1.20)
GFR: 60.02 mL/min (ref >60.00)
Glucose, Bld: 86 mg/dL (ref 70–99)
Potassium: 4.6 mEq/L (ref 3.5–5.1)
Sodium: 142 mEq/L (ref 135–145)
Total Bilirubin: 0.5 mg/dL (ref 0.2–1.2)
Total Protein: 7.6 g/dL (ref 6.0–8.3)

## 2021-11-02 NOTE — Progress Notes (Signed)
Established Patient Office Visit     CC/Reason for Visit: Annual preventive exam and subsequent Medicare wellness visit  HPI: Helen Swanson is a 76 y.o. female who is coming in today for the above mentioned reasons. Past Medical History is significant for: Hyperlipidemia and benign essential tremor.  She is an avid cruiser, she is doing well and has no acute concerns or complaints.  She has routine eye and dental care, no perceived hearing difficulty.  All immunizations are up-to-date with exception of her second shingles.  All cancer screening is up-to-date.   Past Medical/Surgical History: Past Medical History:  Diagnosis Date   Emphysema of lung (HCC)    a. By CXR, nonsmoker.   Heart murmur    Hyperlipidemia    Hypotension    IC (interstitial cystitis)    Migraine    a. New onset 11/2013.   Mitral regurgitation    a. s/p MV repair 2012.   MVP (mitral valve prolapse)    a. s/p MV repair 2012.   Osteopenia    Osteoporosis    Pancreatitis    a. At time of MV repair.   PAT (paroxysmal atrial tachycardia) (HCC)    a. Brief during 11/2013 hospitalization.   Pre-diabetes    Shingles    Tachycardia    Takotsubo cardiomyopathy    a. 11/2013: Stress cardiomyopathy (NICM) - EF 25% by cath, 15% by echo, elevated troponin felt due to this. Normal coronaries by cath 11/25/13.   Transaminitis    Tremor     Past Surgical History:  Procedure Laterality Date   DILATION AND CURETTAGE OF UTERUS     x2   ELBOW SURGERY     right elbow for tendonitis   LEFT HEART CATHETERIZATION WITH CORONARY ANGIOGRAM N/A 11/25/2013   Procedure: LEFT HEART CATHETERIZATION WITH CORONARY ANGIOGRAM;  Surgeon: Lesleigh Noe, MD;  Location: [facility] CATH LAB;  Service: Cardiovascular;  Laterality: N/A;   MITRAL VALVE REPAIR  1/12   Dr. Cornelius Moras   SHOULDER SURGERY  5/04   right   TOE SURGERY     TOTAL ABDOMINAL HYSTERECTOMY W/ BILATERAL SALPINGOOPHORECTOMY     WRIST SURGERY     left 10/06, right  7/07    Social History:  reports that she has never smoked. She has never used smokeless tobacco. She reports that she does not drink alcohol and does not use drugs.  Allergies: Allergies  Allergen Reactions   Codeine Nausea And Vomiting    NAUSEA    Family History:  Family History  Problem Relation Age of Onset   Diabetes Mother    Migraines Mother    CVA Father        mild MI   Hiatal hernia Father    Breast cancer Neg Hx      Current Outpatient Medications:    Ascorbic Acid (VITAMIN C PO), Take 1 tablet by mouth daily., Disp: , Rfl:    aspirin 81 MG tablet, Take 81 mg by mouth daily., Disp: , Rfl:    ciprofloxacin (CIPRO) 250 MG tablet, as directed per primary physcian, Disp: , Rfl: 3   metoprolol succinate (TOPROL-XL) 25 MG 24 hr tablet, Take 1 tablet (25 mg total) by mouth daily., Disp: 90 tablet, Rfl: 3   Multiple Vitamin (MULTIVITAMIN WITH MINERALS) TABS tablet, Take 1 tablet by mouth daily., Disp: , Rfl:    nitrofurantoin, macrocrystal-monohydrate, (MACROBID) 100 MG capsule, Take 100 mg by mouth every other day. , Disp: ,  Rfl:    Omega-3 Fatty Acids (FISH OIL) 1200 MG CAPS, Take 1,200 mg by mouth daily., Disp: , Rfl:   Review of Systems:  Constitutional: Denies fever, chills, diaphoresis, appetite change and fatigue.  HEENT: Denies photophobia, eye pain, redness, hearing loss, ear pain, congestion, sore throat, rhinorrhea, sneezing, mouth sores, trouble swallowing, neck pain, neck stiffness and tinnitus.   Respiratory: Denies SOB, DOE, cough, chest tightness,  and wheezing.   Cardiovascular: Denies chest pain, palpitations and leg swelling.  Gastrointestinal: Denies nausea, vomiting, abdominal pain, diarrhea, constipation, blood in stool and abdominal distention.  Genitourinary: Denies dysuria, urgency, frequency, hematuria, flank pain and difficulty urinating.  Endocrine: Denies: hot or cold intolerance, sweats, changes in hair or nails, polyuria,  polydipsia. Musculoskeletal: Denies myalgias, back pain, joint swelling, arthralgias and gait problem.  Skin: Denies pallor, rash and wound.  Neurological: Denies dizziness, seizures, syncope, weakness, light-headedness, numbness and headaches.  Hematological: Denies adenopathy. Easy bruising, personal or family bleeding history  Psychiatric/Behavioral: Denies suicidal ideation, mood changes, confusion, nervousness, sleep disturbance and agitation    Physical Exam: Vitals:   11/02/21 0815  BP: 120/80  Pulse: 76  Temp: 98.3 F (36.8 C)  TempSrc: Oral  SpO2: 98%  Weight: 106 lb 9.6 oz (48.4 kg)  Height: 5\' 1"  (1.549 m)    Body mass index is 20.14 kg/m.    Constitutional: NAD, calm, comfortable Eyes: PERRL, lids and conjunctivae normal ENMT: Mucous membranes are moist. Posterior pharynx clear of any exudate or lesions. Normal dentition. Tympanic membrane is pearly white, no erythema or bulging. Neck: normal, supple, no masses, no thyromegaly Respiratory: clear to auscultation bilaterally, no wheezing, no crackles. Normal respiratory effort. No accessory muscle use.  Cardiovascular: Regular rate and rhythm, no murmurs / rubs / gallops. No extremity edema. 2+ pedal pulses. No carotid bruits.  Abdomen: no tenderness, no masses palpated. No hepatosplenomegaly. Bowel sounds positive.  Musculoskeletal: no clubbing / cyanosis. No joint deformity upper and lower extremities. Good ROM, no contractures. Normal muscle tone.  Skin: no rashes, lesions, ulcers. No induration Neurologic: CN 2-12 grossly intact. Sensation intact, DTR normal. Strength 5/5 in all 4.  Psychiatric: Normal judgment and insight. Alert and oriented x 3. Normal mood.    Subsequent Medicare wellness visit   1. Risk factors, based on past  M,S,F -cardiovascular disease risk factors include age and hyperlipidemia   2.  Physical activities: Walks on a daily basis   3.  Depression/mood: Stable, not depressed   4.   Hearing: No perceived issues   5.  ADL's: Independent in all ADLs   6.  Fall risk: Low fall risk   7.  Home safety: No problems identified   8.  Height weight, and visual acuity: height and weight as above, vision:  Vision Screening   Right eye Left eye Both eyes  Without correction     With correction 20/30 20/30 20/30      9.  Counseling: Advised to increase physical activity and update all age-appropriate vaccinations   10. Lab orders based on risk factors: Laboratory update will be reviewed   11. Referral : None today   12. Care plan: Follow-up with me in 1 year   13. Cognitive assessment: No active impairment   14. Screening: Patient provided with a written and personalized 5-10 year screening schedule in the AVS. yes   15. Provider List Update: PCP, cardiology  16. Advance Directives: Full code   17. Opioids: Patient is not on any opioid prescriptions and has  no risk factors for a substance use disorder.   [place] Office Visit from 10/29/2020 in [place] [facility]  PHQ-9 Total Score 1          08/21/2019   10:19 AM 10/29/2020    8:44 AM 11/02/2021    8:21 AM  Fall Risk  Falls in the past year? 0 0 0  Was there an injury with Fall? 0 0 0  Fall Risk Category Calculator 0 0 0  Fall Risk Category Low Low Low  Patient Fall Risk Level   Low fall risk  Patient at Risk for Falls Due to   No Fall Risks  Fall risk Follow up   Falls evaluation completed     Impression and Plan:  Encounter for preventive health examination  Mixed hyperlipidemia - Plan: CBC with Differential/Platelet, Comprehensive metabolic panel, Lipid panel, Lipid panel, Comprehensive metabolic panel, CBC with Differential/Platelet  Benign essential tremor  Age-related osteoporosis without current pathological fracture - Plan: VITAMIN D 25 Hydroxy (Vit-D Deficiency, Fractures), VITAMIN D 25 Hydroxy (Vit-D Deficiency, Fractures)    -Recommend routine eye and dental  care. -Immunizations: All immunizations are up-to-date, she is due for second shingles which she will be getting in November -Healthy lifestyle discussed in detail. -Labs to be updated today. -Colon cancer screening: Cologuard was normal in November/2022 -Breast cancer screening: 04/2021 -Cervical cancer screening: Declines -Lung cancer screening: Not applicable -Prostate cancer screening: Not applicable -DEXA: DEXA 1/30 with osteoporosis, declines treatment.    Lelon Frohlich, MD [facility]

## 2021-12-15 DIAGNOSIS — R350 Frequency of micturition: Secondary | ICD-10-CM | POA: Diagnosis not present

## 2021-12-15 DIAGNOSIS — N3020 Other chronic cystitis without hematuria: Secondary | ICD-10-CM | POA: Diagnosis not present

## 2021-12-28 ENCOUNTER — Other Ambulatory Visit: Payer: Self-pay | Admitting: Internal Medicine

## 2021-12-28 DIAGNOSIS — Z1231 Encounter for screening mammogram for malignant neoplasm of breast: Secondary | ICD-10-CM

## 2022-04-13 ENCOUNTER — Ambulatory Visit
Admission: RE | Admit: 2022-04-13 | Discharge: 2022-04-13 | Disposition: A | Discharge status: Home / Self Care | Payer: Medicare Other | Source: Ambulatory Visit | Attending: Internal Medicine | Admitting: Internal Medicine

## 2022-04-13 DIAGNOSIS — Z1231 Encounter for screening mammogram for malignant neoplasm of breast: Secondary | ICD-10-CM | POA: Diagnosis not present

## 2022-05-19 ENCOUNTER — Ambulatory Visit: Payer: Medicare Other | Attending: Cardiology | Admitting: Cardiology

## 2022-05-19 VITALS — BP 126/72 | HR 85 | Ht 61.0 in | Wt 106.4 lb

## 2022-05-19 DIAGNOSIS — E782 Mixed hyperlipidemia: Secondary | ICD-10-CM | POA: Diagnosis not present

## 2022-05-19 DIAGNOSIS — I4510 Unspecified right bundle-branch block: Secondary | ICD-10-CM

## 2022-05-19 DIAGNOSIS — Z9889 Other specified postprocedural states: Secondary | ICD-10-CM | POA: Diagnosis not present

## 2022-05-19 MED ORDER — METOPROLOL SUCCINATE ER 25 MG PO TB24: 25.0000 mg | ORAL_TABLET | Freq: Every day | ORAL | 3 refills | Status: DC

## 2022-05-19 NOTE — Patient Instructions (Signed)
Medication Instructions:  Your physician recommends that you continue on your current medications as directed. Please refer to the Current Medication list given to you today.  *If you need a refill on your cardiac medications before your next appointment, please call your pharmacy*   Follow-Up: At [facility], you and your health needs are our priority.  As part of our continuing mission to provide you with exceptional heart care, we have created designated Provider Care Teams.  These Care Teams include your primary Cardiologist (physician) and Advanced Practice Providers (APPs -  Physician Assistants and Nurse Practitioners) who all work together to provide you with the care you need, when you need it.   Your next appointment:   1 year(s)  Provider:   Mark Skains, MD     

## 2022-05-19 NOTE — Progress Notes (Signed)
Cardiology Office Note:    Date:  05/19/2022   ID:  Helen Swanson, DOB Apr 06, 1945, MRN [id number]  PCP:  Helen Swanson, Helen Patricia, MD   [facility] Providers Cardiologist:  Helen Schultz, MD     Referring MD: Helen Swanson, Estel*    History of Present Illness:    Helen Swanson is a 77 y.o. female here for one year follow-up of mitral valve repair from 2015 with prior Takotsubo like cardiomyopathy EF 15% that resolved.  Interestingly, a migraine preceded the event that caused the cardiomyopathy.  She enjoys traveling quite a bit, [employer] over [employer] cruises.  She and her friend [name], [name] enjoyed cruising the world.  She is also had paroxysmal atrial tachycardia during hospitalization in 2015 as well.  Battled tremors over the past few years.  Overall today she is feeling well no fevers chills nausea vomiting syncope bleeding.   Past Medical History:  Diagnosis Date   Emphysema of lung (HCC)    a. By CXR, nonsmoker.   Heart murmur    Hyperlipidemia    Hypotension    IC (interstitial cystitis)    Migraine    a. New onset 11/2013.   Mitral regurgitation    a. s/p MV repair 2012.   MVP (mitral valve prolapse)    a. s/p MV repair 2012.   Osteopenia    Osteoporosis    Pancreatitis    a. At time of MV repair.   PAT (paroxysmal atrial tachycardia)    a. Brief during 11/2013 hospitalization.   Pre-diabetes    Shingles    Tachycardia    Takotsubo cardiomyopathy    a. 11/2013: Stress cardiomyopathy (NICM) - EF 25% by cath, 15% by echo, elevated troponin felt due to this. Normal coronaries by cath 11/25/13.   Transaminitis    Tremor     Past Surgical History:  Procedure Laterality Date   DILATION AND CURETTAGE OF UTERUS     x2   ELBOW SURGERY     right elbow for tendonitis   LEFT HEART CATHETERIZATION WITH CORONARY ANGIOGRAM N/A 11/25/2013   Procedure: LEFT HEART CATHETERIZATION WITH CORONARY ANGIOGRAM;  Surgeon: Lesleigh Noe, MD;   Location: [facility] CATH LAB;  Service: Cardiovascular;  Laterality: N/A;   MITRAL VALVE REPAIR  1/12   Dr. Cornelius Moras   SHOULDER SURGERY  5/04   right   TOE SURGERY     TOTAL ABDOMINAL HYSTERECTOMY W/ BILATERAL SALPINGOOPHORECTOMY     WRIST SURGERY     left 10/06, right 7/07    Current Medications: Current Meds  Medication Sig   Ascorbic Acid (VITAMIN C PO) Take 1 tablet by mouth daily.   aspirin 81 MG tablet Take 81 mg by mouth daily.   ciprofloxacin (CIPRO) 250 MG tablet as directed per primary physcian   Multiple Vitamin (MULTIVITAMIN WITH MINERALS) TABS tablet Take 1 tablet by mouth daily.   nitrofurantoin, macrocrystal-monohydrate, (MACROBID) 100 MG capsule Take 100 mg by mouth every other day.    Omega-3 Fatty Acids (FISH OIL) 1200 MG CAPS Take 1,200 mg by mouth daily.   [DISCONTINUED] metoprolol succinate (TOPROL-XL) 25 MG 24 hr tablet Take 1 tablet (25 mg total) by mouth daily.     Allergies:   Codeine   Social History   Socioeconomic History   Marital status: Single    Spouse name: Not on file   Number of children: Not on file   Years of education: Not on file   Highest  education level: Not on file  Occupational History   Not on file  Tobacco Use   Smoking status: Never   Smokeless tobacco: Never  Vaping Use   Vaping Use: Never used  Substance and Sexual Activity   Alcohol use: No   Drug use: No   Sexual activity: Not Currently    Birth control/protection: Surgical    Comment: TAH/BSO  Other Topics Concern   Not on file  Social History Narrative   Not on file   Social Determinants of Health   Financial Resource Strain: Not on file  Food Insecurity: Not on file  Transportation Needs: Not on file  Physical Activity: Not on file  Stress: Not on file  Social Connections: Not on file     Family History: The patient's family history includes CVA in her father; Diabetes in her mother; Hiatal hernia in her father; Migraines in her mother. There is no history of  Breast cancer.  ROS:   Please see the history of present illness.      All other systems reviewed and are negative.  EKGs/Labs/Other Studies Reviewed:      EKG:  EKG is personally reviewed.  05/19/2022-sinus rhythm 80 PVC incomplete right bundle branch block 05/17/2021 EKG: Rate 74. Sinus Rhythm with incomplete right bundle branch block, left intravesicular block.  05/12/2020 EKG: The ekg ordered today demonstrates sinus rhythm right bundle branch block, left anterior fascicular block   Echo 06/15/2020:   IMPRESSIONS   1. Left ventricular ejection fraction, by estimation, is 65 to 70%. Left  ventricular ejection fraction by 3D volume is 68 %. The left ventricle has  normal function. The left ventricle has no regional wall motion  abnormalities. Left ventricular diastolic   parameters are consistent with Grade I diastolic dysfunction (impaired  relaxation).   2. Right ventricular systolic function is normal. The right ventricular  size is normal. There is normal pulmonary artery systolic pressure. The  estimated right ventricular systolic pressure is 23.8 mmHg.   3. The mitral valve has been repaired/replaced. Trivial mitral valve  regurgitation. No evidence of mitral stenosis. There is a prosthetic  annuloplasty ring present in the mitral position. Procedure Date: 2011.   4. The aortic valve is tricuspid. Aortic valve regurgitation is not  visualized.   5. The inferior vena cava is normal in size with greater than 50%  respiratory variability, suggesting right atrial pressure of 3 mmHg.   Comparison(s): Changes from prior study are noted. 12/10/2013: LVEF 55-60%,  s/p MV repair, mild MR.   Recent Labs: 11/02/2021: ALT 22; BUN 23; Creatinine, Ser 0.94; Hemoglobin 15.2; Platelets 256.0; Potassium 4.6; Sodium 142  Recent Lipid Panel    Component Value Date/Time   CHOL 188 11/02/2021 0909   CHOL 209 (H) 04/24/2019 1131   TRIG 188.0 (H) 11/02/2021 0909   HDL 58.10 11/02/2021  0909   HDL 58 04/24/2019 1131   CHOLHDL 3 11/02/2021 0909   VLDL 37.6 11/02/2021 0909   LDLCALC 93 11/02/2021 0909   LDLCALC 124 (H) 04/24/2019 1131   LDLDIRECT 105.0 10/29/2020 0844     Risk Assessment/Calculations:      Physical Exam:    VS:  BP 126/72   Pulse 85   Ht 5\' 1"  (1.549 m)   Wt 106 lb 6.4 oz (48.3 kg)   LMP 01/02/1989   SpO2 97%   BMI 20.10 kg/m     Wt Readings from Last 3 Encounters:  05/19/22 106 lb 6.4 oz (48.3 kg)  11/02/21 106 lb 9.6 oz (48.4 kg)  05/17/21 104 lb (47.2 kg)     GEN: Well nourished, well developed, in no acute distress HEENT: normal Neck: no JVD, carotid bruits, or masses Cardiac: RRR; no murmurs, rubs, or gallops,no edema  Respiratory:  clear to auscultation bilaterally, normal work of breathing GI: soft, nontender, nondistended, + BS MS: no deformity or atrophy Skin: warm and dry, no rash Neuro:  Alert and Oriented x 3, Strength and sensation are intact Psych: euthymic mood, full affect   ASSESSMENT:    1. History of mitral valve repair   2. Mixed hyperlipidemia   3. Right bundle branch block      PLAN:    In order of problems listed above:   History of mitral valve repair Repaired in 2015 secondary to severe mitral regurgitation.  Echocardiogram 2022 reassuring with only mild MR.  Doing very well.  Aspirin, metoprolol.  Refills.  No change in medication management.  Understands dental antibiotics.  No shortness of breath no chest pain.  Wonderful. No changes  Hyperlipidemia No evidence of coronary artery disease on catheterization prior to the mitral valve repair.  Continue with aspirin and Toprol.  She is on fish oil as well.  LDL reviewed as above. No changes. LDL 83  Right bundle branch block Currently incomplete noted on ECG.  Left anterior fascicular block noted as well.  No syncope.  No high risk symptoms. No pacer needs  Stress-induced cardiomyopathy Had a prior episode of stress-induced cardiomyopathy after  a migraine headache.  T wave inversions were noted on ECG and ejection fraction markedly reduced classic Takotsubo.  Resolution noted.   Medication Adjustments/Labs and Tests Ordered: Current medicines are reviewed at length with the patient today.  Concerns regarding medicines are outlined above.  Orders Placed This Encounter  Procedures   EKG 12-Lead   Meds ordered this encounter  Medications   metoprolol succinate (TOPROL-XL) 25 MG 24 hr tablet    Sig: Take 1 tablet (25 mg total) by mouth daily.    Dispense:  90 tablet    Refill:  3    Please hold on file - pt will call when she needs RX filled    Patient Instructions  Medication Instructions:  Your physician recommends that you continue on your current medications as directed. Please refer to the Current Medication list given to you today.  *If you need a refill on your cardiac medications before your next appointment, please call your pharmacy*  Follow-Up: At [facility], you and your health needs are our priority.  As part of our continuing mission to provide you with exceptional heart care, we have created designated Provider Care Teams.  These Care Teams include your primary Cardiologist (physician) and Advanced Practice Providers (APPs -  Physician Assistants and Nurse Practitioners) who all work together to provide you with the care you need, when you need it.   Your next appointment:   1 year(s)  Provider:   Donato Schultz, MD        F/U in 1 year  I,Tinashe Williams,acting as a scribe for Helen Schultz, MD.,have documented all relevant documentation on the behalf of Helen Schultz, MD,as directed by  Helen Schultz, MD while in the presence of Helen Schultz, MD.   I, Helen Schultz, MD, have reviewed all documentation for this visit. The documentation on 05/19/22 for the exam, diagnosis, procedures, and orders are all accurate and complete.

## 2022-06-08 DIAGNOSIS — H5213 Myopia, bilateral: Secondary | ICD-10-CM | POA: Diagnosis not present

## 2022-10-06 ENCOUNTER — Ambulatory Visit (INDEPENDENT_AMBULATORY_CARE_PROVIDER_SITE_OTHER): Payer: Medicare Other | Admitting: *Deleted

## 2022-10-06 DIAGNOSIS — Z23 Encounter for immunization: Secondary | ICD-10-CM | POA: Diagnosis not present

## 2022-12-13 ENCOUNTER — Encounter: Payer: Self-pay | Admitting: Internal Medicine

## 2022-12-13 ENCOUNTER — Ambulatory Visit: Payer: Medicare Other | Admitting: Internal Medicine

## 2022-12-13 ENCOUNTER — Other Ambulatory Visit: Payer: Self-pay | Admitting: Internal Medicine

## 2022-12-13 VITALS — BP 120/78 | HR 80 | Temp 98.2°F | Ht 60.5 in | Wt 107.2 lb

## 2022-12-13 DIAGNOSIS — E782 Mixed hyperlipidemia: Secondary | ICD-10-CM

## 2022-12-13 DIAGNOSIS — Z0000 Encounter for general adult medical examination without abnormal findings: Secondary | ICD-10-CM

## 2022-12-13 LAB — LIPID PANEL
Cholesterol: 196 mg/dL (ref 0–200)
HDL: 54.5 mg/dL (ref >39.00)
LDL Cholesterol: 101 mg/dL — ABNORMAL HIGH (ref 0–99)
NonHDL: 141.77
Total CHOL/HDL Ratio: 4
Triglycerides: 204 mg/dL — ABNORMAL HIGH (ref 0.0–149.0)
VLDL: 40.8 mg/dL — ABNORMAL HIGH (ref 0.0–40.0)

## 2022-12-13 LAB — CBC WITH DIFFERENTIAL/PLATELET
Basophils Absolute: 0 10*3/uL (ref 0.0–0.1)
Basophils Relative: 0.9 % (ref 0.0–3.0)
Eosinophils Absolute: 0.2 10*3/uL (ref 0.0–0.7)
Eosinophils Relative: 2.8 % (ref 0.0–5.0)
HCT: 45.5 % (ref 36.0–46.0)
Hemoglobin: 15.1 g/dL — ABNORMAL HIGH (ref 12.0–15.0)
Lymphocytes Relative: 16 % (ref 12.0–46.0)
Lymphs Abs: 0.9 10*3/uL (ref 0.7–4.0)
MCHC: 33.2 g/dL (ref 30.0–36.0)
MCV: 90.8 fL (ref 78.0–100.0)
Monocytes Absolute: 0.4 10*3/uL (ref 0.1–1.0)
Monocytes Relative: 7.4 % (ref 3.0–12.0)
Neutro Abs: 4 10*3/uL (ref 1.4–7.7)
Neutrophils Relative %: 72.9 % (ref 43.0–77.0)
Platelets: 280 10*3/uL (ref 150.0–400.0)
RBC: 5.01 Mil/uL (ref 3.87–5.11)
RDW: 13.8 % (ref 11.5–15.5)
WBC: 5.5 10*3/uL (ref 4.0–10.5)

## 2022-12-13 LAB — COMPREHENSIVE METABOLIC PANEL
ALT: 26 U/L (ref 0–35)
AST: 27 U/L (ref 0–37)
Albumin: 4.5 g/dL (ref 3.5–5.2)
Alkaline Phosphatase: 94 U/L (ref 39–117)
BUN: 19 mg/dL (ref 6–23)
CO2: 28 meq/L (ref 19–32)
Calcium: 10.8 mg/dL — ABNORMAL HIGH (ref 8.4–10.5)
Chloride: 105 meq/L (ref 96–112)
Creatinine, Ser: 0.97 mg/dL (ref 0.40–1.20)
GFR: 57.35 mL/min — ABNORMAL LOW (ref >60.00)
Glucose, Bld: 93 mg/dL (ref 70–99)
Potassium: 5.2 meq/L — ABNORMAL HIGH (ref 3.5–5.1)
Sodium: 143 meq/L (ref 135–145)
Total Bilirubin: 0.5 mg/dL (ref 0.2–1.2)
Total Protein: 7.2 g/dL (ref 6.0–8.3)

## 2022-12-13 LAB — TSH: TSH: 4.39 u[IU]/mL (ref 0.35–5.50)

## 2022-12-13 LAB — VITAMIN D 25 HYDROXY (VIT D DEFICIENCY, FRACTURES): VITD: 62.81 ng/mL (ref 30.00–100.00)

## 2022-12-13 LAB — VITAMIN B12: Vitamin B-12: 923 pg/mL — ABNORMAL HIGH (ref 211–911)

## 2022-12-13 NOTE — Progress Notes (Signed)
Established Patient Office Visit     CC/Reason for Visit: Annual preventive exam and subsequent Medicare wellness visit  HPI: Helen Swanson is a 77 y.o. female who is coming in today for the above mentioned reasons. Past Medical History is significant for: Hyperlipidemia and a benign essential tremor.  Feeling well without acute concerns or complaints.  She is an avid traveler.  She is only due for an RSV update.  Cancer screening is up-to-date.   Past Medical/Surgical History: Past Medical History:  Diagnosis Date   Emphysema of lung (HCC)    a. By CXR, nonsmoker.   Heart murmur    Hyperlipidemia    Hypotension    IC (interstitial cystitis)    Migraine    a. New onset 11/2013.   Mitral regurgitation    a. s/p MV repair 2012.   MVP (mitral valve prolapse)    a. s/p MV repair 2012.   Osteopenia    Osteoporosis    Pancreatitis    a. At time of MV repair.   PAT (paroxysmal atrial tachycardia) (HCC)    a. Brief during 11/2013 hospitalization.   Pre-diabetes    Shingles    Tachycardia    Takotsubo cardiomyopathy    a. 11/2013: Stress cardiomyopathy (NICM) - EF 25% by cath, 15% by echo, elevated troponin felt due to this. Normal coronaries by cath 11/25/13.   Transaminitis    Tremor     Past Surgical History:  Procedure Laterality Date   DILATION AND CURETTAGE OF UTERUS     x2   ELBOW SURGERY     right elbow for tendonitis   LEFT HEART CATHETERIZATION WITH CORONARY ANGIOGRAM N/A 11/25/2013   Procedure: LEFT HEART CATHETERIZATION WITH CORONARY ANGIOGRAM;  Surgeon: Lesleigh Noe, MD;  Location: [facility] CATH LAB;  Service: Cardiovascular;  Laterality: N/A;   MITRAL VALVE REPAIR  1/12   Dr. Cornelius Moras   SHOULDER SURGERY  5/04   right   TOE SURGERY     TOTAL ABDOMINAL HYSTERECTOMY W/ BILATERAL SALPINGOOPHORECTOMY     WRIST SURGERY     left 10/06, right 7/07    Social History:  reports that she has never smoked. She has never used smokeless tobacco. She reports  that she does not drink alcohol and does not use drugs.  Allergies: Allergies  Allergen Reactions   Codeine Nausea And Vomiting    NAUSEA    Family History:  Family History  Problem Relation Age of Onset   Diabetes Mother    Migraines Mother    CVA Father        mild MI   Hiatal hernia Father    Breast cancer Neg Hx      Current Outpatient Medications:    Ascorbic Acid (VITAMIN C PO), Take 1 tablet by mouth daily., Disp: , Rfl:    aspirin 81 MG tablet, Take 81 mg by mouth daily., Disp: , Rfl:    ciprofloxacin (CIPRO) 250 MG tablet, as directed per primary physcian, Disp: , Rfl: 3   metoprolol succinate (TOPROL-XL) 25 MG 24 hr tablet, Take 1 tablet (25 mg total) by mouth daily., Disp: 90 tablet, Rfl: 3   Multiple Vitamin (MULTIVITAMIN WITH MINERALS) TABS tablet, Take 1 tablet by mouth daily., Disp: , Rfl:    nitrofurantoin, macrocrystal-monohydrate, (MACROBID) 100 MG capsule, Take 100 mg by mouth every other day. , Disp: , Rfl:    Omega-3 Fatty Acids (FISH OIL) 1200 MG CAPS, Take 1,200 mg by mouth  daily., Disp: , Rfl:   Review of Systems:  Negative unless indicated in HPI.   Physical Exam: Vitals:   12/13/22 0844  BP: 120/78  Pulse: 80  Temp: 98.2 F (36.8 C)  TempSrc: Oral  SpO2: 96%  Weight: 107 lb 3.2 oz (48.6 kg)  Height: 5' 0.5" (1.537 m)    Body mass index is 20.59 kg/m.   Physical Exam Vitals reviewed.  Constitutional:      General: She is not in acute distress.    Appearance: Normal appearance. She is not ill-appearing, toxic-appearing or diaphoretic.  HENT:     Head: Normocephalic.     Right Ear: Tympanic membrane, ear canal and external ear normal. There is no impacted cerumen.     Left Ear: Tympanic membrane, ear canal and external ear normal. There is no impacted cerumen.     Nose: Nose normal.     Mouth/Throat:     Mouth: Mucous membranes are moist.     Pharynx: Oropharynx is clear. No oropharyngeal exudate or posterior oropharyngeal  erythema.  Eyes:     General: No scleral icterus.       Right eye: No discharge.        Left eye: No discharge.     Conjunctiva/sclera: Conjunctivae normal.     Pupils: Pupils are equal, round, and reactive to light.  Neck:     Vascular: No carotid bruit.  Cardiovascular:     Rate and Rhythm: Normal rate and regular rhythm.     Pulses: Normal pulses.     Heart sounds: Normal heart sounds.  Pulmonary:     Effort: Pulmonary effort is normal. No respiratory distress.     Breath sounds: Normal breath sounds.  Abdominal:     General: Abdomen is flat. Bowel sounds are normal.     Palpations: Abdomen is soft.  Musculoskeletal:        General: Normal range of motion.     Cervical back: Normal range of motion.  Skin:    General: Skin is warm and dry.  Neurological:     General: No focal deficit present.     Mental Status: She is alert and oriented to person, place, and time. Mental status is at baseline.  Psychiatric:        Mood and Affect: Mood normal.        Behavior: Behavior normal.        Thought Content: Thought content normal.        Judgment: Judgment normal.     Subsequent Medicare wellness visit   1. Risk factors, based on past  M,S,F - Cardiac Risk Factors include: advanced age (>40men, >55 women);hypertension   2.  Physical activities: Dietary issues and exercise activities discussed:      3.  Depression/mood:  Flowsheet Row Office Visit from 12/13/2022 in [facility] Total Score 1        4.  ADL's:    12/13/2022    8:38 AM  In your present state of health, do you have any difficulty performing the following activities:  Hearing? 0  Vision? 0  Difficulty concentrating or making decisions? 0  Walking or climbing stairs? 0  Dressing or bathing? 0  Doing errands, shopping? 0  Preparing Food and eating ? N  Using the Toilet? N  In the past six months, have you accidently leaked urine? N  Do you have problems with  loss of bowel control? N  Managing your  Medications? N  Managing your Finances? N  Housekeeping or managing your Housekeeping? N     5.  Fall risk:     08/21/2019   10:19 AM 10/29/2020    8:44 AM 11/02/2021    8:21 AM 12/13/2022    8:40 AM  Fall Risk  Falls in the past year? 0 0 0 0  Was there an injury with Fall? 0 0 0 0  Fall Risk Category Calculator 0 0 0 0  Fall Risk Category (Retired) Low Low Low   (RETIRED) Patient Fall Risk Level   Low fall risk   Patient at Risk for Falls Due to   No Fall Risks   Fall risk Follow up   Falls evaluation completed Falls evaluation completed     6.  Home safety: No problems identified   7.  Height weight, and visual acuity: height and weight as above, vision/hearing: 20/25 bilateral   8.  Counseling: Counseling given: Not Answered    9. Lab orders based on risk factors: Laboratory update will be reviewed   10. Cognitive assessment:        12/13/2022    8:41 AM  6CIT Screen  What Year? 0 points  What month? 0 points  What time? 0 points  Count back from 20 0 points  Months in reverse 0 points  Repeat phrase 0 points  Total Score 0 points     11. Screening: Patient provided with a written and personalized 5-10 year screening schedule in the AVS. Health Maintenance  Topic Date Due   Colon Cancer Screening  Never done   Zoster (Shingles) Vaccine (2 of 2) 09/06/2021   COVID-19 Vaccine (6 - 2023-24 season) 09/03/2022   Medicare Annual Wellness Visit  12/13/2023   DTaP/Tdap/Td vaccine (2 - Td or Tdap) 07/28/2026   Pneumonia Vaccine  Completed   Flu Shot  Completed   DEXA scan (bone density measurement)  Completed   Hepatitis C Screening  Completed   HPV Vaccine  Aged Out    12. Provider List Update: Patient Care Team    Relationship Specialty Notifications Start End  Philip Aspen, Limmie Patricia, MD PCP - General Internal Medicine  08/21/19   Jake Bathe, MD PCP - Cardiology Cardiology Admissions 05/10/18      13.  Advance Directives: Does Patient Have a Medical Advance Directive?: Yes Type of Advance Directive: Healthcare Power of Attorney, Living will, Out of facility DNR (pink MOST or yellow form) Does patient want to make changes to medical advance directive?: No - Patient declined Copy of Healthcare Power of Attorney in Chart?: No - copy requested  14. Opioids: Patient is not on any opioid prescriptions and has no risk factors for a substance use disorder.   15.   Goals      travel         I have personally reviewed and noted the following in the patient's chart:   Medical and social history Use of alcohol, tobacco or illicit drugs  Current medications and supplements Functional ability and status Nutritional status Physical activity Advanced directives List of other physicians Hospitalizations, surgeries, and ER visits in previous 12 months Vitals Screenings to include cognitive, depression, and falls Referrals and appointments  In addition, I have reviewed and discussed with patient certain preventive protocols, quality metrics, and best practice recommendations. A written personalized care plan for preventive services as well as general preventive health recommendations were provided to patient.  Impression and Plan:  Mixed hyperlipidemia -  CBC with Differential/Platelet; Future -     Comprehensive metabolic panel; Future -     Lipid panel; Future -     TSH; Future -     Vitamin B12; Future -     VITAMIN D 25 Hydroxy (Vit-D Deficiency, Fractures); Future  Medicare annual wellness visit, subsequent   -Recommend routine eye and dental care. -Healthy lifestyle discussed in detail. -Labs to be updated today. -Prostate cancer screening: N/A Health Maintenance  Topic Date Due   Colon Cancer Screening  Never done   Zoster (Shingles) Vaccine (2 of 2) 09/06/2021   COVID-19 Vaccine (6 - 2023-24 season) 09/03/2022   Medicare Annual Wellness Visit  12/13/2023    DTaP/Tdap/Td vaccine (2 - Td or Tdap) 07/28/2026   Pneumonia Vaccine  Completed   Flu Shot  Completed   DEXA scan (bone density measurement)  Completed   Hepatitis C Screening  Completed   HPV Vaccine  Aged Out         Reya Aurich Philip Aspen, MD [place] [facility]

## 2022-12-14 DIAGNOSIS — N3020 Other chronic cystitis without hematuria: Secondary | ICD-10-CM | POA: Diagnosis not present

## 2022-12-14 DIAGNOSIS — R350 Frequency of micturition: Secondary | ICD-10-CM | POA: Diagnosis not present

## 2023-01-02 ENCOUNTER — Other Ambulatory Visit: Payer: Self-pay | Admitting: Internal Medicine

## 2023-01-02 DIAGNOSIS — Z1231 Encounter for screening mammogram for malignant neoplasm of breast: Secondary | ICD-10-CM

## 2023-04-16 ENCOUNTER — Ambulatory Visit
Admission: RE | Admit: 2023-04-16 | Discharge: 2023-04-16 | Disposition: A | Discharge status: Home / Self Care | Payer: Medicare Other | Source: Ambulatory Visit | Attending: Internal Medicine | Admitting: Internal Medicine

## 2023-04-16 DIAGNOSIS — Z1231 Encounter for screening mammogram for malignant neoplasm of breast: Secondary | ICD-10-CM | POA: Diagnosis not present

## 2023-05-18 ENCOUNTER — Encounter: Payer: Self-pay | Admitting: Physician Assistant

## 2023-05-18 ENCOUNTER — Ambulatory Visit: Admitting: Physician Assistant

## 2023-05-18 ENCOUNTER — Other Ambulatory Visit (INDEPENDENT_AMBULATORY_CARE_PROVIDER_SITE_OTHER): Payer: Self-pay

## 2023-05-18 DIAGNOSIS — M79652 Pain in left thigh: Secondary | ICD-10-CM | POA: Diagnosis not present

## 2023-05-18 DIAGNOSIS — S76312A Strain of muscle, fascia and tendon of the posterior muscle group at thigh level, left thigh, initial encounter: Secondary | ICD-10-CM | POA: Insufficient documentation

## 2023-05-18 NOTE — Progress Notes (Signed)
 Office Visit Note   Patient: Helen Swanson           Date of Birth: 1945/02/27           MRN: [id number] Visit Date: 05/18/2023              Requested by: Zilphia Hilt, Charyl Coppersmith, MD [address] [place],  [place] [address] PCP: Zilphia Hilt, Charyl Coppersmith, MD   Assessment & Plan: Visit Diagnoses:  1. Pain in left thigh     Plan: Patient is a pleasant 78 year old woman who is extremely active.  She has had a recent history of pain chronically that goes into her posterior left buttock at the insertion of the proximal hamstring.  She was [place] at the beach going [place] some stairs when she had a significant pull feeling with a lot of pain.  No groin pain no radicular findings.  She is focally tender over the proximal insertion of the hamstring on the left.  She cannot take oral anti-inflammatories because of her heart condition.  We discussed using Tylenol  as well as topical medication.  She has a big trip planned for September in [place].  Will have her work with physical therapy.  She will follow-up in a month if she is no better I could refer her for evaluation for possible shockwave treatment with Dr. Vaughn Georges  Follow-Up Instructions: No follow-ups on file.   Orders:  Orders Placed This Encounter  Procedures   XR Pelvis 1-2 Views   Ambulatory referral to Physical Therapy   No orders of the defined types were placed in this encounter.     Procedures: No procedures performed   Clinical Data: No additional findings.   Subjective: No chief complaint on file.   HPI patient is a pleasant 78 year old woman who is extremely active enjoys traveling.  She presents today with some ongoing left posterior buttock pain.  It is in her lower buttock at the attachment of the proximal hamstring.  She denies any numbing tingling or distal radiation of pain except a little down the back of her leg.  Climbing up stairs is especially painful and she had an acute pain when she was  climbing the stairs to her condo.  No evidence of fall  Review of Systems  All other systems reviewed and are negative.    Objective: Vital Signs: LMP 01/02/1989   Physical Exam Constitutional:      Appearance: Normal appearance.  Pulmonary:     Effort: Pulmonary effort is normal.  Skin:    General: Skin is warm and dry.  Neurological:     General: No focal deficit present.     Mental Status: She is alert and oriented to person, place, and time.  Psychiatric:        Mood and Affect: Mood normal.        Behavior: Behavior normal.     Ortho Exam Examination she has no ecchymosis no bruising she has no pain with manipulation of her hip she has good strength with dorsiflexion plantarflexion extension flexion of her legs.  No tenderness in her lower back.  She is extremely painful with stretching of her leg upward and flex sitting.  Pain is focal at the proximal hamstring at the ischial tuberosity Specialty Comments:  No specialty comments available.  Imaging: XR Pelvis 1-2 Views Result Date: 05/18/2023 Radiographs of her pelvis no acute fracture no significant degenerative changes    PMFS History: Patient Active Problem List  Diagnosis Date Noted   Hamstring strain, left, initial encounter 05/18/2023   Right bundle branch block 05/17/2021   Intractable migraine without aura and without status migrainosus 08/20/2014   Benign head tremor 08/20/2014   NSTEMI (non-ST elevated myocardial infarction) (HCC)    Emphysema lung (HCC) 11/26/2013   Stress-induced cardiomyopathy 11/25/2013   Elevated LFTs 11/25/2013   Hyperlipidemia 11/25/2013   History of mitral valve repair 11/20/2012   Heart murmur    Mitral regurgitation    Hypotension    Osteoporosis    Tachycardia    Past Medical History:  Diagnosis Date   Emphysema of lung (HCC)    a. By CXR, nonsmoker.   Heart murmur    Hyperlipidemia    Hypotension    IC (interstitial cystitis)    Migraine    a. New onset  11/2013.   Mitral regurgitation    a. s/p MV repair 2012.   MVP (mitral valve prolapse)    a. s/p MV repair 2012.   Osteopenia    Osteoporosis    Pancreatitis    a. At time of MV repair.   PAT (paroxysmal atrial tachycardia) (HCC)    a. Brief during 11/2013 hospitalization.   Pre-diabetes    Shingles    Tachycardia    Takotsubo cardiomyopathy    a. 11/2013: Stress cardiomyopathy (NICM) - EF 25% by cath, 15% by echo, elevated troponin felt due to this. Normal coronaries by cath 11/25/13.   Transaminitis    Tremor     Family History  Problem Relation Age of Onset   Diabetes Mother    Migraines Mother    CVA Father        mild MI   Hiatal hernia Father    Breast cancer Neg Hx     Past Surgical History:  Procedure Laterality Date   DILATION AND CURETTAGE OF UTERUS     x2   ELBOW SURGERY     right elbow for tendonitis   LEFT HEART CATHETERIZATION WITH CORONARY ANGIOGRAM N/A 11/25/2013   Procedure: LEFT HEART CATHETERIZATION WITH CORONARY ANGIOGRAM;  Surgeon: Mickiel Albany, MD;  Location: [facility] CATH LAB;  Service: Cardiovascular;  Laterality: N/A;   MITRAL VALVE REPAIR  1/12   Dr. Alva Jewels   SHOULDER SURGERY  5/04   right   TOE SURGERY     TOTAL ABDOMINAL HYSTERECTOMY W/ BILATERAL SALPINGOOPHORECTOMY     WRIST SURGERY     left 10/06, right 7/07   Social History   Occupational History   Not on file  Tobacco Use   Smoking status: Never   Smokeless tobacco: Never  Vaping Use   Vaping status: Never Used  Substance and Sexual Activity   Alcohol use: No   Drug use: No   Sexual activity: Not Currently    Birth control/protection: Surgical    Comment: TAH/BSO

## 2023-05-29 ENCOUNTER — Ambulatory Visit: Payer: Medicare Other | Attending: Cardiology | Admitting: Cardiology

## 2023-05-29 ENCOUNTER — Encounter: Payer: Self-pay | Admitting: Cardiology

## 2023-05-29 VITALS — BP 128/80 | HR 85 | Ht 60.5 in | Wt 108.0 lb

## 2023-05-29 DIAGNOSIS — E782 Mixed hyperlipidemia: Secondary | ICD-10-CM | POA: Diagnosis not present

## 2023-05-29 DIAGNOSIS — I5181 Takotsubo syndrome: Secondary | ICD-10-CM

## 2023-05-29 DIAGNOSIS — Z9889 Other specified postprocedural states: Secondary | ICD-10-CM

## 2023-05-29 DIAGNOSIS — I4510 Unspecified right bundle-branch block: Secondary | ICD-10-CM | POA: Diagnosis not present

## 2023-05-29 MED ORDER — METOPROLOL SUCCINATE ER 25 MG PO TB24: 25.0000 mg | ORAL_TABLET | Freq: Every day | ORAL | 3 refills | Status: AC

## 2023-05-29 NOTE — Progress Notes (Signed)
 Cardiology Office Note:  .   Date:  05/29/2023  ID:  Tsosie Gail, DOB [birth year], MRN [id number] PCP: Zilphia Hilt, Charyl Coppersmith, MD  [facility] Providers Cardiologist:  Dorothye Gathers, MD    History of Present Illness: Helen Swanson   Helen Swanson is a 78 y.o. female Discussed the use of AI scribe software for clinical note transcription with the patient, who gave verbal consent to proceed.  History of Present Illness Helen Swanson "[name]" is a 78 year old female who presents for a follow-up visit after mitral valve repair.  She underwent a mitral valve repair in 2015, and a 2022 echocardiogram confirmed the repair was intact with trivial mitral regurgitation. She experienced a Takotsubo-like cardiomyopathy with an ejection fraction of 15% that resolved around a migraine-type event. Brief paroxysmal atrial tachycardia was noted during her 2015 hospitalization. Currently, she has no chest pain or shortness of breath and is taking aspirin  81 mg daily, metoprolol  succinate 25 mg daily, and fish oil.  She has a history of right bundle branch block noted on previous ECGs, and left axis deviation was noted in the past but not on the current EKG.  She enjoys traveling, particularly on [address], and has plans for a two-week river cruise in [place] with her friend Franky Ivanoff. She is concerned about a pulled hamstring that occurred while climbing stairs, which she describes as a 'pop' sensation in her buttock area. She lives alone with her dog and is active, but the hamstring pain affects her mobility, especially with an upcoming trip to [place].      ROS: No CP, no SOB  Studies Reviewed: Helen Swanson    EKG Interpretation Date/Time:  Tuesday May 29 2023 08:29:14 EDT Ventricular Rate:  87 PR Interval:  172 QRS Duration:  100 QT Interval:  348 QTC Calculation: 418 R Axis:   68  Text Interpretation: Normal sinus rhythm Incomplete right bundle branch block Nonspecific T wave abnormality When  compared with ECG of 27-Nov-2013 05:21, Left axis deviation is not present Confirmed by Dorothye Gathers ([id number]) on 05/29/2023 8:36:18 AM    Results LABS LDL: 101 mg/dL (1914)  DIAGNOSTIC Echocardiogram: Mitral valve repair intact with trivial mitral regurgitation (2022)  Risk Assessment/Calculations:            Physical Exam:   VS:  BP 128/80   Pulse 85   Ht 5' 0.5" (1.537 m)   Wt 108 lb (49 kg)   LMP 01/02/1989   SpO2 97%   BMI 20.75 kg/m    Wt Readings from Last 3 Encounters:  05/29/23 108 lb (49 kg)  12/13/22 107 lb 3.2 oz (48.6 kg)  05/19/22 106 lb 6.4 oz (48.3 kg)    GEN: Well nourished, well developed in no acute distress NECK: No JVD; No carotid bruits CARDIAC: RRR, no murmurs, no rubs, no gallops RESPIRATORY:  Clear to auscultation without rales, wheezing or rhonchi  ABDOMEN: Soft, non-tender, non-distended EXTREMITIES:  No edema; No deformity   ASSESSMENT AND PLAN: .    Assessment and Plan Assessment & Plan Mitral valve repair with trivial regurgitation Mitral valve repair from 2015 remains intact with trivial mitral regurgitation as per 2022 echocardiogram. She reports no symptoms.  Paroxysmal atrial tachycardia Brief paroxysmal atrial tachycardia occurred during 2015 hospitalization. She reports no current episodes. Metoprolol  XL 25mg  once a day  Takotsubo cardiomyopathy Takotsubo-like cardiomyopathy with an EF of 15% resolved following a migraine-type event. No recurrence reported.  Right bundle branch block Right bundle  branch block identified on previous ECG. She reports no new symptoms.  Left axis deviation Left axis deviation noted on past ECGs but absent on current EKG. She reports no new symptoms.  Tremors Tremors present historically. She reports no exacerbation or new symptoms.         Dispo: 1 yr  Signed, Dorothye Gathers, MD

## 2023-05-29 NOTE — Patient Instructions (Signed)

## 2023-06-01 ENCOUNTER — Ambulatory Visit: Payer: Self-pay | Admitting: Physical Therapy

## 2023-06-01 ENCOUNTER — Other Ambulatory Visit: Payer: Self-pay

## 2023-06-01 ENCOUNTER — Encounter: Payer: Self-pay | Admitting: Physical Therapy

## 2023-06-01 DIAGNOSIS — M79605 Pain in left leg: Secondary | ICD-10-CM | POA: Diagnosis not present

## 2023-06-01 DIAGNOSIS — M79604 Pain in right leg: Secondary | ICD-10-CM

## 2023-06-01 DIAGNOSIS — M6281 Muscle weakness (generalized): Secondary | ICD-10-CM

## 2023-06-01 DIAGNOSIS — R29898 Other symptoms and signs involving the musculoskeletal system: Secondary | ICD-10-CM | POA: Diagnosis not present

## 2023-06-01 NOTE — Therapy (Signed)
 OUTPATIENT PHYSICAL THERAPY LOWER EXTREMITY EVALUATION   Patient Name: Helen Swanson MRN: [id number] DOB:1945/06/10, 78 y.o., female Today's Date: 06/01/2023  END OF SESSION:  PT End of Session - 06/01/23 1119     Visit Number 1    Number of Visits 17    Date for PT Re-Evaluation 07/27/23    Authorization Type [employer] MCR    Authorization Time Period 06/01/23 to 07/27/23    Progress Note Due on Visit 10    PT Start Time 1101    PT Stop Time 1142    PT Time Calculation (min) 41 min    Activity Tolerance Patient tolerated treatment well    Behavior During Therapy [facility] for tasks assessed/performed             Past Medical History:  Diagnosis Date   Emphysema of lung (HCC)    a. By CXR, nonsmoker.   Heart murmur    Hyperlipidemia    Hypotension    IC (interstitial cystitis)    Migraine    a. New onset 11/2013.   Mitral regurgitation    a. s/p MV repair 2012.   MVP (mitral valve prolapse)    a. s/p MV repair 2012.   Osteopenia    Osteoporosis    Pancreatitis    a. At time of MV repair.   PAT (paroxysmal atrial tachycardia) (HCC)    a. Brief during 11/2013 hospitalization.   Pre-diabetes    Shingles    Tachycardia    Takotsubo cardiomyopathy    a. 11/2013: Stress cardiomyopathy (NICM) - EF 25% by cath, 15% by echo, elevated troponin felt due to this. Normal coronaries by cath 11/25/13.   Transaminitis    Tremor    Past Surgical History:  Procedure Laterality Date   DILATION AND CURETTAGE OF UTERUS     x2   ELBOW SURGERY     right elbow for tendonitis   LEFT HEART CATHETERIZATION WITH CORONARY ANGIOGRAM N/A 11/25/2013   Procedure: LEFT HEART CATHETERIZATION WITH CORONARY ANGIOGRAM;  Surgeon: Mickiel Albany, MD;  Location[facility] CATH LAB;  Service: Cardiovascular;  Laterality: N/A;   MITRAL VALVE REPAIR  1/12   Dr. Alva Jewels   SHOULDER SURGERY  5/04   right   TOE SURGERY     TOTAL ABDOMINAL HYSTERECTOMY W/ BILATERAL SALPINGOOPHORECTOMY     WRIST SURGERY      left 10/06, right 7/07   Patient Active Problem List   Diagnosis Date Noted   Hamstring strain, left, initial encounter 05/18/2023   Right bundle branch block 05/17/2021   Intractable migraine without aura and without status migrainosus 08/20/2014   Benign head tremor 08/20/2014   NSTEMI (non-ST elevated myocardial infarction) (HCC)    Emphysema lung (HCC) 11/26/2013   Stress-induced cardiomyopathy 11/25/2013   Elevated LFTs 11/25/2013   Hyperlipidemia 11/25/2013   History of mitral valve repair 11/20/2012   Heart murmur    Mitral regurgitation    Hypotension    Osteoporosis    Tachycardia     PCP: Zilphia Hilt, Geary Kells MD   REFERRING PROVIDER: Persons, Norma Beckers, PA  REFERRING DIAG:  Diagnosis  [phone] (ICD-10-CM) - Pain in left thigh    THERAPY DIAG:  Pain in left leg  Pain in right leg  Muscle weakness (generalized)  Other symptoms and signs involving the musculoskeletal system  Rationale for Evaluation and Treatment: Rehabilitation  ONSET DATE: mid-April 2025  SUBJECTIVE:   SUBJECTIVE STATEMENT: This started hurting about mid-April 2025, the steps at  the beach really set it off badly. Was having a hard time sitting in the car to even ride to the beach, but going up the stairs at the beach condo caused the back of that leg to catch. If I hadn't been holding on on the steps, I would have fallen. The only time it doesn't hurt is when I'm in bed.   PERTINENT HISTORY: See above  PAIN:  Are you having pain? Yes: NPRS scale: 7-8/10 Pain location: L HS  Pain description: aching  Aggravating factors: extended sitting, most activities  Relieving factors: laying with leg straight   PRECAUTIONS: None  RED FLAGS: None   WEIGHT BEARING RESTRICTIONS: No  FALLS:  Has patient fallen in last 6 months? No  LIVING ENVIRONMENT: Lives with: lives alone Lives in: House/apartment Stairs: No Has following equipment at home: None  OCCUPATION: retired- used to  Associate Professor, also worked as a travel agency   PLOF: Independent, Independent with basic ADLs, Independent with gait, and Independent with transfers  PATIENT GOALS: address pain, be able to walk without hurting   NEXT MD VISIT:  Referring June 17th 2025  OBJECTIVE:  Note: Objective measures were completed at Evaluation unless otherwise noted.  DIAGNOSTIC FINDINGS:   Radiographs of her pelvis no acute fracture no significant degenerative  changes  PATIENT SURVEYS:    Patient-Specific Activity Scoring Scheme  "0" represents "unable to perform." "10" represents "able to perform at prior level. 0 1 2 3 4 5 6 7 8 9  10 (Date and Score)   Activity Eval     1. Walking   8    2. Steps   8    3. Sitting  8   4.Bending over  8   5.    Score 8 "I can do everything but it just hurts"     Total score = sum of the activity scores/number of activities Minimum detectable change (90%CI) for average score = 2 points Minimum detectable change (90%CI) for single activity score = 3 points     COGNITION: Overall cognitive status: Within functional limits for tasks assessed       MUSCLE LENGTH:  Hip flexors WNL B Quads mod limitation B HS severe limitation B Piriformis WNL B     PALPATION:  Tender at ischial tub L hip, otherwise HS not tender to palpation and no mm spasms noted, R anterior quad not tender to palpation but does relay some pain with active motions     LOWER EXTREMITY MMT:  MMT Right eval Left eval  Hip flexion 3- limited by chronic R quad pain  3- limited by HS pain   Hip extension 3 2+  Hip abduction 3+ 3- HS pain   Hip adduction    Hip internal rotation    Hip external rotation    Knee flexion 3+ at best 3 at best painful   Knee extension 4+ 4  Ankle dorsiflexion    Ankle plantarflexion    Ankle inversion    Ankle eversion     (Blank rows = not tested)    FUNCTIONAL TESTS:  5 times sit to stand: 18.7 seconds no UEs, difficulty with eccentric control  and some posterior unsteadiness noted  TREATMENT DATE:   06/01/23  Eval, POC, HEP    Bridges + ABD into red TB x10  Sidelying clams x10 L (attempted with red TB, painful) Gentle seated HS stretch demo and trial B  Nustep L3 all four extremities x8 min   PATIENT EDUCATION:  Education details: exam findings, POC, HEP  Person educated: Patient Education method: Programmer, multimedia, Demonstration, and Handouts Education comprehension: verbalized understanding, returned demonstration, and needs further education  HOME EXERCISE PROGRAM:  Access Code: [email or web address] URL: [email or web address] Date: 06/01/2023 Prepared by: Terrel Ferries  Exercises - Supine Bridge with Resistance Band  - 1 x daily - 7 x weekly - 1-2 sets - 10 reps - 1 second  hold - Clamshell  - 1 x daily - 7 x weekly - 1-2 sets - 10 reps - 1 second  hold - Seated Hamstring Stretch  - 1 x daily - 7 x weekly - 2 sets - 3 reps - 30 seconds  hold  ASSESSMENT:  CLINICAL IMPRESSION: Patient is a 78 y.o. F who was seen today for physical therapy evaluation and treatment for  Diagnosis  M79.652 (ICD-10-CM) - Pain in left thigh  . Objectives as above. She does have some chronic issues with R quad, I suspect that this specific injury may have changed some biomechanics/walking pattern and potentially have contributed to high HS injury on the left. Will make every effort to improve her pain and assist in return to PLOF, especially as she has a significant trip coming up in September.   OBJECTIVE IMPAIRMENTS: Abnormal gait, decreased activity tolerance, decreased balance, decreased mobility, difficulty walking, decreased ROM, decreased strength, increased fascial restrictions, increased muscle spasms, impaired flexibility, and pain.   ACTIVITY LIMITATIONS: sitting, standing, squatting, stairs,  transfers, bed mobility, and locomotion level  PARTICIPATION LIMITATIONS: driving, shopping, community activity, occupation, and yard work  PERSONAL FACTORS: Age, Behavior pattern, Education, Fitness, Past/current experiences, Profession, and Time since onset of injury/illness/exacerbation are also affecting patient's functional outcome.   REHAB POTENTIAL: Good  CLINICAL DECISION MAKING: Stable/uncomplicated  EVALUATION COMPLEXITY: Low   GOALS: Goals reviewed with patient? Yes  SHORT TERM GOALS: Target date: 06/29/2023    Will be compliant with appropriate progressive HEP  Baseline: Goal status: INITIAL  2.  B HS flexibility to have returned to baseline  Baseline:  Goal status: INITIAL  3.  Pain to be no more than 3/10 at worst with functional tasks  Baseline:  Goal status: INITIAL    LONG TERM GOALS: Target date: 07/27/2023    MMT to have improved by one grade in all weak groups  Baseline:  Goal status: INITIAL  2.  Pain to have resolved in L HS and R quad  Baseline:  Goal status: INITIAL  3.  Will be able to participate in all desired leisure and sports activities without increase in pain  Baseline:  Goal status: INITIAL  4.  PSFS to have improved by at least 3 points  Baseline:  Goal status: INITIAL  5.  Will be able to ambulate community distances and navigate steps as desired without increase in pain R quad and L HS  Baseline:  Goal status: INITIAL     PLAN:  PT FREQUENCY: 2x/week  PT DURATION: 8 weeks  PLANNED INTERVENTIONS: 97750- Physical Performance Testing, 97110-Therapeutic exercises, 97530- Therapeutic activity, [id number]- Neuromuscular re-education, 97535- Self Care, [id number]- Manual therapy, [id number]- Gait training, [id number]- Aquatic Therapy, [id number]- Vasopneumatic device, Taping, and Dry Needling  PLAN FOR NEXT SESSION: functional strengthening, manual or even DN  as appropriate for HS and quad, work on HS flexibility and sciatic nerve mobility    Terrel Ferries, PT, DPT 06/01/23 11:45 AM

## 2023-06-08 ENCOUNTER — Ambulatory Visit: Admitting: Physical Therapy

## 2023-06-08 ENCOUNTER — Encounter: Payer: Self-pay | Admitting: Physical Therapy

## 2023-06-08 DIAGNOSIS — M79605 Pain in left leg: Secondary | ICD-10-CM

## 2023-06-08 DIAGNOSIS — M79604 Pain in right leg: Secondary | ICD-10-CM

## 2023-06-08 DIAGNOSIS — R29898 Other symptoms and signs involving the musculoskeletal system: Secondary | ICD-10-CM | POA: Diagnosis not present

## 2023-06-08 DIAGNOSIS — M6281 Muscle weakness (generalized): Secondary | ICD-10-CM

## 2023-06-08 NOTE — Therapy (Signed)
 OUTPATIENT PHYSICAL THERAPY LOWER EXTREMITY TREATMENT    Patient Name: Helen Swanson MRN: [id number] DOB:1945-10-27, 78 y.o., female Today's Date: 06/08/2023  END OF SESSION:  PT End of Session - 06/08/23 0945     Visit Number 2    Number of Visits 17    Date for PT Re-Evaluation 07/27/23    Authorization Type BCBS MCR    Authorization Time Period 06/01/23 to 07/27/23    Progress Note Due on Visit 10    PT Start Time 0932    PT Stop Time 1011    PT Time Calculation (min) 39 min    Activity Tolerance Patient tolerated treatment well    Behavior During Therapy [facility] for tasks assessed/performed              Past Medical History:  Diagnosis Date   Emphysema of lung (HCC)    a. By CXR, nonsmoker.   Heart murmur    Hyperlipidemia    Hypotension    IC (interstitial cystitis)    Migraine    a. New onset 11/2013.   Mitral regurgitation    a. s/p MV repair 2012.   MVP (mitral valve prolapse)    a. s/p MV repair 2012.   Osteopenia    Osteoporosis    Pancreatitis    a. At time of MV repair.   PAT (paroxysmal atrial tachycardia) (HCC)    a. Brief during 11/2013 hospitalization.   Pre-diabetes    Shingles    Tachycardia    Takotsubo cardiomyopathy    a. 11/2013: Stress cardiomyopathy (NICM) - EF 25% by cath, 15% by echo, elevated troponin felt due to this. Normal coronaries by cath 11/25/13.   Transaminitis    Tremor    Past Surgical History:  Procedure Laterality Date   DILATION AND CURETTAGE OF UTERUS     x2   ELBOW SURGERY     right elbow for tendonitis   LEFT HEART CATHETERIZATION WITH CORONARY ANGIOGRAM N/A 11/25/2013   Procedure: LEFT HEART CATHETERIZATION WITH CORONARY ANGIOGRAM;  Surgeon: Mickiel Albany, MD;  Location: [facility] CATH LAB;  Service: Cardiovascular;  Laterality: N/A;   MITRAL VALVE REPAIR  1/12   Dr. Alva Jewels   SHOULDER SURGERY  5/04   right   TOE SURGERY     TOTAL ABDOMINAL HYSTERECTOMY W/ BILATERAL SALPINGOOPHORECTOMY     WRIST SURGERY      left 10/06, right 7/07   Patient Active Problem List   Diagnosis Date Noted   Hamstring strain, left, initial encounter 05/18/2023   Right bundle branch block 05/17/2021   Intractable migraine without aura and without status migrainosus 08/20/2014   Benign head tremor 08/20/2014   NSTEMI (non-ST elevated myocardial infarction) (HCC)    Emphysema lung (HCC) 11/26/2013   Stress-induced cardiomyopathy 11/25/2013   Elevated LFTs 11/25/2013   Hyperlipidemia 11/25/2013   History of mitral valve repair 11/20/2012   Heart murmur    Mitral regurgitation    Hypotension    Osteoporosis    Tachycardia     PCP: Zilphia Hilt, Geary Kells MD   REFERRING PROVIDER: Persons, Norma Beckers, PA  REFERRING DIAG:  Diagnosis  [phone] (ICD-10-CM) - Pain in left thigh    THERAPY DIAG:  Pain in left leg  Pain in right leg  Muscle weakness (generalized)  Other symptoms and signs involving the musculoskeletal system  Rationale for Evaluation and Treatment: Rehabilitation  ONSET DATE: mid-April 2025  SUBJECTIVE:   SUBJECTIVE STATEMENT:   Nothing has changed yet but  I keep telling myself that I've only been seen one time, everyone keeps asking me if PT has worked yet.    EVAL: This started hurting about mid-April 2025, the steps at the beach really set it off badly. Was having a hard time sitting in the car to even ride to the beach, but going up the stairs at the beach condo caused the back of that leg to catch. If I hadn't been holding on on the steps, I would have fallen. The only time it doesn't hurt is when I'm in bed.   PERTINENT HISTORY: See above  PAIN:  Are you having pain? Yes: NPRS scale: 8-9/10 "because I did a LOT yesterday" Pain location: L HS high up Pain description: aching  Aggravating factors: extended sitting, most activities, doing too much   Relieving factors: laying with leg straight   PRECAUTIONS: None  RED FLAGS: None   WEIGHT BEARING RESTRICTIONS: No  FALLS:   Has patient fallen in last 6 months? No  LIVING ENVIRONMENT: Lives with: lives alone Lives in: House/apartment Stairs: No Has following equipment at home: None  OCCUPATION: retired- used to Associate Professor, also worked as a travel agency   PLOF: Independent, Independent with basic ADLs, Independent with gait, and Independent with transfers  PATIENT GOALS: address pain, be able to walk without hurting   NEXT MD VISIT:  Referring June 17th 2025  OBJECTIVE:  Note: Objective measures were completed at Evaluation unless otherwise noted.  DIAGNOSTIC FINDINGS:   Radiographs of her pelvis no acute fracture no significant degenerative  changes  PATIENT SURVEYS:    Patient-Specific Activity Scoring Scheme  "0" represents "unable to perform." "10" represents "able to perform at prior level. 0 1 2 3 4 5 6 7 8 9  10 (Date and Score)   Activity Eval     1. Walking   8    2. Steps   8    3. Sitting  8   4.Bending over  8   5.    Score 8 "I can do everything but it just hurts"     Total score = sum of the activity scores/number of activities Minimum detectable change (90%CI) for average score = 2 points Minimum detectable change (90%CI) for single activity score = 3 points     COGNITION: Overall cognitive status: Within functional limits for tasks assessed       MUSCLE LENGTH:  Hip flexors WNL B Quads mod limitation B HS severe limitation B; 06/08/23-  moderate limitation B but very guarded  Piriformis WNL B     PALPATION:  Tender at ischial tub L hip, otherwise HS not tender to palpation and no mm spasms noted, R anterior quad not tender to palpation but does relay some pain with active motions     LOWER EXTREMITY MMT:  MMT Right eval Left eval  Hip flexion 3- limited by chronic R quad pain  3- limited by HS pain   Hip extension 3 2+  Hip abduction 3+ 3- HS pain   Hip adduction    Hip internal rotation    Hip external rotation    Knee flexion 3+ at best 3 at  best painful   Knee extension 4+ 4  Ankle dorsiflexion    Ankle plantarflexion    Ankle inversion    Ankle eversion     (Blank rows = not tested)    FUNCTIONAL TESTS:  5 times sit to stand: 18.7 seconds no UEs, difficulty with eccentric control and  some posterior unsteadiness noted                                                                                                                                   TREATMENT DATE:    06/08/23  Scifit bike L4x8 minutes all four extremities seat 4  Checked HS length Staggered bridges x10 B Sidelying hip ABD x10 B 0# L side much more difficult  Single leg bridges x5 B Thomas stretch 2x30 seconds B  SLRs 0# x10 B limited ROM R LE  Shuttle LE press 50# x10 Hip hikes x12 B     06/01/23  Eval, POC, HEP    Bridges + ABD into red TB x10  Sidelying clams x10 L (attempted with red TB, painful) Gentle seated HS stretch demo and trial B  Nustep L3 all four extremities x8 min   PATIENT EDUCATION:  Education details: exam findings, POC, HEP  Person educated: Patient Education method: Programmer, multimedia, Demonstration, and Handouts Education comprehension: verbalized understanding, returned demonstration, and needs further education  HOME EXERCISE PROGRAM:  Access Code: [email or web address] URL: [email or web address] Date: 06/01/2023 Prepared by: Terrel Ferries  Exercises - Supine Bridge with Resistance Band  - 1 x daily - 7 x weekly - 1-2 sets - 10 reps - 1 second  hold - Clamshell  - 1 x daily - 7 x weekly - 1-2 sets - 10 reps - 1 second  hold - Seated Hamstring Stretch  - 1 x daily - 7 x weekly - 2 sets - 3 reps - 30 seconds  hold  ASSESSMENT:  CLINICAL IMPRESSION:  Arrives today reporting no significant changes, assured her that it is still quite early in POC and HS injuries, especially high ones can be a bit stubborn. Checked HS length again, otherwise worked on functional strengthening and flexibility as per POC. Will  continue to progress as able.     EVAL: Patient is a 77 y.o. F who was seen today for physical therapy evaluation and treatment for  Diagnosis  M79.652 (ICD-10-CM) - Pain in left thigh  . Objectives as above. She does have some chronic issues with R quad, I suspect that this specific injury may have changed some biomechanics/walking pattern and potentially have contributed to high HS injury on the left. Will make every effort to improve her pain and assist in return to PLOF, especially as she has a significant trip coming up in September.   OBJECTIVE IMPAIRMENTS: Abnormal gait, decreased activity tolerance, decreased balance, decreased mobility, difficulty walking, decreased ROM, decreased strength, increased fascial restrictions, increased muscle spasms, impaired flexibility, and pain.   ACTIVITY LIMITATIONS: sitting, standing, squatting, stairs, transfers, bed mobility, and locomotion level  PARTICIPATION LIMITATIONS: driving, shopping, community activity, occupation, and yard work  PERSONAL FACTORS: Age, Behavior pattern, Education, Fitness, Past/current experiences, Profession, and Time since onset of injury/illness/exacerbation are also affecting patient's functional outcome.   REHAB POTENTIAL: Good  CLINICAL DECISION MAKING: Stable/uncomplicated  EVALUATION COMPLEXITY: Low   GOALS: Goals reviewed with patient? Yes  SHORT TERM GOALS: Target date: 06/29/2023    Will be compliant with appropriate progressive HEP  Baseline: Goal status: INITIAL  2.  B HS flexibility to have returned to baseline  Baseline:  Goal status: ONGOING 06/08/23  3.  Pain to be no more than 3/10 at worst with functional tasks  Baseline:  Goal status: INITIAL    LONG TERM GOALS: Target date: 07/27/2023    MMT to have improved by one grade in all weak groups  Baseline:  Goal status: INITIAL  2.  Pain to have resolved in L HS and R quad  Baseline:  Goal status: INITIAL  3.  Will be able to  participate in all desired leisure and sports activities without increase in pain  Baseline:  Goal status: INITIAL  4.  PSFS to have improved by at least 3 points  Baseline:  Goal status: INITIAL  5.  Will be able to ambulate community distances and navigate steps as desired without increase in pain R quad and L HS  Baseline:  Goal status: INITIAL     PLAN:  PT FREQUENCY: 2x/week  PT DURATION: 8 weeks  PLANNED INTERVENTIONS: 97750- Physical Performance Testing, 97110-Therapeutic exercises, 97530- Therapeutic activity, [id number]- Neuromuscular re-education, 97535- Self Care, 16109- Manual therapy, [id number]- Gait training, [phone]- Aquatic Therapy, 97016- Vasopneumatic device, Taping, and Dry Needling  PLAN FOR NEXT SESSION: functional strengthening, manual or even DN as appropriate for HS and quad, work on HS flexibility and sciatic nerve mobility, HEP updates PRN and could try percussion gun to quad if desired   Terrel Ferries, PT, DPT 06/08/23 10:11 AM

## 2023-06-12 ENCOUNTER — Ambulatory Visit: Admitting: Physical Therapy

## 2023-06-12 ENCOUNTER — Encounter: Payer: Self-pay | Admitting: Physical Therapy

## 2023-06-12 DIAGNOSIS — M6281 Muscle weakness (generalized): Secondary | ICD-10-CM

## 2023-06-12 DIAGNOSIS — M79604 Pain in right leg: Secondary | ICD-10-CM

## 2023-06-12 DIAGNOSIS — H43393 Other vitreous opacities, bilateral: Secondary | ICD-10-CM | POA: Diagnosis not present

## 2023-06-12 DIAGNOSIS — M79605 Pain in left leg: Secondary | ICD-10-CM

## 2023-06-12 DIAGNOSIS — R29898 Other symptoms and signs involving the musculoskeletal system: Secondary | ICD-10-CM | POA: Diagnosis not present

## 2023-06-12 NOTE — Therapy (Signed)
 OUTPATIENT PHYSICAL THERAPY LOWER EXTREMITY TREATMENT    Patient Name: Helen Swanson MRN: [id number] DOB:March 28, 1945, 78 y.o., female Today's Date: 06/12/2023  END OF SESSION:  PT End of Session - 06/12/23 1259     Visit Number 3    Number of Visits 17    Date for PT Re-Evaluation 07/27/23    Authorization Type BCBS MCR    Authorization Time Period 06/01/23 to 07/27/23    Progress Note Due on Visit 10    PT Start Time 1259    PT Stop Time 1344    PT Time Calculation (min) 45 min    Activity Tolerance Patient tolerated treatment well    Behavior During Therapy [facility] for tasks assessed/performed               Past Medical History:  Diagnosis Date   Emphysema of lung (HCC)    a. By CXR, nonsmoker.   Heart murmur    Hyperlipidemia    Hypotension    IC (interstitial cystitis)    Migraine    a. New onset 11/2013.   Mitral regurgitation    a. s/p MV repair 2012.   MVP (mitral valve prolapse)    a. s/p MV repair 2012.   Osteopenia    Osteoporosis    Pancreatitis    a. At time of MV repair.   PAT (paroxysmal atrial tachycardia) (HCC)    a. Brief during 11/2013 hospitalization.   Pre-diabetes    Shingles    Tachycardia    Takotsubo cardiomyopathy    a. 11/2013: Stress cardiomyopathy (NICM) - EF 25% by cath, 15% by echo, elevated troponin felt due to this. Normal coronaries by cath 11/25/13.   Transaminitis    Tremor    Past Surgical History:  Procedure Laterality Date   DILATION AND CURETTAGE OF UTERUS     x2   ELBOW SURGERY     right elbow for tendonitis   LEFT HEART CATHETERIZATION WITH CORONARY ANGIOGRAM N/A 11/25/2013   Procedure: LEFT HEART CATHETERIZATION WITH CORONARY ANGIOGRAM;  Surgeon: Mickiel Albany, MD;  Location: [facility] CATH LAB;  Service: Cardiovascular;  Laterality: N/A;   MITRAL VALVE REPAIR  1/12   Dr. Alva Jewels   SHOULDER SURGERY  5/04   right   TOE SURGERY     TOTAL ABDOMINAL HYSTERECTOMY W/ BILATERAL SALPINGOOPHORECTOMY     WRIST SURGERY      left 10/06, right 7/07   Patient Active Problem List   Diagnosis Date Noted   Hamstring strain, left, initial encounter 05/18/2023   Right bundle branch block 05/17/2021   Intractable migraine without aura and without status migrainosus 08/20/2014   Benign head tremor 08/20/2014   NSTEMI (non-ST elevated myocardial infarction) (HCC)    Emphysema lung (HCC) 11/26/2013   Stress-induced cardiomyopathy 11/25/2013   Elevated LFTs 11/25/2013   Hyperlipidemia 11/25/2013   History of mitral valve repair 11/20/2012   Heart murmur    Mitral regurgitation    Hypotension    Osteoporosis    Tachycardia     PCP: Zilphia Hilt, Geary Kells MD   REFERRING PROVIDER: Persons, Norma Beckers, PA  REFERRING DIAG:  Diagnosis  [phone] (ICD-10-CM) - Pain in left thigh    THERAPY DIAG:  Pain in left leg  Pain in right leg  Muscle weakness (generalized)  Other symptoms and signs involving the musculoskeletal system  Rationale for Evaluation and Treatment: Rehabilitation  ONSET DATE: mid-April 2025  SUBJECTIVE:   SUBJECTIVE STATEMENT: She has been doing her exercises  1-2x/day.  She is noticing less hamstring pain walking this morning. The right quad pain is no difference.   EVAL: This started hurting about mid-April 2025, the steps at the beach really set it off badly. Was having a hard time sitting in the car to even ride to the beach, but going up the stairs at the beach condo caused the back of that leg to catch. If I hadn't been holding on on the steps, I would have fallen. The only time it doesn't hurt is when I'm in bed.   PERTINENT HISTORY: See above  PAIN:  Are you having pain? Yes: NPRS scale:  at rest 0-1/10 and with movements / walking - left hamstring up to 5/10 & right quad 3-4/10 Pain location: L HS high up, right quad  Pain description: aching  Aggravating factors: left hamstring extended sitting sore then spikes when she gets up;  walking esp when reaching LLE forward,  stairs    Right quad more vastus medialus & rectus: driving moving gas to break.  Getting into high bed lifting RLE Relieving factors: laying with leg straight   PRECAUTIONS: None  RED FLAGS: None   WEIGHT BEARING RESTRICTIONS: No  FALLS:  Has patient fallen in last 6 months? No  LIVING ENVIRONMENT: Lives with: lives alone Lives in: House/apartment Stairs: No Has following equipment at home: None  OCCUPATION: retired- used to Associate Professor, also worked as a travel agency   PLOF: Independent, Independent with basic ADLs, Independent with gait, and Independent with transfers  PATIENT GOALS: address pain, be able to walk without hurting   NEXT MD VISIT:  Referring June 17th 2025  OBJECTIVE:  Note: Objective measures were completed at Evaluation unless otherwise noted.  DIAGNOSTIC FINDINGS:   Radiographs of her pelvis no acute fracture no significant degenerative  changes  PATIENT SURVEYS:    Patient-Specific Activity Scoring Scheme  "0" represents "unable to perform." "10" represents "able to perform at prior level. 0 1 2 3 4 5 6 7 8 9  10 (Date and Score)   Activity Eval     1. Walking   8    2. Steps   8    3. Sitting  8   4.Bending over  8   5.    Score 8 "I can do everything but it just hurts"     Total score = sum of the activity scores/number of activities Minimum detectable change (90%CI) for average score = 2 points Minimum detectable change (90%CI) for single activity score = 3 points  COGNITION: Overall cognitive status: Within functional limits for tasks assessed    MUSCLE LENGTH: Hip flexors WNL B Quads mod limitation B HS severe limitation B; 06/08/23-  moderate limitation B but very guarded  Piriformis WNL B   PALPATION:  Tender at ischial tub L hip, otherwise HS not tender to palpation and no mm spasms noted, R anterior quad not tender to palpation but does relay some pain with active motions    LOWER EXTREMITY MMT:  MMT Right eval  Left eval  Hip flexion 3- limited by chronic R quad pain  3- limited by HS pain   Hip extension 3 2+  Hip abduction 3+ 3- HS pain   Hip adduction    Hip internal rotation    Hip external rotation    Knee flexion 3+ at best 3 at best painful   Knee extension 4+ 4  Ankle dorsiflexion    Ankle plantarflexion    Ankle inversion  Ankle eversion     (Blank rows = not tested)    FUNCTIONAL TESTS:  5 times sit to stand: 18.7 seconds no UEs, difficulty with eccentric control and some posterior unsteadiness noted                                                                                                                         TODAY'S TREATMENT NOTE: 06/12/2023 Therapeutic Exercise: Nustep seat 6 level 7 with BLEs & BUEs for 8 min. Seated hamstring stretch 1st rep with strap & 2nd rep with strap DF - 30 sec 2 reps each LE;  supine hamstring stretch with femur stabilize vertically and actively extending her knee up to the point she feels a stretch in the hamstring area hold for 3 deep breaths, 3 reps; patient reported feeling stretch more in the area of her hamstring where she is having pain with the supine stretch. Sidelying clam shell red band BLEs 10 reps BLEs with PT tactile & verbal cues on technique Bridge with red band 10 reps.  PT cued on technique and patient verbalized understanding. Standing quad stretch using towel 3 reps with hold for 3 deep breaths. PT updated HEP with HO, demo, verbal and tactile cues including instruction in proper technique for above exercises.  Patient appears to have a better understanding of how to perform her HEP.  Patient verbalized understanding after completing in clinic.  Self-care: Patient reported pain in her right quad with getting onto her high bed.  Patient demonstrated that she is placing right extremity onto the bed with a flexed, abducted and rotated hip position then trying to weight shift onto her right lower extremity to raise up into  the bed.  This is putting an excessive stretch on the quadriceps in an abnormal position which may be a part of her quadriceps pain.  PT recommended standing and hiking right hip up onto the bed to sit on edge of bed then lying down.  Patient able to return demonstration this technique and verbalized no knee pain with a change in technique.   TREATMENT DATE:  06/08/23 Scifit bike L4x8 minutes all four extremities seat 4  Checked HS length Staggered bridges x10 B Sidelying hip ABD x10 B 0# L side much more difficult  Single leg bridges x5 B Thomas stretch 2x30 seconds B  SLRs 0# x10 B limited ROM R LE  Shuttle LE press 50# x10 Hip hikes x12 B     06/01/23  Eval, POC, HEP    Bridges + ABD into red TB x10  Sidelying clams x10 L (attempted with red TB, painful) Gentle seated HS stretch demo and trial B  Nustep L3 all four extremities x8 min   PATIENT EDUCATION:  Education details: exam findings, POC, HEP  Person educated: Patient Education method: Programmer, multimedia, Demonstration, and Handouts Education comprehension: verbalized understanding, returned demonstration, and needs further education  HOME EXERCISE PROGRAM: Access Code: [email or web address] URL: [email or web address] Date: 06/12/2023 Prepared by: Lorie Rook  Exercises - Supine Bridge with Resistance Band  - 1 x daily - 7 x weekly - 1-2 sets - 10 reps - 1 second  hold - Clam with Resistance  - 1-2 x daily - 7 x weekly - 1-2 sets - 10 reps - 1-2 seconds hold - Seated Hamstring Stretch  - 1 x daily - 7 x weekly - 2 sets - 3 reps - 30 seconds  hold - Hooklying Active Hamstring Stretch  - 1-2 x daily - 7 x weekly - 1 sets - 3 reps - 3 deep breaths hold - Standing Quad Stretch with Towel and Arm Support  - 1-2 x daily - 7 x weekly - 1 sets - 3 reps - 3 deep breaths hold   ASSESSMENT:  CLINICAL IMPRESSION: Patient is reporting decrease in pain in both the left hamstring and right quadriceps.  Patient appears to  understand updated and corrected exercises for HEP.  Patient is responding well to stretching and stabilization exercises for involved muscles.  Exercises should be progressed next session to include active hamstring and quadriceps motions.  EVAL: Patient is a 78 y.o. F who was seen today for physical therapy evaluation and treatment for  Diagnosis  M79.652 (ICD-10-CM) - Pain in left thigh  . Objectives as above. She does have some chronic issues with R quad, I suspect that this specific injury may have changed some biomechanics/walking pattern and potentially have contributed to high HS injury on the left. Will make every effort to improve her pain and assist in return to PLOF, especially as she has a significant trip coming up in September.   OBJECTIVE IMPAIRMENTS: Abnormal gait, decreased activity tolerance, decreased balance, decreased mobility, difficulty walking, decreased ROM, decreased strength, increased fascial restrictions, increased muscle spasms, impaired flexibility, and pain.   ACTIVITY LIMITATIONS: sitting, standing, squatting, stairs, transfers, bed mobility, and locomotion level  PARTICIPATION LIMITATIONS: driving, shopping, community activity, occupation, and yard work  PERSONAL FACTORS: Age, Behavior pattern, Education, Fitness, Past/current experiences, Profession, and Time since onset of injury/illness/exacerbation are also affecting patient's functional outcome.   REHAB POTENTIAL: Good  CLINICAL DECISION MAKING: Stable/uncomplicated  EVALUATION COMPLEXITY: Low   GOALS: Goals reviewed with patient? Yes  SHORT TERM GOALS: Target date: 06/29/2023    Will be compliant with appropriate progressive HEP  Baseline: Goal status: Ongoing   06/12/2023  2.  B HS flexibility to have returned to baseline  Baseline:  Goal status: Ongoing   06/12/2023  3.  Pain to be no more than 3/10 at worst with functional tasks  Baseline:  Goal status: Ongoing   06/12/2023  LONG TERM  GOALS: Target date: 07/27/2023   MMT to have improved by one grade in all weak groups  Baseline:  Goal status: Ongoing   06/12/2023  2.  Pain to have resolved in L HS and R quad  Baseline:  Goal status: Ongoing   06/12/2023  3.  Will be able to participate in all desired leisure and sports activities without increase in pain  Baseline:  Goal status: Ongoing   06/12/2023  4.  PSFS to have improved by at least 3 points  Baseline:  Goal status: Ongoing   06/12/2023  5.  Will be able to ambulate community distances and navigate steps as desired without increase in pain R quad and L HS  Baseline:  Goal status:  Ongoing   06/12/2023     PLAN:  PT FREQUENCY: 2x/week  PT DURATION: 8 weeks  PLANNED INTERVENTIONS: 97750- Physical  Performance Testing, 97110-Therapeutic exercises, 97530- Therapeutic activity, [id number]- Neuromuscular re-education, [id number]- Self Care, 52841- Manual therapy, [id number]- Gait training, [phone]- Aquatic Therapy, [phone]- Vasopneumatic device, Taping, and Dry Needling  PLAN FOR NEXT SESSION: Instruct in active range exercises antigravity for hamstrings and quadriceps,    functional strengthening, manual or even DN as appropriate for HS and quad, work on HS flexibility and sciatic nerve mobility, HEP updates PRN and could try percussion gun to quad if desired    Lorie Rook, PT, DPT 06/12/2023, 5:10 PM

## 2023-06-19 ENCOUNTER — Encounter: Payer: Self-pay | Admitting: Physician Assistant

## 2023-06-19 ENCOUNTER — Ambulatory Visit: Admitting: Physician Assistant

## 2023-06-19 DIAGNOSIS — S76312A Strain of muscle, fascia and tendon of the posterior muscle group at thigh level, left thigh, initial encounter: Secondary | ICD-10-CM

## 2023-06-19 NOTE — Progress Notes (Signed)
 Office Visit Note   Patient: Helen Swanson           Date of Birth: 09-Feb-1945           MRN: [id number] Visit Date: 06/19/2023              Requested by: Helen Swanson, Helen Coppersmith, MD [address] [place],  [place] [address] PCP: Helen Swanson, Helen Coppersmith, MD  Left hamstring strain    HPI: Helen Swanson is a pleasant 78 year old active woman who presents in follow-up for her left insertional hamstring strain.  She has been working with physical therapy just twice as she had difficulty getting appointments.  She says that she has several more appointments scheduled so she said overall she is about the same may be just a little bit better.  They think that part of the reason she is have problem is because of a longstanding quad strain on the right that bothers her when she abducts and abducts such as changing pedals when she is driving her car.  Assessment & Plan: Visit Diagnoses:  1. Hamstring strain, left, initial encounter     Plan: She has not really done much therapy but I think this is going to be the mainstay of treatment for her.  Would like to see her in about a month she will make this appointment could still consider consultation with Dr. Vaughn Swanson if needed again she is planning for a trip in [place] in September  Follow-Up Instructions: Return in about 1 month (around 07/19/2023).   Ortho Exam  Patient is alert, oriented, no adenopathy, well-dressed, normal affect, normal respiratory effort. Examination she still has some proximal hamstring tenderness with stretch.  She is neurovascularly intact strength is intact no real tenderness palpated over the quad no hip pain    Imaging: No results found. No images are attached to the encounter.  Labs: Lab Results  Component Value Date   HGBA1C 5.5 10/29/2020   HGBA1C 5.7 (H) 11/25/2013   HGBA1C  01/21/2010    5.6 (NOTE)                                                                       According to the ADA Clinical  Practice Recommendations for 2011, when HbA1c is used as a screening test:   >=6.5%   Diagnostic of Diabetes Mellitus           (if abnormal result  is confirmed)  5.7-6.4%   Increased risk of developing Diabetes Mellitus  References:Diagnosis and Classification of Diabetes Mellitus,Diabetes Care,2011,34(Suppl 1):S62-S69 and Standards of Medical Care in         Diabetes - 2011,Diabetes Care,2011,34  (Suppl 1):S11-S61.     Lab Results  Component Value Date   ALBUMIN 4.5 12/13/2022   ALBUMIN 4.7 11/02/2021   ALBUMIN 4.9 10/29/2020    Lab Results  Component Value Date   MG 2.8 (H) 01/26/2010   MG 2.8 (H) 01/26/2010   MG 3.8 (H) 01/25/2010   Lab Results  Component Value Date   VD25OH 62.81 12/13/2022   VD25OH 63.17 11/02/2021   VD25OH 59.12 10/29/2020    No results found for: PREALBUMIN    Latest Ref Rng & Units 12/13/2022  9:17 AM 11/02/2021    9:09 AM 10/29/2020    8:44 AM  CBC EXTENDED  WBC 4.0 - 10.5 K/uL 5.5  4.5  4.9   RBC 3.87 - 5.11 Mil/uL 5.01  5.23  5.08   Hemoglobin 12.0 - 15.0 g/dL 28.4  13.2  44.0   HCT 36.0 - 46.0 % 45.5  45.9  45.3   Platelets 150.0 - 400.0 K/uL 280.0  256.0  249.0   NEUT# 1.4 - 7.7 K/uL 4.0  3.0  3.4   Lymph# 0.7 - 4.0 K/uL 0.9  0.9  0.8      There is no height or weight on file to calculate BMI.  Orders:  No orders of the defined types were placed in this encounter.  No orders of the defined types were placed in this encounter.    Procedures: No procedures performed  Clinical Data: No additional findings.  ROS:  All other systems negative, except as noted in the HPI. Review of Systems  Objective: Vital Signs: LMP 01/02/1989   Specialty Comments:  No specialty comments available.  PMFS History: Patient Active Problem List   Diagnosis Date Noted   Hamstring strain, left, initial encounter 05/18/2023   Right bundle branch block 05/17/2021   Intractable migraine without aura and without status migrainosus 08/20/2014    Benign head tremor 08/20/2014   NSTEMI (non-ST elevated myocardial infarction) (HCC)    Emphysema lung (HCC) 11/26/2013   Stress-induced cardiomyopathy 11/25/2013   Elevated LFTs 11/25/2013   Hyperlipidemia 11/25/2013   History of mitral valve repair 11/20/2012   Heart murmur    Mitral regurgitation    Hypotension    Osteoporosis    Tachycardia    Past Medical History:  Diagnosis Date   Emphysema of lung (HCC)    a. By CXR, nonsmoker.   Heart murmur    Hyperlipidemia    Hypotension    IC (interstitial cystitis)    Migraine    a. New onset 11/2013.   Mitral regurgitation    a. s/p MV repair 2012.   MVP (mitral valve prolapse)    a. s/p MV repair 2012.   Osteopenia    Osteoporosis    Pancreatitis    a. At time of MV repair.   PAT (paroxysmal atrial tachycardia) (HCC)    a. Brief during 11/2013 hospitalization.   Pre-diabetes    Shingles    Tachycardia    Takotsubo cardiomyopathy    a. 11/2013: Stress cardiomyopathy (NICM) - EF 25% by cath, 15% by echo, elevated troponin felt due to this. Normal coronaries by cath 11/25/13.   Transaminitis    Tremor     Family History  Problem Relation Age of Onset   Diabetes Mother    Migraines Mother    CVA Father        mild MI   Hiatal hernia Father    Breast cancer Neg Hx     Past Surgical History:  Procedure Laterality Date   DILATION AND CURETTAGE OF UTERUS     x2   ELBOW SURGERY     right elbow for tendonitis   LEFT HEART CATHETERIZATION WITH CORONARY ANGIOGRAM N/A 11/25/2013   Procedure: LEFT HEART CATHETERIZATION WITH CORONARY ANGIOGRAM;  Surgeon: Helen Albany, MD;  Location: [facility] CATH LAB;  Service: Cardiovascular;  Laterality: N/A;   MITRAL VALVE REPAIR  1/12   Dr. Alva Swanson   SHOULDER SURGERY  5/04   right   TOE SURGERY     TOTAL ABDOMINAL  HYSTERECTOMY W/ BILATERAL SALPINGOOPHORECTOMY     WRIST SURGERY     left 10/06, right 7/07   Social History   Occupational History   Not on file  Tobacco Use    Smoking status: Never   Smokeless tobacco: Never  Vaping Use   Vaping status: Never Used  Substance and Sexual Activity   Alcohol use: No   Drug use: No   Sexual activity: Not Currently    Birth control/protection: Surgical    Comment: TAH/BSO

## 2023-06-20 ENCOUNTER — Ambulatory Visit: Admitting: Rehabilitative and Restorative Service Providers"

## 2023-06-20 ENCOUNTER — Encounter: Payer: Self-pay | Admitting: Rehabilitative and Restorative Service Providers"

## 2023-06-20 DIAGNOSIS — M79605 Pain in left leg: Secondary | ICD-10-CM

## 2023-06-20 DIAGNOSIS — M79604 Pain in right leg: Secondary | ICD-10-CM | POA: Diagnosis not present

## 2023-06-20 DIAGNOSIS — M6281 Muscle weakness (generalized): Secondary | ICD-10-CM

## 2023-06-20 DIAGNOSIS — R29898 Other symptoms and signs involving the musculoskeletal system: Secondary | ICD-10-CM

## 2023-06-20 NOTE — Therapy (Signed)
 OUTPATIENT PHYSICAL THERAPY LOWER EXTREMITY TREATMENT    Patient Name: Helen Swanson MRN: [id number] DOB:Apr 27, 1945, 78 y.o., female Today's Date: 06/20/2023  END OF SESSION:  PT End of Session - 06/20/23 0915     Visit Number 4    Number of Visits 17    Date for PT Re-Evaluation 07/27/23    Authorization Type [employer] MCR    Authorization Time Period 06/01/23 to 07/27/23    Progress Note Due on Visit 10    PT Start Time 0915    PT Stop Time 1015    PT Time Calculation (min) 60 min    Activity Tolerance Patient tolerated treatment well;No increased pain    Behavior During Therapy WFL for tasks assessed/performed             Past Medical History:  Diagnosis Date   Emphysema of lung (HCC)    a. By CXR, nonsmoker.   Heart murmur    Hyperlipidemia    Hypotension    IC (interstitial cystitis)    Migraine    a. New onset 11/2013.   Mitral regurgitation    a. s/p MV repair 2012.   MVP (mitral valve prolapse)    a. s/p MV repair 2012.   Osteopenia    Osteoporosis    Pancreatitis    a. At time of MV repair.   PAT (paroxysmal atrial tachycardia) (HCC)    a. Brief during 11/2013 hospitalization.   Pre-diabetes    Shingles    Tachycardia    Takotsubo cardiomyopathy    a. 11/2013: Stress cardiomyopathy (NICM) - EF 25% by cath, 15% by echo, elevated troponin felt due to this. Normal coronaries by cath 11/25/13.   Transaminitis    Tremor    Past Surgical History:  Procedure Laterality Date   DILATION AND CURETTAGE OF UTERUS     x2   ELBOW SURGERY     right elbow for tendonitis   LEFT HEART CATHETERIZATION WITH CORONARY ANGIOGRAM N/A 11/25/2013   Procedure: LEFT HEART CATHETERIZATION WITH CORONARY ANGIOGRAM;  Surgeon: Mickiel Albany, MD;  Location: [facility] CATH LAB;  Service: Cardiovascular;  Laterality: N/A;   MITRAL VALVE REPAIR  1/12   Dr. Alva Jewels   SHOULDER SURGERY  5/04   right   TOE SURGERY     TOTAL ABDOMINAL HYSTERECTOMY W/ BILATERAL SALPINGOOPHORECTOMY      WRIST SURGERY     left 10/06, right 7/07   Patient Active Problem List   Diagnosis Date Noted   Hamstring strain, left, initial encounter 05/18/2023   Right bundle branch block 05/17/2021   Intractable migraine without aura and without status migrainosus 08/20/2014   Benign head tremor 08/20/2014   NSTEMI (non-ST elevated myocardial infarction) (HCC)    Emphysema lung (HCC) 11/26/2013   Stress-induced cardiomyopathy 11/25/2013   Elevated LFTs 11/25/2013   Hyperlipidemia 11/25/2013   History of mitral valve repair 11/20/2012   Heart murmur    Mitral regurgitation    Hypotension    Osteoporosis    Tachycardia     PCP: Zilphia Hilt, Geary Kells MD   REFERRING PROVIDER: Persons, Norma Beckers, PA  REFERRING DIAG:  Diagnosis  [phone] (ICD-10-CM) - Pain in left thigh    THERAPY DIAG:  Pain in left leg  Pain in right leg  Muscle weakness (generalized)  Other symptoms and signs involving the musculoskeletal system  Rationale for Evaluation and Treatment: Rehabilitation  ONSET DATE: mid-April 2025  SUBJECTIVE:   SUBJECTIVE STATEMENT: In January, [name] noted right medial  groin/hip discomfort with transitioning from gas to brake in the car.  In late March 2025, left gluteal pain appeared and has been aggravating since that time.  On a trip up some stairs at the beach, her left hip/gluteals had severe pain and gave way.  Now, her symptoms are more of an uncomfortable ache.    EVAL: This started hurting about mid-April 2025, the steps at the beach really set it off badly. Was having a hard time sitting in the car to even ride to the beach, but going up the stairs at the beach condo caused the back of that leg to catch. If I hadn't been holding on on the steps, I would have fallen. The only time it doesn't hurt is when I'm in bed.   PERTINENT HISTORY: See above  PAIN:  Are you having pain? Yes: NPRS scale:  0-1/10 at rest, 4-5/10 for left hamstrings and right anterior and medial  thigh with movement Pain location: L HS high up, right quad and medial thigh  Pain description: Aching  Aggravating factors: left hamstring extended sitting sore then spikes when she gets up;  walking esp when reaching LLE forward, stairs    Right quad more vastus medialus & rectus: driving moving gas to break.  Getting into high bed lifting RLE Relieving factors: Straightening the leg  PRECAUTIONS: None  RED FLAGS: None   WEIGHT BEARING RESTRICTIONS: No  FALLS:  Has patient fallen in last 6 months? No  LIVING ENVIRONMENT: Lives with: lives alone Lives in: House/apartment Stairs: No Has following equipment at home: None  OCCUPATION: retired- used to Associate Professor, also worked as a travel agency   PLOF: Independent, Independent with basic ADLs, Independent with gait, and Independent with transfers  PATIENT GOALS: address pain, be able to walk without hurting   NEXT MD VISIT:  Referring June 17th 2025  OBJECTIVE:  Note: Objective measures were completed at Evaluation unless otherwise noted.  DIAGNOSTIC FINDINGS:   Radiographs of her pelvis no acute fracture no significant degenerative  changes  PATIENT SURVEYS:    Patient-Specific Activity Scoring Scheme  0 represents "unable to perform." 10 represents "able to perform at prior level. 0 1 2 3 4 5 6 7 8 9  10 (Date and Score)   Activity Eval     1. Walking   8    2. Steps   8    3. Sitting  8   4.Bending over  8   5.    Score 8 I can do everything but it just hurts     Total score = sum of the activity scores/number of activities Minimum detectable change (90%CI) for average score = 2 points Minimum detectable change (90%CI) for single activity score = 3 points  COGNITION: Overall cognitive status: Within functional limits for tasks assessed    MUSCLE LENGTH: Hip flexors WNL B Quads mod limitation B HS severe limitation B; 06/08/23-  moderate limitation B but very guarded  Piriformis WNL B    PALPATION:  Tender at ischial tub L hip, otherwise HS not tender to palpation and no mm spasms noted, R anterior quad not tender to palpation but does relay some pain with active motions   Lumbar AROM (Lt/Rt in degrees): Extension 10 degrees; Lateral bending 30/40 degrees  Flexibility (Lt/Rt in degrees): Hip flexion 95/95 Hamstrings 40/35 Hip ER  30/23 Hip IR  10/8  LOWER EXTREMITY MMT:  MMT Right eval Left eval  Hip flexion 3- limited by chronic R quad  pain  3- limited by HS pain   Hip extension 3 2+  Hip abduction 3+ 3- HS pain   Hip adduction    Hip internal rotation    Hip external rotation    Knee flexion 3+ at best 3 at best painful   Knee extension 4+ 4  Ankle dorsiflexion    Ankle plantarflexion    Ankle inversion    Ankle eversion     (Blank rows = not tested)    FUNCTIONAL TESTS:  5 times sit to stand: 18.7 seconds no UEs, difficulty with eccentric control and some posterior unsteadiness noted   TODAY'S TREATMENT NOTE: 06/20/2023                    Yoga Bridge 10 x 5 seconds; 5 x 10 seconds and 10 x with 2 step march and down Side lie clams with Red Thera-Band 10 x 5 seconds Supine hamstrings stretch with other leg straight 4 x 20 seconds Figure 4 stretch with push 4 x 20 seconds Prone alternating hip extension 10 x 3 seconds Seated hamstrings isometrics (left pushes back into right shin as hard as comfortable) 10 x 5 seconds                                                                                               TODAY'S TREATMENT NOTE: 06/12/2023 Therapeutic Exercise: Nustep seat 6 level 7 with BLEs & BUEs for 8 min. Seated hamstring stretch 1st rep with strap & 2nd rep with strap DF - 30 sec 2 reps each LE;  supine hamstring stretch with femur stabilize vertically and actively extending her knee up to the point she feels a stretch in the hamstring area hold for 3 deep breaths, 3 reps; patient reported feeling stretch more in the area of her  hamstring where she is having pain with the supine stretch. Sidelying clam shell red band BLEs 10 reps BLEs with PT tactile & verbal cues on technique Bridge with red band 10 reps.  PT cued on technique and patient verbalized understanding. Standing quad stretch using towel 3 reps with hold for 3 deep breaths. PT updated HEP with HO, demo, verbal and tactile cues including instruction in proper technique for above exercises.  Patient appears to have a better understanding of how to perform her HEP.  Patient verbalized understanding after completing in clinic.  Self-care: Patient reported pain in her right quad with getting onto her high bed.  Patient demonstrated that she is placing right extremity onto the bed with a flexed, abducted and rotated hip position then trying to weight shift onto her right lower extremity to raise up into the bed.  This is putting an excessive stretch on the quadriceps in an abnormal position which may be a part of her quadriceps pain.  PT recommended standing and hiking right hip up onto the bed to sit on edge of bed then lying down.  Patient able to return demonstration this technique and verbalized no knee pain with a change in technique.   TREATMENT DATE:  06/08/23 Scifit bike L4x8 minutes all four extremities  seat 4  Checked HS length Staggered bridges x10 B Sidelying hip ABD x10 B 0# L side much more difficult  Single leg bridges x5 B Thomas stretch 2x30 seconds B  SLRs 0# x10 B limited ROM R LE  Shuttle LE press 50# x10 Hip hikes x12 B  PATIENT EDUCATION:  Education details: exam findings, POC, HEP  Person educated: Patient Education method: Programmer, multimedia, Demonstration, and Handouts Education comprehension: verbalized understanding, returned demonstration, and needs further education  HOME EXERCISE PROGRAM: Access Code: [email or web address] URL: [email or web address] Date: 06/20/2023 Prepared by: Terral Ferrari  Exercises - Supine Bridge with  Resistance Band  - 1-2 x daily - 7 x weekly - 1-2 sets - 10 reps - 5 second  hold - Clam with Resistance  - 1-2 x daily - 7 x weekly - 1-2 sets - 10 reps - 5 seconds hold - Seated Hamstring Stretch  - 1 x daily - 7 x weekly - 2 sets - 3 reps - 30 seconds  hold - Hooklying Active Hamstring Stretch  - 1-2 x daily - 7 x weekly - 1 sets - 4 reps - 15 seconds hold - Standing Quad Stretch with Towel and Arm Support  - 1-2 x daily - 7 x weekly - 1 sets - 3 reps - 3 deep breaths hold - Supine Figure 4 Piriformis Stretch  - 2 x daily - 7 x weekly - 1 sets - 4 reps - 15 seconds hold - Seated Hamstring Set  - 2 x daily - 7 x weekly - 1-2 sets - 10 reps - 5 seconds hold  ASSESSMENT:  CLINICAL IMPRESSION: [name] reports some frustration with her left hamstrings and right anterior/medial thigh symptoms.  She knows she has only had 3 PT follow-ups and she notes some progress with the intensity of her symptoms.  Left hamstrings are particularly weak tender and we had a hamstrings isometric exercise to address this.  Appropriate gluteal, quadriceps and hamstrings strength progressions should allow Rexene Catching to meet long-term goals.  EVAL: Patient is a 78 y.o. F who was seen today for physical therapy evaluation and treatment for  Diagnosis  M79.652 (ICD-10-CM) - Pain in left thigh  . Objectives as above. She does have some chronic issues with R quad, I suspect that this specific injury may have changed some biomechanics/walking pattern and potentially have contributed to high HS injury on the left. Will make every effort to improve her pain and assist in return to PLOF, especially as she has a significant trip coming up in September.   OBJECTIVE IMPAIRMENTS: Abnormal gait, decreased activity tolerance, decreased balance, decreased mobility, difficulty walking, decreased ROM, decreased strength, increased fascial restrictions, increased muscle spasms, impaired flexibility, and pain.   ACTIVITY LIMITATIONS: sitting,  standing, squatting, stairs, transfers, bed mobility, and locomotion level  PARTICIPATION LIMITATIONS: driving, shopping, community activity, occupation, and yard work  PERSONAL FACTORS: Age, Behavior pattern, Education, Fitness, Past/current experiences, Profession, and Time since onset of injury/illness/exacerbation are also affecting patient's functional outcome.   REHAB POTENTIAL: Good  CLINICAL DECISION MAKING: Stable/uncomplicated  EVALUATION COMPLEXITY: Low   GOALS: Goals reviewed with patient? Yes  SHORT TERM GOALS: Target date: 06/29/2023    Will be compliant with appropriate progressive HEP  Baseline: Goal status: Met 06/20/2023  2.  B HS flexibility to have returned to baseline  Baseline:  Goal status: Ongoing   06/20/2023  3.  Pain to be no more than 3/10 at worst with functional tasks  Baseline:  Goal status: Ongoing  06/20/2023  LONG TERM GOALS: Target date: 07/27/2023   MMT to have improved by one grade in all weak groups  Baseline:  Goal status: Ongoing   06/12/2023  2.  Pain to have resolved in L HS and R quad  Baseline:  Goal status: Ongoing   06/12/2023  3.  Will be able to participate in all desired leisure and sports activities without increase in pain  Baseline:  Goal status: Ongoing   06/12/2023  4.  PSFS to have improved by at least 3 points  Baseline:  Goal status: Ongoing   06/12/2023  5.  Will be able to ambulate community distances and navigate steps as desired without increase in pain R quad and L HS  Baseline:  Goal status:  Ongoing   06/12/2023     PLAN:  PT FREQUENCY: 2x/week  PT DURATION: 8 weeks  PLANNED INTERVENTIONS: 97750- Physical Performance Testing, 97110-Therapeutic exercises, 97530- Therapeutic activity, 97112- Neuromuscular re-education, 97535- Self Care, [id number]- Manual therapy, [id number]- Gait training, [id number]- Aquatic Therapy, [id number]- Vasopneumatic device, Taping, and Dry Needling  PLAN FOR NEXT SESSION: Gradual quadriceps,  hamstrings and gluteal strength progressions.  Manual, percussion or DN as appropriate.   Joli Neas, PT, MPT 06/20/2023, 3:09 PM

## 2023-06-22 ENCOUNTER — Ambulatory Visit: Admitting: Physical Therapy

## 2023-06-22 ENCOUNTER — Encounter: Payer: Self-pay | Admitting: Physical Therapy

## 2023-06-22 DIAGNOSIS — M79604 Pain in right leg: Secondary | ICD-10-CM

## 2023-06-22 DIAGNOSIS — M6281 Muscle weakness (generalized): Secondary | ICD-10-CM | POA: Diagnosis not present

## 2023-06-22 DIAGNOSIS — M79605 Pain in left leg: Secondary | ICD-10-CM

## 2023-06-22 DIAGNOSIS — R29898 Other symptoms and signs involving the musculoskeletal system: Secondary | ICD-10-CM

## 2023-06-22 NOTE — Therapy (Signed)
 OUTPATIENT PHYSICAL THERAPY LOWER EXTREMITY TREATMENT    Patient Name: Helen Swanson MRN: [id number] DOB:1945/07/08, 78 y.o., female Today's Date: 06/22/2023  END OF SESSION:  PT End of Session - 06/22/23 1531     Visit Number 5    Number of Visits 17    Date for PT Re-Evaluation 07/27/23    Authorization Type [employer] MCR    Authorization Time Period 06/01/23 to 07/27/23    Progress Note Due on Visit 10    PT Start Time 1519    PT Stop Time 1557    PT Time Calculation (min) 38 min    Activity Tolerance Patient tolerated treatment well;No increased pain    Behavior During Therapy WFL for tasks assessed/performed              Past Medical History:  Diagnosis Date   Emphysema of lung (HCC)    a. By CXR, nonsmoker.   Heart murmur    Hyperlipidemia    Hypotension    IC (interstitial cystitis)    Migraine    a. New onset 11/2013.   Mitral regurgitation    a. s/p MV repair 2012.   MVP (mitral valve prolapse)    a. s/p MV repair 2012.   Osteopenia    Osteoporosis    Pancreatitis    a. At time of MV repair.   PAT (paroxysmal atrial tachycardia) (HCC)    a. Brief during 11/2013 hospitalization.   Pre-diabetes    Shingles    Tachycardia    Takotsubo cardiomyopathy    a. 11/2013: Stress cardiomyopathy ([facility]) - EF 25% by cath, 15% by echo, elevated troponin felt due to this. Normal coronaries by cath 11/25/13.   Transaminitis    Tremor    Past Surgical History:  Procedure Laterality Date   DILATION AND CURETTAGE OF UTERUS     x2   ELBOW SURGERY     right elbow for tendonitis   LEFT HEART CATHETERIZATION WITH CORONARY ANGIOGRAM N/A 11/25/2013   Procedure: LEFT HEART CATHETERIZATION WITH CORONARY ANGIOGRAM;  Surgeon: Mickiel Albany, MD;  Location: [facility] CATH LAB;  Service: Cardiovascular;  Laterality: N/A;   MITRAL VALVE REPAIR  1/12   Dr. Alva Jewels   SHOULDER SURGERY  5/04   right   TOE SURGERY     TOTAL ABDOMINAL HYSTERECTOMY W/ BILATERAL SALPINGOOPHORECTOMY      WRIST SURGERY     left 10/06, right 7/07   Patient Active Problem List   Diagnosis Date Noted   Hamstring strain, left, initial encounter 05/18/2023   Right bundle branch block 05/17/2021   Intractable migraine without aura and without status migrainosus 08/20/2014   Benign head tremor 08/20/2014   NSTEMI (non-ST elevated myocardial infarction) (HCC)    Emphysema lung (HCC) 11/26/2013   Stress-induced cardiomyopathy 11/25/2013   Elevated LFTs 11/25/2013   Hyperlipidemia 11/25/2013   History of mitral valve repair 11/20/2012   Heart murmur    Mitral regurgitation    Hypotension    Osteoporosis    Tachycardia     PCP: Zilphia Hilt, Geary Kells MD   REFERRING PROVIDER: Persons, Norma Beckers, PA  REFERRING DIAG:  Diagnosis  [phone] (ICD-10-CM) - Pain in left thigh    THERAPY DIAG:  Pain in left leg  Pain in right leg  Muscle weakness (generalized)  Other symptoms and signs involving the musculoskeletal system  Rationale for Evaluation and Treatment: Rehabilitation  ONSET DATE: mid-April 2025  SUBJECTIVE:   SUBJECTIVE STATEMENT:  Some moments and movements  are OK, others are about the same with no significant change. I'm not dying but not comfortable.   EVAL: This started hurting about mid-April 2025, the steps at the beach really set it off badly. Was having a hard time sitting in the car to even ride to the beach, but going up the stairs at the beach condo caused the back of that leg to catch. If I hadn't been holding on on the steps, I would have fallen. The only time it doesn't hurt is when I'm in bed.   PERTINENT HISTORY: See above  PAIN:  Are you having pain? Yes: NPRS scale:  3/10 but didn't do much today  Pain location: L HS high up, right quad and medial thigh  Pain description: Aching, discomfort  Aggravating factors: left hamstring extended sitting sore then spikes when she gets up;  walking esp when reaching LLE forward, stairs    Right quad more  vastus medialus & rectus: driving moving gas to break.  Getting into high bed lifting RLE Relieving factors: Straightening the leg  PRECAUTIONS: None  RED FLAGS: None   WEIGHT BEARING RESTRICTIONS: No  FALLS:  Has patient fallen in last 6 months? No  LIVING ENVIRONMENT: Lives with: lives alone Lives in: House/apartment Stairs: No Has following equipment at home: None  OCCUPATION: retired- used to Associate Professor, also worked as a travel agency   PLOF: Independent, Independent with basic ADLs, Independent with gait, and Independent with transfers  PATIENT GOALS: address pain, be able to walk without hurting   NEXT MD VISIT:  Referring June 17th 2025  OBJECTIVE:  Note: Objective measures were completed at Evaluation unless otherwise noted.  DIAGNOSTIC FINDINGS:   Radiographs of her pelvis no acute fracture no significant degenerative  changes  PATIENT SURVEYS:    Patient-Specific Activity Scoring Scheme  0 represents "unable to perform." 10 represents "able to perform at prior level. 0 1 2 3 4 5 6 7 8 9  10 (Date and Score)   Activity Eval     1. Walking   8    2. Steps   8    3. Sitting  8   4.Bending over  8   5.    Score 8 I can do everything but it just hurts     Total score = sum of the activity scores/number of activities Minimum detectable change (90%CI) for average score = 2 points Minimum detectable change (90%CI) for single activity score = 3 points  COGNITION: Overall cognitive status: Within functional limits for tasks assessed    MUSCLE LENGTH: Hip flexors WNL B Quads mod limitation B HS severe limitation B; 06/08/23-  moderate limitation B but very guarded  Piriformis WNL B   PALPATION:  Tender at ischial tub L hip, otherwise HS not tender to palpation and no mm spasms noted, R anterior quad not tender to palpation but does relay some pain with active motions   Lumbar AROM (Lt/Rt in degrees): Extension 10 degrees; Lateral bending 30/40  degrees  Flexibility (Lt/Rt in degrees): Hip flexion 95/95 Hamstrings 40/35 Hip ER  30/23 Hip IR  10/8  LOWER EXTREMITY MMT:  MMT Right eval Left eval  Hip flexion 3- limited by chronic R quad pain  3- limited by HS pain   Hip extension 3 2+  Hip abduction 3+ 3- HS pain   Hip adduction    Hip internal rotation    Hip external rotation    Knee flexion 3+ at best 3 at best  painful   Knee extension 4+ 4  Ankle dorsiflexion    Ankle plantarflexion    Ankle inversion    Ankle eversion     (Blank rows = not tested)    FUNCTIONAL TESTS:  5 times sit to stand: 18.7 seconds no UEs, difficulty with eccentric control and some posterior unsteadiness noted      TODAY'S TREATMENT NOTE   06/22/23  Nustep L6x8 minutes all four extremities seat 6 Shuttle LE press BLEs 50# 2x12  Shuttle LE press single leg 25# 1x10 B Quadruped hip ABD red TB x10 R, painful L Quadruped hip extension red TB x10 R, painful L   Noted LLD with R LE about 1/2 inch shorter than left placed heel lift- had improved HS pain with mobility after placement of heel lift- educated her to try this over the weekend but take it out if pain increases in L HS or R quad       TODAY'S TREATMENT NOTE: 06/20/2023                    Yoga Bridge 10 x 5 seconds; 5 x 10 seconds and 10 x with 2 step march and down Side lie clams with Red Thera-Band 10 x 5 seconds Supine hamstrings stretch with other leg straight 4 x 20 seconds Figure 4 stretch with push 4 x 20 seconds Prone alternating hip extension 10 x 3 seconds Seated hamstrings isometrics (left pushes back into right shin as hard as comfortable) 10 x 5 seconds                                                                                               TODAY'S TREATMENT NOTE: 06/12/2023 Therapeutic Exercise: Nustep seat 6 level 7 with BLEs & BUEs for 8 min. Seated hamstring stretch 1st rep with strap & 2nd rep with strap DF - 30 sec 2 reps each LE;  supine  hamstring stretch with femur stabilize vertically and actively extending her knee up to the point she feels a stretch in the hamstring area hold for 3 deep breaths, 3 reps; patient reported feeling stretch more in the area of her hamstring where she is having pain with the supine stretch. Sidelying clam shell red band BLEs 10 reps BLEs with PT tactile & verbal cues on technique Bridge with red band 10 reps.  PT cued on technique and patient verbalized understanding. Standing quad stretch using towel 3 reps with hold for 3 deep breaths. PT updated HEP with HO, demo, verbal and tactile cues including instruction in proper technique for above exercises.  Patient appears to have a better understanding of how to perform her HEP.  Patient verbalized understanding after completing in clinic.  Self-care: Patient reported pain in her right quad with getting onto her high bed.  Patient demonstrated that she is placing right extremity onto the bed with a flexed, abducted and rotated hip position then trying to weight shift onto her right lower extremity to raise up into the bed.  This is putting an excessive stretch on the quadriceps in an abnormal position which may be  a part of her quadriceps pain.  PT recommended standing and hiking right hip up onto the bed to sit on edge of bed then lying down.  Patient able to return demonstration this technique and verbalized no knee pain with a change in technique.   TREATMENT DATE:  06/08/23 Scifit bike L4x8 minutes all four extremities seat 4  Checked HS length Staggered bridges x10 B Sidelying hip ABD x10 B 0# L side much more difficult  Single leg bridges x5 B Thomas stretch 2x30 seconds B  SLRs 0# x10 B limited ROM R LE  Shuttle LE press 50# x10 Hip hikes x12 B  PATIENT EDUCATION:  Education details: exam findings, POC, HEP  Person educated: Patient Education method: Programmer, multimedia, Facilities manager, and Handouts Education comprehension: verbalized  understanding, returned demonstration, and needs further education  HOME EXERCISE PROGRAM: Access Code: [email or web address] URL: [email or web address] Date: 06/20/2023 Prepared by: Terral Ferrari  Exercises - Supine Bridge with Resistance Band  - 1-2 x daily - 7 x weekly - 1-2 sets - 10 reps - 5 second  hold - Clam with Resistance  - 1-2 x daily - 7 x weekly - 1-2 sets - 10 reps - 5 seconds hold - Seated Hamstring Stretch  - 1 x daily - 7 x weekly - 2 sets - 3 reps - 30 seconds  hold - Hooklying Active Hamstring Stretch  - 1-2 x daily - 7 x weekly - 1 sets - 4 reps - 15 seconds hold - Standing Quad Stretch with Towel and Arm Support  - 1-2 x daily - 7 x weekly - 1 sets - 3 reps - 3 deep breaths hold - Supine Figure 4 Piriformis Stretch  - 2 x daily - 7 x weekly - 1 sets - 4 reps - 15 seconds hold - Seated Hamstring Set  - 2 x daily - 7 x weekly - 1-2 sets - 10 reps - 5 seconds hold  ASSESSMENT:  CLINICAL IMPRESSION:   Continued working on functional exercises and activities with attempts at reducing HS and quad pain. Unfortunately her sx have been a bit stubborn but she is seeing some periods of improvement on some days with certain tasks. Will continue to attempt to address symptoms with progressive challenges. Did note leg length difference today, placed trial heel lift with some possible improvement in L HS pain but the real test of this will be this weekend when she is busy with her friend. Educated to take out the heel lift immediately should pain increase with mobility.     EVAL: Patient is a 78 y.o. F who was seen today for physical therapy evaluation and treatment for  Diagnosis  M79.652 (ICD-10-CM) - Pain in left thigh  . Objectives as above. She does have some chronic issues with R quad, I suspect that this specific injury may have changed some biomechanics/walking pattern and potentially have contributed to high HS injury on the left. Will make every effort to improve her  pain and assist in return to PLOF, especially as she has a significant trip coming up in September.   OBJECTIVE IMPAIRMENTS: Abnormal gait, decreased activity tolerance, decreased balance, decreased mobility, difficulty walking, decreased ROM, decreased strength, increased fascial restrictions, increased muscle spasms, impaired flexibility, and pain.   ACTIVITY LIMITATIONS: sitting, standing, squatting, stairs, transfers, bed mobility, and locomotion level  PARTICIPATION LIMITATIONS: driving, shopping, community activity, occupation, and yard work  PERSONAL FACTORS: Age, Behavior pattern, Education, Fitness, Past/current experiences, Profession, and Time since onset of injury/illness/exacerbation are  also affecting patient's functional outcome.   REHAB POTENTIAL: Good  CLINICAL DECISION MAKING: Stable/uncomplicated  EVALUATION COMPLEXITY: Low   GOALS: Goals reviewed with patient? Yes  SHORT TERM GOALS: Target date: 06/29/2023    Will be compliant with appropriate progressive HEP  Baseline: Goal status: Met 06/20/2023  2.  B HS flexibility to have returned to baseline  Baseline:  Goal status: Ongoing   06/20/2023  3.  Pain to be no more than 3/10 at worst with functional tasks  Baseline:  Goal status: Ongoing   06/20/2023  LONG TERM GOALS: Target date: 07/27/2023   MMT to have improved by one grade in all weak groups  Baseline:  Goal status: Ongoing   06/12/2023  2.  Pain to have resolved in L HS and R quad  Baseline:  Goal status: Ongoing   06/12/2023  3.  Will be able to participate in all desired leisure and sports activities without increase in pain  Baseline:  Goal status: Ongoing   06/12/2023  4.  PSFS to have improved by at least 3 points  Baseline:  Goal status: Ongoing   06/12/2023  5.  Will be able to ambulate community distances and navigate steps as desired without increase in pain R quad and L HS  Baseline:  Goal status:  Ongoing    06/12/2023     PLAN:  PT FREQUENCY: 2x/week  PT DURATION: 8 weeks  PLANNED INTERVENTIONS: 97750- Physical Performance Testing, 97110-Therapeutic exercises, 97530- Therapeutic activity, 97112- Neuromuscular re-education, 97535- Self Care, 16109- Manual therapy, [id number]- Gait training, [id number]- Aquatic Therapy, [id number]- Vasopneumatic device, Taping, and Dry Needling  PLAN FOR NEXT SESSION: Gradual quadriceps, hamstrings and gluteal strength progressions.  Manual, percussion or DN as appropriate/desired. How does the heel lift feel? Update MMT    Terrel Ferries, PT, DPT 06/22/23 4:03 PM

## 2023-06-26 ENCOUNTER — Ambulatory Visit: Admitting: Physical Therapy

## 2023-06-26 ENCOUNTER — Encounter: Payer: Self-pay | Admitting: Physical Therapy

## 2023-06-26 DIAGNOSIS — M79605 Pain in left leg: Secondary | ICD-10-CM

## 2023-06-26 DIAGNOSIS — M6281 Muscle weakness (generalized): Secondary | ICD-10-CM | POA: Diagnosis not present

## 2023-06-26 DIAGNOSIS — R29898 Other symptoms and signs involving the musculoskeletal system: Secondary | ICD-10-CM | POA: Diagnosis not present

## 2023-06-26 DIAGNOSIS — M79604 Pain in right leg: Secondary | ICD-10-CM | POA: Diagnosis not present

## 2023-06-26 NOTE — Therapy (Signed)
 OUTPATIENT PHYSICAL THERAPY LOWER EXTREMITY TREATMENT    Patient Name: Helen Swanson MRN: [id number] DOB:[birth year], 78 y.o., female Today's Date: 06/26/2023  END OF SESSION:  PT End of Session - 06/26/23 1010     Visit Number 6    Number of Visits 17    Date for PT Re-Evaluation 07/27/23    Authorization Type [employer] MCR    Authorization Time Period 06/01/23 to 07/27/23    Progress Note Due on Visit 10    PT Start Time 1015    PT Stop Time 1055    PT Time Calculation (min) 40 min    Activity Tolerance Patient tolerated treatment well;No increased pain    Behavior During Therapy WFL for tasks assessed/performed           Past Medical History:  Diagnosis Date   Emphysema of lung (HCC)    a. By CXR, nonsmoker.   Heart murmur    Hyperlipidemia    Hypotension    IC (interstitial cystitis)    Migraine    a. New onset 11/2013.   Mitral regurgitation    a. s/p MV repair 2012.   MVP (mitral valve prolapse)    a. s/p MV repair 2012.   Osteopenia    Osteoporosis    Pancreatitis    a. At time of MV repair.   PAT (paroxysmal atrial tachycardia) (HCC)    a. Brief during 11/2013 hospitalization.   Pre-diabetes    Shingles    Tachycardia    Takotsubo cardiomyopathy    a. 11/2013: Stress cardiomyopathy ([facility]) - EF 25% by cath, 15% by echo, elevated troponin felt due to this. Normal coronaries by cath 11/25/13.   Transaminitis    Tremor    Past Surgical History:  Procedure Laterality Date   DILATION AND CURETTAGE OF UTERUS     x2   ELBOW SURGERY     right elbow for tendonitis   LEFT HEART CATHETERIZATION WITH CORONARY ANGIOGRAM N/A 11/25/2013   Procedure: LEFT HEART CATHETERIZATION WITH CORONARY ANGIOGRAM;  Surgeon: Victory LELON Claudene DOUGLAS, MD;  Location: [facility] CATH LAB;  Service: Cardiovascular;  Laterality: N/A;   MITRAL VALVE REPAIR  1/12   Dr. Dusty   SHOULDER SURGERY  5/04   right   TOE SURGERY     TOTAL ABDOMINAL HYSTERECTOMY W/ BILATERAL SALPINGOOPHORECTOMY      WRIST SURGERY     left 10/06, right 7/07   Patient Active Problem List   Diagnosis Date Noted   Hamstring strain, left, initial encounter 05/18/2023   Right bundle branch block 05/17/2021   Intractable migraine without aura and without status migrainosus 08/20/2014   Benign head tremor 08/20/2014   NSTEMI (non-ST elevated myocardial infarction) (HCC)    Emphysema lung (HCC) 11/26/2013   Stress-induced cardiomyopathy 11/25/2013   Elevated LFTs 11/25/2013   Hyperlipidemia 11/25/2013   History of mitral valve repair 11/20/2012   Heart murmur    Mitral regurgitation    Hypotension    Osteoporosis    Tachycardia     PCP: Theophilus Andrews, Tully MD   REFERRING PROVIDER: Persons, Ronal Dragon, PA  REFERRING DIAG:  Diagnosis  [id number] (ICD-10-CM) - Pain in left thigh    THERAPY DIAG:  Pain in left leg  Pain in right leg  Muscle weakness (generalized)  Other symptoms and signs involving the musculoskeletal system  Rationale for Evaluation and Treatment: Rehabilitation  ONSET DATE: mid-April 2025  SUBJECTIVE:   SUBJECTIVE STATEMENT: Pt reports heel lift didn't seem to help  this weekend. Has been compliant to her exercise.   EVAL: This started hurting about mid-April 2025, the steps at the beach really set it off badly. Was having a hard time sitting in the car to even ride to the beach, but going up the stairs at the beach condo caused the back of that leg to catch. If I hadn't been holding on on the steps, I would have fallen. The only time it doesn't hurt is when I'm in bed.   PERTINENT HISTORY: See above  PAIN:  Are you having pain? Yes: NPRS scale:  3/10 but didn't do much today  Pain location: L HS high up, right quad and medial thigh  Pain description: Aching, discomfort  Aggravating factors: left hamstring extended sitting sore then spikes when she gets up;  walking esp when reaching LLE forward, stairs    Right quad more vastus medialus & rectus: driving moving  gas to break.  Getting into high bed lifting RLE Relieving factors: Straightening the leg  PRECAUTIONS: None  RED FLAGS: None   WEIGHT BEARING RESTRICTIONS: No  FALLS:  Has patient fallen in last 6 months? No  LIVING ENVIRONMENT: Lives with: lives alone Lives in: House/apartment Stairs: No Has following equipment at home: None  OCCUPATION: retired- used to Associate Professor, also worked as a travel agency   PLOF: Independent, Independent with basic ADLs, Independent with gait, and Independent with transfers  PATIENT GOALS: address pain, be able to walk without hurting   NEXT MD VISIT:  Referring June 17th 2025  OBJECTIVE:  Note: Objective measures were completed at Evaluation unless otherwise noted.  DIAGNOSTIC FINDINGS:   Radiographs of her pelvis no acute fracture no significant degenerative  changes  PATIENT SURVEYS:    Patient-Specific Activity Scoring Scheme  0 represents "unable to perform." 10 represents "able to perform at prior level. 0 1 2 3 4 5 6 7 8 9  10 (Date and Score)   Activity Eval     1. Walking   8    2. Steps   8    3. Sitting  8   4.Bending over  8   5.    Score 8 I can do everything but it just hurts     Total score = sum of the activity scores/number of activities Minimum detectable change (90%CI) for average score = 2 points Minimum detectable change (90%CI) for single activity score = 3 points  COGNITION: Overall cognitive status: Within functional limits for tasks assessed    MUSCLE LENGTH: Hip flexors WNL B Quads mod limitation B HS severe limitation B; 06/08/23-  moderate limitation B but very guarded  Piriformis WNL B   PALPATION:  Tender at ischial tub L hip, otherwise HS not tender to palpation and no mm spasms noted, R anterior quad not tender to palpation but does relay some pain with active motions   Lumbar AROM (Lt/Rt in degrees): Extension 10 degrees; Lateral bending 30/40 degrees  Flexibility (Lt/Rt in  degrees): Hip flexion 95/95 Hamstrings 40/35 Hip ER  30/23 Hip IR  10/8  LOWER EXTREMITY MMT:  MMT Right eval Left eval  Hip flexion 3- limited by chronic R quad pain  3- limited by HS pain   Hip extension 3 2+  Hip abduction 3+ 3- HS pain   Hip adduction    Hip internal rotation    Hip external rotation    Knee flexion 3+ at best 3 at best painful   Knee extension 4+ 4  Ankle  dorsiflexion    Ankle plantarflexion    Ankle inversion    Ankle eversion     (Blank rows = not tested)    FUNCTIONAL TESTS:  5 times sit to stand: 18.7 seconds no UEs, difficulty with eccentric control and some posterior unsteadiness noted    TODAY'S TREATMENT NOTE  06/26/23 Scifit bike L1; 3 min fwd, 3 min bwd Standing hip flexor/gastroc stretch x 30 Standing hamstring stretch with knee slightly bent 2x30 Seated piriformis stretch 2x30 Seated figure 4 stretch 2x30 Supine bridge walkouts 2x10 Supine quad set with towel under R knee with LE in slight abd 2x10 R only Supine SLR on L 2x10 Supine hip adductor stretch with strap on R 2x30 Supine hip abd 2x10 Supine glute set with heel press 2x10x3 Supine hamstring set with heel press 2x10x3 Standing L stretch 2x 30 Standing hip hinge into glute set 2x10 standing at counter   TODAY'S TREATMENT NOTE   06/22/23 Nustep L6x8 minutes all four extremities seat 6 Shuttle LE press BLEs 50# 2x12  Shuttle LE press single leg 25# 1x10 B Quadruped hip ABD red TB x10 R, painful L Quadruped hip extension red TB x10 R, painful L   Noted LLD with R LE about 1/2 inch shorter than left placed heel lift- had improved HS pain with mobility after placement of heel lift- educated her to try this over the weekend but take it out if pain increases in L HS or R quad       TODAY'S TREATMENT NOTE: 06/20/2023                    Yoga Bridge 10 x 5 seconds; 5 x 10 seconds and 10 x with 2 step march and down Side lie clams with Red Thera-Band 10 x 5  seconds Supine hamstrings stretch with other leg straight 4 x 20 seconds Figure 4 stretch with push 4 x 20 seconds Prone alternating hip extension 10 x 3 seconds Seated hamstrings isometrics (left pushes back into right shin as hard as comfortable) 10 x 5 seconds                                                                                               TODAY'S TREATMENT NOTE: 06/12/2023 Therapeutic Exercise: Nustep seat 6 level 7 with BLEs & BUEs for 8 min. Seated hamstring stretch 1st rep with strap & 2nd rep with strap DF - 30 sec 2 reps each LE;  supine hamstring stretch with femur stabilize vertically and actively extending her knee up to the point she feels a stretch in the hamstring area hold for 3 deep breaths, 3 reps; patient reported feeling stretch more in the area of her hamstring where she is having pain with the supine stretch. Sidelying clam shell red band BLEs 10 reps BLEs with PT tactile & verbal cues on technique Bridge with red band 10 reps.  PT cued on technique and patient verbalized understanding. Standing quad stretch using towel 3 reps with hold for 3 deep breaths. PT updated HEP with HO, demo, verbal and tactile cues including instruction in proper  technique for above exercises.  Patient appears to have a better understanding of how to perform her HEP.  Patient verbalized understanding after completing in clinic.  Self-care: Patient reported pain in her right quad with getting onto her high bed.  Patient demonstrated that she is placing right extremity onto the bed with a flexed, abducted and rotated hip position then trying to weight shift onto her right lower extremity to raise up into the bed.  This is putting an excessive stretch on the quadriceps in an abnormal position which may be a part of her quadriceps pain.  PT recommended standing and hiking right hip up onto the bed to sit on edge of bed then lying down.  Patient able to return demonstration this technique  and verbalized no knee pain with a change in technique.   PATIENT EDUCATION:  Education details: exam findings, POC, HEP  Person educated: Patient Education method: Explanation, Demonstration, and Handouts Education comprehension: verbalized understanding, returned demonstration, and needs further education  HOME EXERCISE PROGRAM: Access Code: [email or web address] URL: [email or web address] Date: 06/20/2023 Prepared by: Lamar Ivory  Exercises - Supine Bridge with Resistance Band  - 1-2 x daily - 7 x weekly - 1-2 sets - 10 reps - 5 second  hold - Clam with Resistance  - 1-2 x daily - 7 x weekly - 1-2 sets - 10 reps - 5 seconds hold - Seated Hamstring Stretch  - 1 x daily - 7 x weekly - 2 sets - 3 reps - 30 seconds  hold - Hooklying Active Hamstring Stretch  - 1-2 x daily - 7 x weekly - 1 sets - 4 reps - 15 seconds hold - Standing Quad Stretch with Towel and Arm Support  - 1-2 x daily - 7 x weekly - 1 sets - 3 reps - 3 deep breaths hold - Supine Figure 4 Piriformis Stretch  - 2 x daily - 7 x weekly - 1 sets - 4 reps - 15 seconds hold - Seated Hamstring Set  - 2 x daily - 7 x weekly - 1-2 sets - 10 reps - 5 seconds hold  ASSESSMENT:  CLINICAL IMPRESSION: Continued gentle stretching of hamstring and glutes as pt's pain remains mostly along her R ischial tub. Added stretch for pt's L ant thigh pain -- pt points along her medial quad and adductor so provided an adductor stretch. Worked in standing to improve glute activation without hamstring over activation/irritation.     EVAL: Patient is a 78 y.o. F who was seen today for physical therapy evaluation and treatment for  Diagnosis  M79.652 (ICD-10-CM) - Pain in left thigh  . Objectives as above. She does have some chronic issues with R quad, I suspect that this specific injury may have changed some biomechanics/walking pattern and potentially have contributed to high HS injury on the left. Will make every effort to improve her pain  and assist in return to PLOF, especially as she has a significant trip coming up in September.   OBJECTIVE IMPAIRMENTS: Abnormal gait, decreased activity tolerance, decreased balance, decreased mobility, difficulty walking, decreased ROM, decreased strength, increased fascial restrictions, increased muscle spasms, impaired flexibility, and pain.   ACTIVITY LIMITATIONS: sitting, standing, squatting, stairs, transfers, bed mobility, and locomotion level  PARTICIPATION LIMITATIONS: driving, shopping, community activity, occupation, and yard work  PERSONAL FACTORS: Age, Behavior pattern, Education, Fitness, Past/current experiences, Profession, and Time since onset of injury/illness/exacerbation are also affecting patient's functional outcome.   REHAB POTENTIAL: Good  CLINICAL DECISION MAKING: Stable/uncomplicated  EVALUATION COMPLEXITY: Low   GOALS: Goals reviewed with patient? Yes  SHORT TERM GOALS: Target date: 06/29/2023   Will be compliant with appropriate progressive HEP  Baseline: Goal status: Met 06/20/2023  2.  B HS flexibility to have returned to baseline  Baseline:  Goal status: Ongoing   06/20/2023  3.  Pain to be no more than 3/10 at worst with functional tasks  Baseline:  Goal status: Ongoing   06/20/2023  LONG TERM GOALS: Target date: 07/27/2023   MMT to have improved by one grade in all weak groups  Baseline:  Goal status: Ongoing   06/12/2023  2.  Pain to have resolved in L HS and R quad  Baseline:  Goal status: Ongoing   06/12/2023  3.  Will be able to participate in all desired leisure and sports activities without increase in pain  Baseline:  Goal status: Ongoing   06/12/2023  4.  PSFS to have improved by at least 3 points  Baseline:  Goal status: Ongoing   06/12/2023  5.  Will be able to ambulate community distances and navigate steps as desired without increase in pain R quad and L HS  Baseline:  Goal status:  Ongoing   06/12/2023     PLAN:  PT  FREQUENCY: 2x/week  PT DURATION: 8 weeks  PLANNED INTERVENTIONS: 97750- Physical Performance Testing, 97110-Therapeutic exercises, 97530- Therapeutic activity, 97112- Neuromuscular re-education, 97535- Self Care, [id number]- Manual therapy, [id number]- Gait training, [id number]- Aquatic Therapy, [id number]- Vasopneumatic device, Taping, and Dry Needling  PLAN FOR NEXT SESSION: Gradual quadriceps, hamstrings and gluteal strength progressions.  Manual, percussion or DN as appropriate/desired. How does the heel lift feel? Update MMT    Nashae Maudlin April Ma L Pleasant Britz, PT, DPT 06/26/23 10:10 AM

## 2023-06-29 ENCOUNTER — Ambulatory Visit: Admitting: Physical Therapy

## 2023-06-29 ENCOUNTER — Encounter: Payer: Self-pay | Admitting: Physical Therapy

## 2023-06-29 DIAGNOSIS — M79605 Pain in left leg: Secondary | ICD-10-CM

## 2023-06-29 DIAGNOSIS — M6281 Muscle weakness (generalized): Secondary | ICD-10-CM | POA: Diagnosis not present

## 2023-06-29 DIAGNOSIS — M79604 Pain in right leg: Secondary | ICD-10-CM | POA: Diagnosis not present

## 2023-06-29 DIAGNOSIS — R29898 Other symptoms and signs involving the musculoskeletal system: Secondary | ICD-10-CM | POA: Diagnosis not present

## 2023-06-29 NOTE — Therapy (Signed)
 OUTPATIENT PHYSICAL THERAPY LOWER EXTREMITY TREATMENT    Patient Name: Helen Swanson MRN: [id number] DOB:[birth year], 78 y.o., female Today's Date: 06/29/2023  END OF SESSION:  PT End of Session - 06/29/23 1045     Visit Number 7    Number of Visits 17    Date for PT Re-Evaluation 07/27/23    Authorization Type [employer] MCR    Authorization Time Period 06/01/23 to 07/27/23    Progress Note Due on Visit 10    PT Start Time 1018    PT Stop Time 1056    PT Time Calculation (min) 38 min    Activity Tolerance Patient tolerated treatment well;No increased pain    Behavior During Therapy WFL for tasks assessed/performed            Past Medical History:  Diagnosis Date   Emphysema of lung (HCC)    a. By CXR, nonsmoker.   Heart murmur    Hyperlipidemia    Hypotension    IC (interstitial cystitis)    Migraine    a. New onset 11/2013.   Mitral regurgitation    a. s/p MV repair 2012.   MVP (mitral valve prolapse)    a. s/p MV repair 2012.   Osteopenia    Osteoporosis    Pancreatitis    a. At time of MV repair.   PAT (paroxysmal atrial tachycardia) (HCC)    a. Brief during 11/2013 hospitalization.   Pre-diabetes    Shingles    Tachycardia    Takotsubo cardiomyopathy    a. 11/2013: Stress cardiomyopathy ([facility]) - EF 25% by cath, 15% by echo, elevated troponin felt due to this. Normal coronaries by cath 11/25/13.   Transaminitis    Tremor    Past Surgical History:  Procedure Laterality Date   DILATION AND CURETTAGE OF UTERUS     x2   ELBOW SURGERY     right elbow for tendonitis   LEFT HEART CATHETERIZATION WITH CORONARY ANGIOGRAM N/A 11/25/2013   Procedure: LEFT HEART CATHETERIZATION WITH CORONARY ANGIOGRAM;  Surgeon: Victory LELON Claudene DOUGLAS, MD;  Location: [facility] CATH LAB;  Service: Cardiovascular;  Laterality: N/A;   MITRAL VALVE REPAIR  1/12   Dr. Dusty   SHOULDER SURGERY  5/04   right   TOE SURGERY     TOTAL ABDOMINAL HYSTERECTOMY W/ BILATERAL SALPINGOOPHORECTOMY      WRIST SURGERY     left 10/06, right 7/07   Patient Active Problem List   Diagnosis Date Noted   Hamstring strain, left, initial encounter 05/18/2023   Right bundle branch block 05/17/2021   Intractable migraine without aura and without status migrainosus 08/20/2014   Benign head tremor 08/20/2014   NSTEMI (non-ST elevated myocardial infarction) (HCC)    Emphysema lung (HCC) 11/26/2013   Stress-induced cardiomyopathy 11/25/2013   Elevated LFTs 11/25/2013   Hyperlipidemia 11/25/2013   History of mitral valve repair 11/20/2012   Heart murmur    Mitral regurgitation    Hypotension    Osteoporosis    Tachycardia     PCP: Theophilus Andrews, Tully MD   REFERRING PROVIDER: Persons, Ronal Dragon, PA  REFERRING DIAG:  Diagnosis  [phone] (ICD-10-CM) - Pain in left thigh    THERAPY DIAG:  Pain in left leg  Pain in right leg  Muscle weakness (generalized)  Other symptoms and signs involving the musculoskeletal system  Rationale for Evaluation and Treatment: Rehabilitation  ONSET DATE: mid-April 2025  SUBJECTIVE:   SUBJECTIVE STATEMENT:   Still feeling about the same,  getting a little discouraged. Took the heel lift out, no change in sx and it was hurting my foot    EVAL: This started hurting about mid-April 2025, the steps at the beach really set it off badly. Was having a hard time sitting in the car to even ride to the beach, but going up the stairs at the beach condo caused the back of that leg to catch. If I hadn't been holding on on the steps, I would have fallen. The only time it doesn't hurt is when I'm in bed.   PERTINENT HISTORY: See above  PAIN:  Are you having pain? Yes: NPRS scale:  3/10  Pain location: L HS high up, right quad and medial thigh  Pain description: Aching, discomfort  Aggravating factors: left hamstring extended sitting sore then spikes when she gets up;  walking esp when reaching LLE forward, stairs, squatting     Right quad more vastus  medialus & rectus: driving moving gas to break.  Getting into high bed lifting RLE Relieving factors: Straightening the leg  PRECAUTIONS: None  RED FLAGS: None   WEIGHT BEARING RESTRICTIONS: No  FALLS:  Has patient fallen in last 6 months? No  LIVING ENVIRONMENT: Lives with: lives alone Lives in: House/apartment Stairs: No Has following equipment at home: None  OCCUPATION: retired- used to Associate Professor, also worked as a travel agency   PLOF: Independent, Independent with basic ADLs, Independent with gait, and Independent with transfers  PATIENT GOALS: address pain, be able to walk without hurting   NEXT MD VISIT:  Referring June 17th 2025  OBJECTIVE:  Note: Objective measures were completed at Evaluation unless otherwise noted.  DIAGNOSTIC FINDINGS:   Radiographs of her pelvis no acute fracture no significant degenerative  changes  PATIENT SURVEYS:    Patient-Specific Activity Scoring Scheme  0 represents "unable to perform." 10 represents "able to perform at prior level. 0 1 2 3 4 5 6 7 8 9  10 (Date and Score)   Activity Eval  06/29/23   1. Walking   8  8  2. Steps   8 8   3. Sitting  8 8  4.Bending over  8 8  5.    Score 8 I can do everything but it just hurts  8   Total score = sum of the activity scores/number of activities Minimum detectable change (90%CI) for average score = 2 points Minimum detectable change (90%CI) for single activity score = 3 points  COGNITION: Overall cognitive status: Within functional limits for tasks assessed    MUSCLE LENGTH: Hip flexors WNL B Quads mod limitation B HS severe limitation B; 06/08/23-  moderate limitation B but very guarded; 06/29/23 mod limitation B   Piriformis WNL B   PALPATION:  Tender at ischial tub L hip, otherwise HS not tender to palpation and no mm spasms noted, R anterior quad not tender to palpation but does relay some pain with active motions   Lumbar AROM (Lt/Rt in degrees): Extension 10  degrees; Lateral bending 30/40 degrees  Flexibility (Lt/Rt in degrees): Hip flexion 95/95 Hamstrings 40/35 Hip ER  30/23 Hip IR  10/8  LOWER EXTREMITY MMT:  MMT Right eval Left eval Right 06/29/23 Left 06/29/23  Hip flexion 3- limited by chronic R quad pain  3- limited by HS pain  3- 3+  Hip extension 3 2+    Hip abduction 3+ 3- HS pain  3+ 3+  Hip adduction      Hip internal rotation  Hip external rotation      Knee flexion 3+ at best 3 at best painful  4 3+ at best painful   Knee extension 4+ 4 4 4+  Ankle dorsiflexion      Ankle plantarflexion      Ankle inversion      Ankle eversion       (Blank rows = not tested)    FUNCTIONAL TESTS:  5 times sit to stand: 18.7 seconds no UEs, difficulty with eccentric control and some posterior unsteadiness noted    TODAY'S TREATMENT NOTE   06/29/23  Nustep L6x8 minutes all four extremities seat 6  MMT and PSFS, discussion of progress with PT- discussed that she might benefit from anti-inflammatories pending MD opinion  Bridge walk outs x12 SLRs B x12  Supine hip ABD x12 B  Percussion gun L quad with towel for padding     06/26/23 Scifit bike L1; 3 min fwd, 3 min bwd Standing hip flexor/gastroc stretch x 30 Standing hamstring stretch with knee slightly bent 2x30 Seated piriformis stretch 2x30 Seated figure 4 stretch 2x30 Supine bridge walkouts 2x10 Supine quad set with towel under R knee with LE in slight abd 2x10 R only Supine SLR on L 2x10 Supine hip adductor stretch with strap on R 2x30 Supine hip abd 2x10 Supine glute set with heel press 2x10x3 Supine hamstring set with heel press 2x10x3 Standing L stretch 2x 30 Standing hip hinge into glute set 2x10 standing at counter   TODAY'S TREATMENT NOTE   06/22/23 Nustep L6x8 minutes all four extremities seat 6 Shuttle LE press BLEs 50# 2x12  Shuttle LE press single leg 25# 1x10 B Quadruped hip ABD red TB x10 R, painful L Quadruped hip extension  red TB x10 R, painful L   Noted LLD with R LE about 1/2 inch shorter than left placed heel lift- had improved HS pain with mobility after placement of heel lift- educated her to try this over the weekend but take it out if pain increases in L HS or R quad       TODAY'S TREATMENT NOTE: 06/20/2023                    Yoga Bridge 10 x 5 seconds; 5 x 10 seconds and 10 x with 2 step march and down Side lie clams with Red Thera-Band 10 x 5 seconds Supine hamstrings stretch with other leg straight 4 x 20 seconds Figure 4 stretch with push 4 x 20 seconds Prone alternating hip extension 10 x 3 seconds Seated hamstrings isometrics (left pushes back into right shin as hard as comfortable) 10 x 5 seconds                                                                                               TODAY'S TREATMENT NOTE: 06/12/2023 Therapeutic Exercise: Nustep seat 6 level 7 with BLEs & BUEs for 8 min. Seated hamstring stretch 1st rep with strap & 2nd rep with strap DF - 30 sec 2 reps each LE;  supine hamstring stretch with femur stabilize vertically and actively extending her knee  up to the point she feels a stretch in the hamstring area hold for 3 deep breaths, 3 reps; patient reported feeling stretch more in the area of her hamstring where she is having pain with the supine stretch. Sidelying clam shell red band BLEs 10 reps BLEs with PT tactile & verbal cues on technique Bridge with red band 10 reps.  PT cued on technique and patient verbalized understanding. Standing quad stretch using towel 3 reps with hold for 3 deep breaths. PT updated HEP with HO, demo, verbal and tactile cues including instruction in proper technique for above exercises.  Patient appears to have a better understanding of how to perform her HEP.  Patient verbalized understanding after completing in clinic.  Self-care: Patient reported pain in her right quad with getting onto her high bed.  Patient demonstrated that she is  placing right extremity onto the bed with a flexed, abducted and rotated hip position then trying to weight shift onto her right lower extremity to raise up into the bed.  This is putting an excessive stretch on the quadriceps in an abnormal position which may be a part of her quadriceps pain.  PT recommended standing and hiking right hip up onto the bed to sit on edge of bed then lying down.  Patient able to return demonstration this technique and verbalized no knee pain with a change in technique.   PATIENT EDUCATION:  Education details: exam findings, POC, HEP  Person educated: Patient Education method: Explanation, Demonstration, and Handouts Education comprehension: verbalized understanding, returned demonstration, and needs further education  HOME EXERCISE PROGRAM: Access Code: [email or web address] URL: [email or web address] Date: 06/20/2023 Prepared by: Lamar Ivory  Exercises - Supine Bridge with Resistance Band  - 1-2 x daily - 7 x weekly - 1-2 sets - 10 reps - 5 second  hold - Clam with Resistance  - 1-2 x daily - 7 x weekly - 1-2 sets - 10 reps - 5 seconds hold - Seated Hamstring Stretch  - 1 x daily - 7 x weekly - 2 sets - 3 reps - 30 seconds  hold - Hooklying Active Hamstring Stretch  - 1-2 x daily - 7 x weekly - 1 sets - 4 reps - 15 seconds hold - Standing Quad Stretch with Towel and Arm Support  - 1-2 x daily - 7 x weekly - 1 sets - 3 reps - 3 deep breaths hold - Supine Figure 4 Piriformis Stretch  - 2 x daily - 7 x weekly - 1 sets - 4 reps - 15 seconds hold - Seated Hamstring Set  - 2 x daily - 7 x weekly - 1-2 sets - 10 reps - 5 seconds hold  ASSESSMENT:  CLINICAL IMPRESSION:   Arrived today feeling a bit discouraged as she has not noticed any significant changes in her pain. Can still do what she would like to but it hurts. Updated PSFS and MMT, otherwise kept working on strengthening and mobility to tolerance. Will continue progressions as tolerated. She has gained  a bit of strength but gradually, might benefit from anti-inflammatories from MD if they are agreeable.    EVAL: Patient is a 78 y.o. F who was seen today for physical therapy evaluation and treatment for  Diagnosis  M79.652 (ICD-10-CM) - Pain in left thigh  . Objectives as above. She does have some chronic issues with R quad, I suspect that this specific injury may have changed some biomechanics/walking pattern and potentially have contributed to high HS injury on the  left. Will make every effort to improve her pain and assist in return to PLOF, especially as she has a significant trip coming up in September.   OBJECTIVE IMPAIRMENTS: Abnormal gait, decreased activity tolerance, decreased balance, decreased mobility, difficulty walking, decreased ROM, decreased strength, increased fascial restrictions, increased muscle spasms, impaired flexibility, and pain.   ACTIVITY LIMITATIONS: sitting, standing, squatting, stairs, transfers, bed mobility, and locomotion level  PARTICIPATION LIMITATIONS: driving, shopping, community activity, occupation, and yard work  PERSONAL FACTORS: Age, Behavior pattern, Education, Fitness, Past/current experiences, Profession, and Time since onset of injury/illness/exacerbation are also affecting patient's functional outcome.   REHAB POTENTIAL: Good  CLINICAL DECISION MAKING: Stable/uncomplicated  EVALUATION COMPLEXITY: Low   GOALS: Goals reviewed with patient? Yes  SHORT TERM GOALS: Target date: 06/29/2023   Will be compliant with appropriate progressive HEP  Baseline: Goal status: Met 06/20/2023  2.  B HS flexibility to have returned to baseline  Baseline:  Goal status: Ongoing   06/29/23  3.  Pain to be no more than 3/10 at worst with functional tasks  Baseline:  Goal status: Ongoing   06/29/23 depends on the day and task   LONG TERM GOALS: Target date: 07/27/2023   MMT to have improved by one grade in all weak groups  Baseline:  Goal status:  Ongoing  06/29/23  2.  Pain to have resolved in L HS and R quad  Baseline:  Goal status: Ongoing   06/12/2023  3.  Will be able to participate in all desired leisure and sports activities without increase in pain  Baseline:  Goal status: Ongoing   06/12/2023  4.  PSFS to have improved by at least 3 points  Baseline:  Goal status: Ongoing   06/12/2023  5.  Will be able to ambulate community distances and navigate steps as desired without increase in pain R quad and L HS  Baseline:  Goal status:  Ongoing   06/12/2023     PLAN:  PT FREQUENCY: 2x/week  PT DURATION: 8 weeks  PLANNED INTERVENTIONS: 97750- Physical Performance Testing, 97110-Therapeutic exercises, 97530- Therapeutic activity, 97112- Neuromuscular re-education, 97535- Self Care, [id number]- Manual therapy, [id number]- Gait training, [id number]- Aquatic Therapy, [id number]- Vasopneumatic device, Taping, and Dry Needling  PLAN FOR NEXT SESSION: Gradual quadriceps, hamstrings and gluteal strength progressions.  Manual, percussion or DN as appropriate/desired.   Josette Rough, PT, DPT 06/29/23 10:58 AM

## 2023-07-03 ENCOUNTER — Ambulatory Visit: Admitting: Physical Therapy

## 2023-07-03 ENCOUNTER — Encounter: Payer: Self-pay | Admitting: Physical Therapy

## 2023-07-03 DIAGNOSIS — M79605 Pain in left leg: Secondary | ICD-10-CM | POA: Diagnosis not present

## 2023-07-03 DIAGNOSIS — R29898 Other symptoms and signs involving the musculoskeletal system: Secondary | ICD-10-CM

## 2023-07-03 DIAGNOSIS — M79604 Pain in right leg: Secondary | ICD-10-CM | POA: Diagnosis not present

## 2023-07-03 DIAGNOSIS — M6281 Muscle weakness (generalized): Secondary | ICD-10-CM | POA: Diagnosis not present

## 2023-07-03 NOTE — Therapy (Signed)
 OUTPATIENT PHYSICAL THERAPY LOWER EXTREMITY TREATMENT    Patient Name: Helen Swanson MRN: [id number] DOB:February 09, 1945, 78 y.o., female Today's Date: 07/03/2023  END OF SESSION:  PT End of Session - 07/03/23 0924     Visit Number 8    Number of Visits 17    Authorization Type BC[employer] MCR    Progress Note Due on Visit 10    PT Start Time 0930    PT Stop Time 1010    PT Time Calculation (min) 40 min    Activity Tolerance Patient tolerated treatment well;No increased pain    Behavior During Therapy WFL for tasks assessed/performed            Past Medical History:  Diagnosis Date   Emphysema of lung (HCC)    a. By CXR, nonsmoker.   Heart murmur    Hyperlipidemia    Hypotension    IC (interstitial cystitis)    Migraine    a. New onset 11/2013.   Mitral regurgitation    a. s/p MV repair 2012.   MVP (mitral valve prolapse)    a. s/p MV repair 2012.   Osteopenia    Osteoporosis    Pancreatitis    a. At time of MV repair.   PAT (paroxysmal atrial tachycardia) (HCC)    a. Brief during 11/2013 hospitalization.   Pre-diabetes    Shingles    Tachycardia    Takotsubo cardiomyopathy    a. 11/2013: Stress cardiomyopathy ([facility]) - EF 25% by cath, 15% by echo, elevated troponin felt due to this. Normal coronaries by cath 11/25/13.   Transaminitis    Tremor    Past Surgical History:  Procedure Laterality Date   DILATION AND CURETTAGE OF UTERUS     x2   ELBOW SURGERY     right elbow for tendonitis   LEFT HEART CATHETERIZATION WITH CORONARY ANGIOGRAM N/A 11/25/2013   Procedure: LEFT HEART CATHETERIZATION WITH CORONARY ANGIOGRAM;  Surgeon: Victory LELON Claudene DOUGLAS, MD;  Location: [facility] CATH LAB;  Service: Cardiovascular;  Laterality: N/A;   MITRAL VALVE REPAIR  1/12   Dr. Dusty   SHOULDER SURGERY  5/04   right   TOE SURGERY     TOTAL ABDOMINAL HYSTERECTOMY W/ BILATERAL SALPINGOOPHORECTOMY     WRIST SURGERY     left 10/06, right 7/07   Patient Active Problem List   Diagnosis Date  Noted   Hamstring strain, left, initial encounter 05/18/2023   Right bundle branch block 05/17/2021   Intractable migraine without aura and without status migrainosus 08/20/2014   Benign head tremor 08/20/2014   NSTEMI (non-ST elevated myocardial infarction) (HCC)    Emphysema lung (HCC) 11/26/2013   Stress-induced cardiomyopathy 11/25/2013   Elevated LFTs 11/25/2013   Hyperlipidemia 11/25/2013   History of mitral valve repair 11/20/2012   Heart murmur    Mitral regurgitation    Hypotension    Osteoporosis    Tachycardia     PCP: Theophilus Andrews, Tully MD   REFERRING PROVIDER: Persons, Ronal Dragon, PA  REFERRING DIAG:  Diagnosis  3477012865 (ICD-10-CM) - Pain in left thigh    THERAPY DIAG:  Pain in left leg  Pain in right leg  Muscle weakness (generalized)  Other symptoms and signs involving the musculoskeletal system  Rationale for Evaluation and Treatment: Rehabilitation  ONSET DATE: mid-April 2025  SUBJECTIVE:   SUBJECTIVE STATEMENT: Pt arriving today reporting 3-4/10 pain in her left hamstring, and Rt adductor   EVAL: This started hurting about mid-April 2025, the steps  at the beach really set it off badly. Was having a hard time sitting in the car to even ride to the beach, but going up the stairs at the beach condo caused the back of that leg to catch. If I hadn't been holding on on the steps, I would have fallen. The only time it doesn't hurt is when I'm in bed.   PERTINENT HISTORY: See above    PAIN:  Are you having pain? Yes: NPRS scale: 3-4/10 Pain location: L HS high up, right adductor Pain description: Aching, discomfort  Aggravating factors: left hamstring extended sitting sore then spikes when she gets up;  walking esp when reaching LLE forward, stairs, squatting     Right quad more vastus medialus & rectus: driving moving gas to break.  Getting into high bed lifting RLE Relieving factors: Straightening the leg  PRECAUTIONS: None  RED  FLAGS: None   WEIGHT BEARING RESTRICTIONS: No  FALLS:  Has patient fallen in last 6 months? No  LIVING ENVIRONMENT: Lives with: lives alone Lives in: House/apartment Stairs: No Has following equipment at home: None  OCCUPATION: retired- used to Associate Professor, also worked as a travel agency   PLOF: Independent, Independent with basic ADLs, Independent with gait, and Independent with transfers  PATIENT GOALS: address pain, be able to walk without hurting   NEXT MD VISIT:  Referring June 17th 2025  OBJECTIVE:  Note: Objective measures were completed at Evaluation unless otherwise noted.  DIAGNOSTIC FINDINGS:   Radiographs of her pelvis no acute fracture no significant degenerative  changes  PATIENT SURVEYS:    Patient-Specific Activity Scoring Scheme  0 represents "unable to perform." 10 represents "able to perform at prior level. 0 1 2 3 4 5 6 7 8 9  10 (Date and Score)   Activity Eval  06/29/23   1. Walking   8  8  2. Steps   8 8   3. Sitting  8 8  4.Bending over  8 8  5.    Score 8 I can do everything but it just hurts  8   Total score = sum of the activity scores/number of activities Minimum detectable change (90%CI) for average score = 2 points Minimum detectable change (90%CI) for single activity score = 3 points  COGNITION: Overall cognitive status: Within functional limits for tasks assessed    MUSCLE LENGTH: Hip flexors WNL B Quads mod limitation B HS severe limitation B; 06/08/23-  moderate limitation B but very guarded; 06/29/23 mod limitation B   Piriformis WNL B   PALPATION:  Tender at ischial tub L hip, otherwise HS not tender to palpation and no mm spasms noted, R anterior quad not tender to palpation but does relay some pain with active motions   Lumbar AROM (Lt/Rt in degrees): Extension 10 degrees; Lateral bending 30/40 degrees  Flexibility (Lt/Rt in degrees): Hip flexion 95/95 Hamstrings 40/35 Hip ER  30/23 Hip IR  10/8  LOWER  EXTREMITY MMT:  MMT Right eval Left eval Right 06/29/23 Left 06/29/23  Hip flexion 3- limited by chronic R quad pain  3- limited by HS pain  3- 3+  Hip extension 3 2+    Hip abduction 3+ 3- HS pain  3+ 3+  Hip adduction      Hip internal rotation      Hip external rotation      Knee flexion 3+ at best 3 at best painful  4 3+ at best painful   Knee extension 4+ 4 4  4+  Ankle dorsiflexion      Ankle plantarflexion      Ankle inversion      Ankle eversion       (Blank rows = not tested)    FUNCTIONAL TESTS:  5 times sit to stand: 18.7 seconds no UEs, difficulty with eccentric control and some posterior unsteadiness noted    TODAY'S TREATMENT NOTE  07/03/23:  TherEx:  Nustep: level 6 x 8 minutes Hip flexor stretch: supine leg over side of mat table Side lying reverse clams 2 x 10  Side lying hip circles both directions x 10  Supine heel slides beginning with hamstring set pt's heel on yellow ball 2 x 10 bil LE Prone hamstring curls 2#  2 x 10 bil  TherActivies:  Hip hiking 2 x 10 bil LE Side stepping green TB 20 feet x 2 bil directions Wall squats red physio-ball behind pt's back c close supervision   06/29/23  Nustep L6x8 minutes all four extremities seat 6  MMT and PSFS, discussion of progress with PT- discussed that she might benefit from anti-inflammatories pending MD opinion  Bridge walk outs x12 SLRs B x12  Supine hip ABD x12 B  Percussion gun L quad with towel for padding     06/26/23 Scifit bike L1; 3 min fwd, 3 min bwd Standing hip flexor/gastroc stretch x 30 Standing hamstring stretch with knee slightly bent 2x30 Seated piriformis stretch 2x30 Seated figure 4 stretch 2x30 Supine bridge walkouts 2x10 Supine quad set with towel under R knee with LE in slight abd 2x10 R only Supine SLR on L 2x10 Supine hip adductor stretch with strap on R 2x30 Supine hip abd 2x10 Supine glute set with heel press 2x10x3 Supine hamstring set with heel press  2x10x3 Standing L stretch 2x 30 Standing hip hinge into glute set 2x10 standing at counter                                                                                          PATIENT EDUCATION:  Education details: exam findings, POC, HEP  Person educated: Patient Education method: Programmer, multimedia, Facilities manager, and Handouts Education comprehension: verbalized understanding, returned demonstration, and needs further education  HOME EXERCISE PROGRAM: Access Code: [email or web address] URL: [email or web address] Date: 07/03/2023 Prepared by: Delon Lunger  Exercises - Supine Bridge with Resistance Band  - 1-2 x daily - 7 x weekly - 1-2 sets - 10 reps - 5 second  hold - Clam with Resistance  - 1-2 x daily - 7 x weekly - 1-2 sets - 10 reps - 5 seconds hold - Seated Hamstring Stretch  - 1 x daily - 7 x weekly - 2 sets - 3 reps - 30 seconds  hold - Hooklying Active Hamstring Stretch  - 1-2 x daily - 7 x weekly - 1 sets - 4 reps - 15 seconds hold - Standing Quad Stretch with Towel and Arm Support  - 1-2 x daily - 7 x weekly - 1 sets - 3 reps - 3 deep breaths hold - Supine Figure 4 Piriformis Stretch  - 2 x daily - 7 x weekly - 1 sets - 4  reps - 15 seconds hold - Seated Hamstring Set  - 2 x daily - 7 x weekly - 1-2 sets - 10 reps - 5 seconds hold - Hip Adductors and Hamstring Stretch with Strap  - 1 x daily - 7 x weekly - 2 sets - 30 sec hold - Standing 'L' Stretch at [employer]  - 1 x daily - 7 x weekly - 2 sets - 30 sec hold - Sidelying Reverse Clamshell  - 1 x daily - 7 x weekly - 2 sets - 10 reps - Sidelying Hip Circles  - 1 x daily - 7 x weekly - 10 reps - Standing Hip Hiking  - 1 x daily - 7 x weekly - 2 sets - 10 reps  ASSESSMENT:  CLINICAL IMPRESSION: Pt's HEP was updated and pt instructed to pick and chose 1/2 her exercises in the morning and the other 1/2 in the evening. Pt still reporting 3-4/10 pain in her left hip and Rt inner thigh (adductors). Pt reporting standing  still is not painful. Pt stating her pain hits when she takes a step. Pt able to tolerate exercises well and feels her strength is definitely improving. Recommending continued skilled PT interventions.    EVAL: Patient is a 78 y.o. F who was seen today for physical therapy evaluation and treatment for  Diagnosis  M79.652 (ICD-10-CM) - Pain in left thigh  . Objectives as above. She does have some chronic issues with R quad, I suspect that this specific injury may have changed some biomechanics/walking pattern and potentially have contributed to high HS injury on the left. Will make every effort to improve her pain and assist in return to PLOF, especially as she has a significant trip coming up in September.   OBJECTIVE IMPAIRMENTS: Abnormal gait, decreased activity tolerance, decreased balance, decreased mobility, difficulty walking, decreased ROM, decreased strength, increased fascial restrictions, increased muscle spasms, impaired flexibility, and pain.   ACTIVITY LIMITATIONS: sitting, standing, squatting, stairs, transfers, bed mobility, and locomotion level  PARTICIPATION LIMITATIONS: driving, shopping, community activity, occupation, and yard work  PERSONAL FACTORS: Age, Behavior pattern, Education, Fitness, Past/current experiences, Profession, and Time since onset of injury/illness/exacerbation are also affecting patient's functional outcome.   REHAB POTENTIAL: Good  CLINICAL DECISION MAKING: Stable/uncomplicated  EVALUATION COMPLEXITY: Low   GOALS: Goals reviewed with patient? Yes  SHORT TERM GOALS: Target date: 06/29/2023   Will be compliant with appropriate progressive HEP  Baseline: Goal status: Met 06/20/2023  2.  B HS flexibility to have returned to baseline  Baseline:  Goal status: Ongoing   06/29/23  3.  Pain to be no more than 3/10 at worst with functional tasks  Baseline:  Goal status: Ongoing   06/29/23 depends on the day and task   LONG TERM GOALS: Target date:  07/27/2023   MMT to have improved by one grade in all weak groups  Baseline:  Goal status: Ongoing  06/29/23  2.  Pain to have resolved in L HS and R quad  Baseline:  Goal status: Ongoing   06/12/2023  3.  Will be able to participate in all desired leisure and sports activities without increase in pain  Baseline:  Goal status: Ongoing   06/12/2023  4.  PSFS to have improved by at least 3 points  Baseline:  Goal status: Ongoing   06/12/2023  5.  Will be able to ambulate community distances and navigate steps as desired without increase in pain R quad and L HS  Baseline:  Goal  status:  Ongoing   06/12/2023     PLAN:  PT FREQUENCY: 2x/week  PT DURATION: 8 weeks  PLANNED INTERVENTIONS: 97750- Physical Performance Testing, 97110-Therapeutic exercises, 97530- Therapeutic activity, 97112- Neuromuscular re-education, 97535- Self Care, [id number]- Manual therapy, [id number]- Gait training, [phone]- Aquatic Therapy, [id number]- Vasopneumatic device, Taping, and Dry Needling  PLAN FOR NEXT SESSION: Gradual quadriceps, hamstrings and gluteal strength progressions.  Manual, percussion or DN as appropriate/desired. Prone hamstring curls, hip extension  Delon Lunger, PT, MPT 07/03/23 9:26 AM   07/03/23 9:26 AM

## 2023-07-05 ENCOUNTER — Ambulatory Visit: Admitting: Rehabilitative and Restorative Service Providers"

## 2023-07-05 ENCOUNTER — Encounter: Payer: Self-pay | Admitting: Rehabilitative and Restorative Service Providers"

## 2023-07-05 DIAGNOSIS — R29898 Other symptoms and signs involving the musculoskeletal system: Secondary | ICD-10-CM

## 2023-07-05 DIAGNOSIS — M79604 Pain in right leg: Secondary | ICD-10-CM

## 2023-07-05 DIAGNOSIS — M79605 Pain in left leg: Secondary | ICD-10-CM

## 2023-07-05 DIAGNOSIS — M6281 Muscle weakness (generalized): Secondary | ICD-10-CM

## 2023-07-05 NOTE — Therapy (Signed)
 OUTPATIENT PHYSICAL THERAPY LOWER EXTREMITY TREATMENT    Patient Name: Helen Swanson MRN: [id number] DOB:[birth year], 78 y.o., female Today's Date: 07/05/2023  END OF SESSION:  PT End of Session - 07/05/23 1105     Visit Number 9    Number of Visits 17    Authorization Type [employer] MCR    Progress Note Due on Visit 10    PT Start Time 1105    PT Stop Time 1145    PT Time Calculation (min) 40 min    Activity Tolerance Patient tolerated treatment well;No increased pain    Behavior During Therapy WFL for tasks assessed/performed             Past Medical History:  Diagnosis Date   Emphysema of lung (HCC)    a. By CXR, nonsmoker.   Heart murmur    Hyperlipidemia    Hypotension    IC (interstitial cystitis)    Migraine    a. New onset 11/2013.   Mitral regurgitation    a. s/p MV repair 2012.   MVP (mitral valve prolapse)    a. s/p MV repair 2012.   Osteopenia    Osteoporosis    Pancreatitis    a. At time of MV repair.   PAT (paroxysmal atrial tachycardia) (HCC)    a. Brief during 11/2013 hospitalization.   Pre-diabetes    Shingles    Tachycardia    Takotsubo cardiomyopathy    a. 11/2013: Stress cardiomyopathy ([facility]) - EF 25% by cath, 15% by echo, elevated troponin felt due to this. Normal coronaries by cath 11/25/13.   Transaminitis    Tremor    Past Surgical History:  Procedure Laterality Date   DILATION AND CURETTAGE OF UTERUS     x2   ELBOW SURGERY     right elbow for tendonitis   LEFT HEART CATHETERIZATION WITH CORONARY ANGIOGRAM N/A 11/25/2013   Procedure: LEFT HEART CATHETERIZATION WITH CORONARY ANGIOGRAM;  Surgeon: Victory LELON Claudene DOUGLAS, MD;  Location: [facility] CATH LAB;  Service: Cardiovascular;  Laterality: N/A;   MITRAL VALVE REPAIR  1/12   Dr. Dusty   SHOULDER SURGERY  5/04   right   TOE SURGERY     TOTAL ABDOMINAL HYSTERECTOMY W/ BILATERAL SALPINGOOPHORECTOMY     WRIST SURGERY     left 10/06, right 7/07   Patient Active Problem List   Diagnosis Date  Noted   Hamstring strain, left, initial encounter 05/18/2023   Right bundle branch block 05/17/2021   Intractable migraine without aura and without status migrainosus 08/20/2014   Benign head tremor 08/20/2014   NSTEMI (non-ST elevated myocardial infarction) (HCC)    Emphysema lung (HCC) 11/26/2013   Stress-induced cardiomyopathy 11/25/2013   Elevated LFTs 11/25/2013   Hyperlipidemia 11/25/2013   History of mitral valve repair 11/20/2012   Heart murmur    Mitral regurgitation    Hypotension    Osteoporosis    Tachycardia     PCP: Theophilus Andrews, Tully MD   REFERRING PROVIDER: Persons, Ronal Dragon, PA  REFERRING DIAG:  Diagnosis  [phone] (ICD-10-CM) - Pain in left thigh    THERAPY DIAG:  Pain in left leg  Pain in right leg  Muscle weakness (generalized)  Other symptoms and signs involving the musculoskeletal system  Rationale for Evaluation and Treatment: Rehabilitation  ONSET DATE: mid-April 2025  SUBJECTIVE:   SUBJECTIVE STATEMENT: [name] notes progress with her left hamstrings.  Her right quadriceps is also improving, although this is more irritating than her left hamstrings  at the current time.   EVAL: This started hurting about mid-April 2025, the steps at the beach really set it off badly. Was having a hard time sitting in the car to even ride to the beach, but going up the stairs at the beach condo caused the back of that leg to catch. If I hadn't been holding on on the steps, I would have fallen. The only time it doesn't hurt is when I'm in bed.   PERTINENT HISTORY: See above    PAIN:  Are you having pain? Yes: NPRS scale: 2-4/10 this week Pain location: L hamstrings and right quadriceps  Pain description: Aching, discomfort  Aggravating factors: left hamstring extended sitting sore then spikes when she gets up;  walking esp when reaching LLE forward, stairs, squatting     Right quad more vastus medialus & rectus: driving moving gas to brake.  Getting  into high bed lifting RLE Relieving factors: Straightening the leg  PRECAUTIONS: None  RED FLAGS: None   WEIGHT BEARING RESTRICTIONS: No  FALLS:  Has patient fallen in last 6 months? No  LIVING ENVIRONMENT: Lives with: lives alone Lives in: House/apartment Stairs: No Has following equipment at home: None  OCCUPATION: retired- used to Associate Professor, also worked as a travel agency   PLOF: Independent, Independent with basic ADLs, Independent with gait, and Independent with transfers  PATIENT GOALS: address pain, be able to walk without hurting   NEXT MD VISIT:  Referring June 17th 2025  OBJECTIVE:  Note: Objective measures were completed at Evaluation unless otherwise noted.  DIAGNOSTIC FINDINGS:   Radiographs of her pelvis no acute fracture no significant degenerative  changes  PATIENT SURVEYS:    Patient-Specific Activity Scoring Scheme  0 represents "unable to perform." 10 represents "able to perform at prior level. 0 1 2 3 4 5 6 7 8 9  10 (Date and Score)   Activity Eval  06/29/23   1. Walking   8  8  2. Steps   8 8   3. Sitting  8 8  4.Bending over  8 8  5.    Score 8 I can do everything but it just hurts  8   Total score = sum of the activity scores/number of activities Minimum detectable change (90%CI) for average score = 2 points Minimum detectable change (90%CI) for single activity score = 3 points  COGNITION: Overall cognitive status: Within functional limits for tasks assessed    MUSCLE LENGTH: Hip flexors WNL B Quads mod limitation B HS severe limitation B; 06/08/23-  moderate limitation B but very guarded; 06/29/23 mod limitation B   Piriformis WNL B   PALPATION:  Tender at ischial tub L hip, otherwise HS not tender to palpation and no mm spasms noted, R anterior quad not tender to palpation but does relay some pain with active motions   Lumbar AROM (Lt/Rt in degrees): Extension 10 degrees; Lateral bending 30/40 degrees  Flexibility  (Lt/Rt in degrees):   06/20/2023  07/05/2023 Hip flexion 95/95   105/105 Hamstrings 40/35   45/45 Hip ER  30/23   43/41 Hip IR  10/8   12/3  LOWER EXTREMITY MMT:  MMT Right eval Left eval Right 06/29/23 Left 06/29/23  Hip flexion 3- limited by chronic R quad pain  3- limited by HS pain  3- 3+  Hip extension 3 2+    Hip abduction 3+ 3- HS pain  3+ 3+  Hip adduction      Hip internal rotation  Hip external rotation      Knee flexion 3+ at best 3 at best painful  4 3+ at best painful   Knee extension 4+ 4 4 4+  Ankle dorsiflexion      Ankle plantarflexion      Ankle inversion      Ankle eversion       (Blank rows = not tested)    FUNCTIONAL TESTS:  5 times sit to stand: 18.7 seconds no UEs, difficulty with eccentric control and some posterior unsteadiness noted    TODAY'S TREATMENT NOTE  07/05/2023 Yoga Bridge 10 x 5 seconds; 5 x 10 seconds and 10 x with 2 step march and down Supine hamstrings stretch with other leg straight 4 x 20 seconds Figure 4 stretch with push 4 x 20 seconds Prone alternating hip extension 2 sets of 10 x 3 seconds Hip hike 10 x 3 seconds  02464: Reviewed objective measures, updated home exercises and discussed the importance of continued strength work for both quadriceps and hamstrings to meet all long-term goals  07/03/23:  TherEx:  Nustep: level 6 x 8 minutes Hip flexor stretch: supine leg over side of mat table Side lying reverse clams 2 x 10  Side lying hip circles both directions x 10  Supine heel slides beginning with hamstring set pt's heel on yellow ball 2 x 10 bil LE Prone hamstring curls 2#  2 x 10 bil  TherActivies:  Hip hiking 2 x 10 bil LE Side stepping green TB 20 feet x 2 bil directions Wall squats red physio-ball behind pt's back c close supervision   06/29/23 Nustep L6x8 minutes all four extremities seat 6  MMT and PSFS, discussion of progress with PT- discussed that she might benefit from anti-inflammatories pending MD  opinion  Bridge walk outs x12 SLRs B x12  Supine hip ABD x12 B  Percussion gun L quad with towel for padding                                             PATIENT EDUCATION:  Education details: exam findings, POC, HEP  Person educated: Patient Education method: Programmer, multimedia, Demonstration, and Handouts Education comprehension: verbalized understanding, returned demonstration, and needs further education  HOME EXERCISE PROGRAM: Access Code: [email or web address] URL: [email or web address] Date: 07/05/2023 Prepared by: Lamar Ivory  Exercises - Supine Bridge with Resistance Band  - 1 x daily - 7 x weekly - 1 sets - 10 reps - 5 second  hold - Clam with Resistance  - 1-2 x daily - 7 x weekly - 1-2 sets - 10 reps - 5 seconds hold - Standing Quad Stretch with Towel and Arm Support  - 1-2 x daily - 7 x weekly - 1 sets - 3 reps - 3 deep breaths hold - Supine Figure 4 Piriformis Stretch  - 1 x daily - 7 x weekly - 1 sets - 4 reps - 15 seconds hold - Hip Adductors and Hamstring Stretch with Strap  - 1 x daily - 7 x weekly - 2 sets - 30 sec hold - Standing 'L' Stretch at Counter  - 1 x daily - 7 x weekly - 2 sets - 30 sec hold - Sidelying Reverse Clamshell  - 1 x daily - 7 x weekly - 2 sets - 10 reps - Sidelying Hip Circles  - 1 x daily - 7 x weekly -  10 reps - Supine Hamstring Stretch  - 2 x daily - 7 x weekly - 1 sets - 5 reps - 20 seconds hold - Prone Hip Extension  - 1 x daily - 7 x weekly - 2 sets - 10 reps - 3 seconds hold - Standing Hip Hiking  - 3 x daily - 7 x weekly - 1 sets - 10 reps - 3 seconds hold  ASSESSMENT:  CLINICAL IMPRESSION: Jenkins notices significant progress with her left hamstrings pain since starting physical therapy.  Her right quadriceps symptoms are also improving, although at a slower pace.  [name] and I discussed her excellent flexibility progress and I recommended a strength reassessment next week along with appropriate progressions as needed to meet all long-term  goals.  I anticipate Jenkins will meet long-term goals with consistent home exercise program compliance and she might benefit from a bit more supervised physical therapy.  We will make this recommendation after strength testing next week if it appears she will benefit from additional supervised physical therapy.  EVAL: Patient is a 78 y.o. F who was seen today for physical therapy evaluation and treatment for  Diagnosis  M79.652 (ICD-10-CM) - Pain in left thigh  . Objectives as above. She does have some chronic issues with R quad, I suspect that this specific injury may have changed some biomechanics/walking pattern and potentially have contributed to high HS injury on the left. Will make every effort to improve her pain and assist in return to PLOF, especially as she has a significant trip coming up in September.   OBJECTIVE IMPAIRMENTS: Abnormal gait, decreased activity tolerance, decreased balance, decreased mobility, difficulty walking, decreased ROM, decreased strength, increased fascial restrictions, increased muscle spasms, impaired flexibility, and pain.   ACTIVITY LIMITATIONS: sitting, standing, squatting, stairs, transfers, bed mobility, and locomotion level  PARTICIPATION LIMITATIONS: driving, shopping, community activity, occupation, and yard work  PERSONAL FACTORS: Age, Behavior pattern, Education, Fitness, Past/current experiences, Profession, and Time since onset of injury/illness/exacerbation are also affecting patient's functional outcome.   REHAB POTENTIAL: Good  CLINICAL DECISION MAKING: Stable/uncomplicated  EVALUATION COMPLEXITY: Low   GOALS: Goals reviewed with patient? Yes  SHORT TERM GOALS: Target date: 06/29/2023   Will be compliant with appropriate progressive HEP  Baseline: Goal status: Met 06/20/2023  2.  B HS flexibility to have returned to baseline  Baseline:  Goal status: Met 07/05/2023  3.  Pain to be no more than 3/10 at worst with functional tasks   Baseline:  Goal status: Partially met 07/05/2023  LONG TERM GOALS: Target date: 07/27/2023   MMT to have improved by one grade in all weak groups  Baseline:  Goal status: Ongoing  06/29/23  2.  Pain to have resolved in L HS and R quad  Baseline:  Goal status: Partially met 07/05/2023  3.  Will be able to participate in all desired leisure and sports activities without increase in pain  Baseline:  Goal status: Ongoing   07/05/2023  4.  PSFS to have improved by at least 3 points  Baseline:  Goal status: Ongoing   06/12/2023  5.  Will be able to ambulate community distances and navigate steps as desired without increase in pain R quad and L HS  Baseline:  Goal status:  Ongoing   07/05/2023     PLAN:  PT FREQUENCY: 2x/week  PT DURATION: 8 weeks  PLANNED INTERVENTIONS: 97750- Physical Performance Testing, 97110-Therapeutic exercises, 97530- Therapeutic activity, [id number]- Neuromuscular re-education, 97535- Self Care, 02859- Manual therapy, 02883-  Gait training, 02886- Aquatic Therapy, [id number]- Vasopneumatic device, Taping, and Dry Needling  PLAN FOR NEXT SESSION: Gradual quadriceps, hamstrings and gluteal strength progressions.  Patient specific functional score and strength test update.  Jenkins is making progress but will benefit from another 3 weeks of supervised physical therapy.  Myer LELON Ivory, PT, MPT 07/05/23 5:49 PM   07/05/23 5:49 PM

## 2023-07-10 ENCOUNTER — Ambulatory Visit: Admitting: Physical Therapy

## 2023-07-10 ENCOUNTER — Encounter: Payer: Self-pay | Admitting: Physical Therapy

## 2023-07-10 DIAGNOSIS — M6281 Muscle weakness (generalized): Secondary | ICD-10-CM | POA: Diagnosis not present

## 2023-07-10 DIAGNOSIS — M79605 Pain in left leg: Secondary | ICD-10-CM | POA: Diagnosis not present

## 2023-07-10 DIAGNOSIS — R29898 Other symptoms and signs involving the musculoskeletal system: Secondary | ICD-10-CM

## 2023-07-10 DIAGNOSIS — M79604 Pain in right leg: Secondary | ICD-10-CM | POA: Diagnosis not present

## 2023-07-10 NOTE — Therapy (Signed)
 OUTPATIENT PHYSICAL THERAPY LOWER EXTREMITY TREATMENT & PROGRESS NOTE   Patient Name: Helen Swanson MRN: [id number] DOB:June 15, 1945, 78 y.o., female Today's Date: 07/10/2023 Progress Note Reporting Period 06/01/2023 to 07/10/2023  See note below for Objective Data and Assessment of Progress/Goals.    END OF SESSION:  PT End of Session - 07/10/23 1013     Visit Number 10    Number of Visits 17    Authorization Type [employer] MCR    Progress Note Due on Visit 20    PT Start Time 1014    PT Stop Time 1101    PT Time Calculation (min) 47 min    Activity Tolerance Patient tolerated treatment well;No increased pain    Behavior During Therapy WFL for tasks assessed/performed           Past Medical History:  Diagnosis Date   Emphysema of lung (HCC)    a. By CXR, nonsmoker.   Heart murmur    Hyperlipidemia    Hypotension    IC (interstitial cystitis)    Migraine    a. New onset 11/2013.   Mitral regurgitation    a. s/p MV repair 2012.   MVP (mitral valve prolapse)    a. s/p MV repair 2012.   Osteopenia    Osteoporosis    Pancreatitis    a. At time of MV repair.   PAT (paroxysmal atrial tachycardia) (HCC)    a. Brief during 11/2013 hospitalization.   Pre-diabetes    Shingles    Tachycardia    Takotsubo cardiomyopathy    a. 11/2013: Stress cardiomyopathy ([facility]ICM) - EF 25% by cath, 15% by echo, elevated troponin felt due to this. Normal coronaries by cath 11/25/13.   Transaminitis    Tremor    Past Surgical History:  Procedure Laterality Date   DILATION AND CURETTAGE OF UTERUS     x2   ELBOW SURGERY     right elbow for tendonitis   LEFT HEART CATHETERIZATION WITH CORONARY ANGIOGRAM N/A 11/25/2013   Procedure: LEFT HEART CATHETERIZATION WITH CORONARY ANGIOGRAM;  Surgeon: Victory LELON Claudene DOUGLAS, MD;  Location: [facility] CATH LAB;  Service: Cardiovascular;  Laterality: N/A;   MITRAL VALVE REPAIR  1/12   Dr. Dusty   SHOULDER SURGERY  5/04   right   TOE SURGERY     TOTAL ABDOMINAL  HYSTERECTOMY W/ BILATERAL SALPINGOOPHORECTOMY     WRIST SURGERY     left 10/06, right 7/07   Patient Active Problem List   Diagnosis Date Noted   Hamstring strain, left, initial encounter 05/18/2023   Right bundle branch block 05/17/2021   Intractable migraine without aura and without status migrainosus 08/20/2014   Benign head tremor 08/20/2014   NSTEMI (non-ST elevated myocardial infarction) (HCC)    Emphysema lung (HCC) 11/26/2013   Stress-induced cardiomyopathy 11/25/2013   Elevated LFTs 11/25/2013   Hyperlipidemia 11/25/2013   History of mitral valve repair 11/20/2012   Heart murmur    Mitral regurgitation    Hypotension    Osteoporosis    Tachycardia     PCP: Theophilus Andrews, Tully MD   REFERRING PROVIDER: Persons, Ronal Dragon, PA  REFERRING DIAG:  Diagnosis  [phone] (ICD-10-CM) - Pain in left thigh    THERAPY DIAG:  Pain in left leg  Pain in right leg  Muscle weakness (generalized)  Other symptoms and signs involving the musculoskeletal system  Rationale for Evaluation and Treatment: Rehabilitation  ONSET DATE: mid-April 2025  SUBJECTIVE:   SUBJECTIVE STATEMENT: Left hamstring was  better but yesterday walked in grocery store for 30 min. She felt it more than has been feeling but not bad.  Right quad is still bothering her but not as bad as initially.  Getting in/out of car hurts and gas/brake hurts her.    EVAL: This started hurting about mid-April 2025, the steps at the beach really set it off badly. Was having a hard time sitting in the car to even ride to the beach, but going up the stairs at the beach condo caused the back of that leg to catch. If I hadn't been holding on on the steps, I would have fallen. The only time it doesn't hurt is when I'm in bed.   PERTINENT HISTORY: See above    PAIN:  Are you having pain? Yes: NPRS scale:  see below for last week  Pain location: L hamstrings 0-4/10  and right quadriceps 2-4/10 Pain description: Aching,  discomfort  Aggravating factors: left hamstring extended sitting sore then spikes when she gets up;  walking esp when reaching LLE forward, stairs, squatting     Right quad more vastus medialus & rectus: driving moving gas to brake.  Getting into high bed lifting RLE Relieving factors: Straightening the leg  PRECAUTIONS: None  RED FLAGS: None   WEIGHT BEARING RESTRICTIONS: No  FALLS:  Has patient fallen in last 6 months? No  LIVING ENVIRONMENT: Lives with: lives alone Lives in: House/apartment Stairs: No Has following equipment at home: None  OCCUPATION: retired- used to Associate Professor, also worked as a travel agency   PLOF: Independent, Independent with basic ADLs, Independent with gait, and Independent with transfers  PATIENT GOALS: address pain, be able to walk without hurting   NEXT MD VISIT:  Referring June 17th 2025  OBJECTIVE:  Note: Objective measures were completed at Evaluation unless otherwise noted.  DIAGNOSTIC FINDINGS:   Radiographs of her pelvis no acute fracture no significant degenerative  changes  PATIENT SURVEYS:    Patient-Specific Activity Scoring Scheme  0 represents "unable to perform." 10 represents "able to perform at prior level. 0 1 2 3 4 5 6 7 8 9  10 (Date and Score)   Activity Eval  06/29/23   1. Walking   8  8  2. Steps   8 8   3. Sitting  8 8  4.Bending over  8 8  5.    Score 8 I can do everything but it just hurts  8   Total score = sum of the activity scores/number of activities Minimum detectable change (90%CI) for average score = 2 points Minimum detectable change (90%CI) for single activity score = 3 points  COGNITION: Overall cognitive status: Within functional limits for tasks assessed    MUSCLE LENGTH: Hip flexors WNL B Quads mod limitation B HS severe limitation B; 06/08/23-  moderate limitation B but very guarded; 06/29/23 mod limitation B   Piriformis WNL B   PALPATION:  Tender at ischial tub L hip, otherwise  HS not tender to palpation and no mm spasms noted, R anterior quad not tender to palpation but does relay some pain with active motions   Lumbar AROM (Lt/Rt in degrees): Extension 10 degrees; Lateral bending 30/40 degrees  Flexibility (Lt/Rt in degrees):   06/20/2023  07/05/2023 Hip flexion 95/95   105/105 Hamstrings 40/35   45/45 Hip ER  30/23   43/41 Hip IR  10/8   12/3  LOWER EXTREMITY MMT:  MMT Right eval Left eval Right 06/29/23 Left 06/29/23 Left  07/10/23 Right 07/10/23  Hip flexion 3- limited by chronic R quad pain  3- limited by HS pain  3- 3+    Hip extension 3 2+      Hip abduction 3+ 3- HS pain  3+ 3+    Hip adduction        Hip internal rotation        Hip external rotation        Knee flexion 3+ at best 3 at best painful  4 3+ at best painful  Seated  HH dynameter 5.6#, 4.6# LLE:RLE 49% Seated  HH dynameter 10.7#, 9.9#  Knee extension 4+ 4 4 4+ HH dynamter 27.3#,  28.2# HH dynameter 23#, 23.5# RLE:LLE 84%  Ankle dorsiflexion        Ankle plantarflexion        Ankle inversion        Ankle eversion         (Blank rows = not tested)   FUNCTIONAL TESTS:  5 times sit to stand: 18.7 seconds no UEs, difficulty with eccentric control and some posterior unsteadiness noted    TODAY'S TREATMENT NOTE  07/10/2023: Therapeutic Exercise:  Nustep: level 6 x 8 minutes - 5 min with BLEs / BUEs & 3 min BLEs only.  Bridge with red band resistance 10 reps 5 sec.  PT cues on technique. Clam shell with red band resistance 10 reps on ea LE.   PT cues on technique. Left hamstring stretch 30 sec hold first 3 reps thigh vertical with knee extending;  2 reps with strap SLR & abducted position;  2 reps with strap SLR & adducted position;   Prone left hamstring: 5 reps straight knee flexion; then 10 reps knee flexion with controlled IR / ER.  Hip flexor stretch: supine leg over side of mat table Seated rt LAQ with red band resistance 10 reps See objective data for [facility] dynameter strength  testing.     07/05/2023 Yoga Bridge 10 x 5 seconds; 5 x 10 seconds and 10 x with 2 step march and down Supine hamstrings stretch with other leg straight 4 x 20 seconds Figure 4 stretch with push 4 x 20 seconds Prone alternating hip extension 2 sets of 10 x 3 seconds Hip hike 10 x 3 seconds  [id number]: Reviewed objective measures, updated home exercises and discussed the importance of continued strength work for both quadriceps and hamstrings to meet all long-term goals  07/03/23:  TherEx:  Nustep: level 6 x 8 minutes Hip flexor stretch: supine leg over side of mat table Side lying reverse clams 2 x 10  Side lying hip circles both directions x 10  Supine heel slides beginning with hamstring set pt's heel on yellow ball 2 x 10 bil LE Prone hamstring curls 2#  2 x 10 bil  TherActivies:  Hip hiking 2 x 10 bil LE Side stepping green TB 20 feet x 2 bil directions Wall squats red physio-ball behind pt's back c close supervision                                            PATIENT EDUCATION:  Education details: exam findings, POC, HEP  Person educated: Patient Education method: Explanation, Demonstration, and Handouts Education comprehension: verbalized understanding, returned demonstration, and needs further education  HOME EXERCISE PROGRAM: Access Code: [email or web address] URL: [email or web address] Date: 07/10/2023 Prepared by: Grayce Spatz  Exercises - Supine Bridge with Resistance Band  - 1 x daily - 7 x weekly - 1 sets - 10 reps - 5 second  hold - Clam with Resistance  - 1-2 x daily - 7 x weekly - 1-2 sets - 10 reps - 5 seconds hold - Standing Quad Stretch with Towel and Arm Support  - 1-2 x daily - 7 x weekly - 1 sets - 3 reps - 3 deep breaths hold - Supine Figure 4 Piriformis Stretch  - 1 x daily - 7 x weekly - 1 sets - 4 reps - 15 seconds hold - Supine Hamstring Stretch  - 2 x daily - 7 x weekly - 1 sets - 5 reps - 20 seconds hold - Hip Adductors and Hamstring Stretch with  Strap  - 1 x daily - 7 x weekly - 2 sets - 30 sec hold - Standing 'L' Stretch at Counter  - 1 x daily - 7 x weekly - 2 sets - 30 sec hold - Sidelying Reverse Clamshell  - 1 x daily - 7 x weekly - 2 sets - 10 reps - Sidelying Hip Circles  - 1 x daily - 7 x weekly - 10 reps - Standing Hip Hiking  - 3 x daily - 7 x weekly - 1 sets - 10 reps - 3 seconds hold - Prone Knee Flexion  - 1 x daily - 7 x weekly - 2 sets - 10 reps - 5 seconds hold - Prone Hip Internal and External Rotation AROM  - 1 x daily - 7 x weekly - 2 sets - 10 reps - 5 seconds hold - Prone Hip Extension with Bent Knee  - 1 x daily - 7 x weekly - 2 sets - 10 reps - 3 seconds hold  ASSESSMENT:  CLINICAL IMPRESSION: Patient reports improvement in left hamstring and right quad pain.  It does return with certain activities but no where near initial pain.  Patient appears to understand updated HEP which is addressing muscle flexibility & strength. She continues to benefit from skilled PT.   OBJECTIVE IMPAIRMENTS: Abnormal gait, decreased activity tolerance, decreased balance, decreased mobility, difficulty walking, decreased ROM, decreased strength, increased fascial restrictions, increased muscle spasms, impaired flexibility, and pain.   ACTIVITY LIMITATIONS: sitting, standing, squatting, stairs, transfers, bed mobility, and locomotion level  PARTICIPATION LIMITATIONS: driving, shopping, community activity, occupation, and yard work  PERSONAL FACTORS: Age, Behavior pattern, Education, Fitness, Past/current experiences, Profession, and Time since onset of injury/illness/exacerbation are also affecting patient's functional outcome.   REHAB POTENTIAL: Good  CLINICAL DECISION MAKING: Stable/uncomplicated  EVALUATION COMPLEXITY: Low   GOALS: Goals reviewed with patient? Yes  SHORT TERM GOALS: Target date: 06/29/2023   Will be compliant with appropriate progressive HEP  Baseline: Goal status: Met 06/20/2023  2.  B HS flexibility  to have returned to baseline  Baseline:  Goal status: Met 07/05/2023  3.  Pain to be no more than 3/10 at worst with functional tasks  Baseline:  Goal status: Partially met 07/05/2023  LONG TERM GOALS: Target date: 07/27/2023   MMT to have improved by one grade in all weak groups  Baseline:  Goal status: Ongoing   07/10/2023  2.  Pain to have resolved in L HS and R quad  Baseline:  Goal status: Partially met 07/05/2023  3.  Will be able to participate in all desired leisure and sports activities without increase in pain  Baseline:  Goal status: Ongoing    07/10/2023  4.  PSFS to have improved by at least 3 points  Baseline:  Goal status: Ongoing    07/10/2023  5.  Will be able to ambulate community distances and navigate steps as desired without increase in pain R quad and L HS  Baseline:  Goal status:  Ongoing   07/10/2023     PLAN:  PT FREQUENCY: 2x/week  PT DURATION: 8 weeks  PLANNED INTERVENTIONS: 97750- Physical Performance Testing, 97110-Therapeutic exercises, 97530- Therapeutic activity, 97112- Neuromuscular re-education, 97535- Self Care, [id number]- Manual therapy, [id number]- Gait training, [id number]- Aquatic Therapy, [id number]- Vasopneumatic device, Taping, and Dry Needling  PLAN FOR NEXT SESSION: continue with quadriceps, hamstrings and gluteal strength progressions.  Continue to update & progress HEP.    Grayce Spatz, PT, DPT 07/10/2023, 11:34 AM

## 2023-07-12 ENCOUNTER — Ambulatory Visit: Admitting: Rehabilitative and Restorative Service Providers"

## 2023-07-12 ENCOUNTER — Encounter: Payer: Self-pay | Admitting: Rehabilitative and Restorative Service Providers"

## 2023-07-12 DIAGNOSIS — M79605 Pain in left leg: Secondary | ICD-10-CM

## 2023-07-12 DIAGNOSIS — M79604 Pain in right leg: Secondary | ICD-10-CM | POA: Diagnosis not present

## 2023-07-12 DIAGNOSIS — R29898 Other symptoms and signs involving the musculoskeletal system: Secondary | ICD-10-CM

## 2023-07-12 DIAGNOSIS — M6281 Muscle weakness (generalized): Secondary | ICD-10-CM | POA: Diagnosis not present

## 2023-07-12 NOTE — Therapy (Signed)
 OUTPATIENT PHYSICAL THERAPY LOWER EXTREMITY TREATMENT NOTE   Patient Name: Helen Swanson MRN: [id number] DOB:November 28, 1945, 78 y.o., female Today's Date: 07/12/2023  END OF SESSION:  PT End of Session - 07/12/23 1142     Visit Number 11    Number of Visits 17    Authorization Type BCBS MCR    Progress Note Due on Visit 20    PT Start Time 1020    PT Stop Time 1102    PT Time Calculation (min) 42 min    Activity Tolerance Patient tolerated treatment well;No increased pain    Behavior During Therapy WFL for tasks assessed/performed            Past Medical History:  Diagnosis Date   Emphysema of lung (HCC)    a. By CXR, nonsmoker.   Heart murmur    Hyperlipidemia    Hypotension    IC (interstitial cystitis)    Migraine    a. New onset 11/2013.   Mitral regurgitation    a. s/p MV repair 2012.   MVP (mitral valve prolapse)    a. s/p MV repair 2012.   Osteopenia    Osteoporosis    Pancreatitis    a. At time of MV repair.   PAT (paroxysmal atrial tachycardia) (HCC)    a. Brief during 11/2013 hospitalization.   Pre-diabetes    Shingles    Tachycardia    Takotsubo cardiomyopathy    a. 11/2013: Stress cardiomyopathy (NICM) - EF 25% by cath, 15% by echo, elevated troponin felt due to this. Normal coronaries by cath 11/25/13.   Transaminitis    Tremor    Past Surgical History:  Procedure Laterality Date   DILATION AND CURETTAGE OF UTERUS     x2   ELBOW SURGERY     right elbow for tendonitis   LEFT HEART CATHETERIZATION WITH CORONARY ANGIOGRAM N/A 11/25/2013   Procedure: LEFT HEART CATHETERIZATION WITH CORONARY ANGIOGRAM;  Surgeon: Victory LELON Claudene DOUGLAS, MD;  Location: [facility] CATH LAB;  Service: Cardiovascular;  Laterality: N/A;   MITRAL VALVE REPAIR  1/12   Dr. Dusty   SHOULDER SURGERY  5/04   right   TOE SURGERY     TOTAL ABDOMINAL HYSTERECTOMY W/ BILATERAL SALPINGOOPHORECTOMY     WRIST SURGERY     left 10/06, right 7/07   Patient Active Problem List   Diagnosis  Date Noted   Hamstring strain, left, initial encounter 05/18/2023   Right bundle branch block 05/17/2021   Intractable migraine without aura and without status migrainosus 08/20/2014   Benign head tremor 08/20/2014   NSTEMI (non-ST elevated myocardial infarction) (HCC)    Emphysema lung (HCC) 11/26/2013   Stress-induced cardiomyopathy 11/25/2013   Elevated LFTs 11/25/2013   Hyperlipidemia 11/25/2013   History of mitral valve repair 11/20/2012   Heart murmur    Mitral regurgitation    Hypotension    Osteoporosis    Tachycardia     PCP: Theophilus Andrews, Tully MD   REFERRING PROVIDER: Persons, Ronal Dragon, PA  REFERRING DIAG:  Diagnosis  [phone] (ICD-10-CM) - Pain in left thigh    THERAPY DIAG:  Pain in left leg  Pain in right leg  Muscle weakness (generalized)  Other symptoms and signs involving the musculoskeletal system  Rationale for Evaluation and Treatment: Rehabilitation  ONSET DATE: mid-April 2025  SUBJECTIVE:   SUBJECTIVE STATEMENT: Jenkins does great in the mornings.  The left hamstrings does bother her with walking and the right quadriceps with driving and walking.  Both have improved since evaluation, but are not where she would like to be.  EVAL: This started hurting about mid-April 2025, the steps at the beach really set it off badly. Was having a hard time sitting in the car to even ride to the beach, but going up the stairs at the beach condo caused the back of that leg to catch. If I hadn't been holding on on the steps, I would have fallen. The only time it doesn't hurt is when I'm in bed.  PERTINENT HISTORY: See above    PAIN:  Are you having pain? Yes: NPRS scale:  see below for last week  Pain location: L hamstrings 0-3/10  and right quadriceps 0-3/10 Pain description: Aching, discomfort  Aggravating factors: left hamstring with walking and right quad with walking and car transfers and gas to brake, extended sitting sore then spikes when she gets  up;  walking esp when reaching LLE forward, stairs, squatting     Right quad more vastus medialus & rectus: driving moving gas to brake.  Getting into high bed lifting RLE Relieving factors: Straightening the leg  PRECAUTIONS: None  RED FLAGS: None   WEIGHT BEARING RESTRICTIONS: No  FALLS:  Has patient fallen in last 6 months? No  LIVING ENVIRONMENT: Lives with: lives alone Lives in: House/apartment Stairs: No Has following equipment at home: None  OCCUPATION: retired- used to Associate Professor, also worked as a travel agency   PLOF: Independent, Independent with basic ADLs, Independent with gait, and Independent with transfers  PATIENT GOALS: address pain, be able to walk without hurting   NEXT MD VISIT:  Referring June 17th 2025  OBJECTIVE:  Note: Objective measures were completed at Evaluation unless otherwise noted.  DIAGNOSTIC FINDINGS:   Radiographs of her pelvis no acute fracture no significant degenerative  changes  PATIENT SURVEYS:    Patient-Specific Activity Scoring Scheme  0 represents "unable to perform." 10 represents "able to perform at prior level. 0 1 2 3 4 5 6 7 8 9  10 (Date and Score)   Activity Eval  06/29/23   1. Walking   8  8  2. Steps   8 8   3. Sitting  8 8  4.Bending over  8 8  5.    Score 8 I can do everything but it just hurts  8   Total score = sum of the activity scores/number of activities Minimum detectable change (90%CI) for average score = 2 points Minimum detectable change (90%CI) for single activity score = 3 points  COGNITION: Overall cognitive status: Within functional limits for tasks assessed    MUSCLE LENGTH: Hip flexors WNL B Quads mod limitation B HS severe limitation B; 06/08/23-  moderate limitation B but very guarded; 06/29/23 mod limitation B   Piriformis WNL B   PALPATION:  Tender at ischial tub L hip, otherwise HS not tender to palpation and no mm spasms noted, R anterior quad not tender to palpation but  does relay some pain with active motions   Lumbar AROM (Lt/Rt in degrees): Extension 10 degrees; Lateral bending 30/40 degrees  Flexibility (Lt/Rt in degrees):   06/20/2023  07/05/2023 Hip flexion 95/95   105/105 Hamstrings 40/35   45/45 Hip ER  30/23   43/41 Hip IR  10/8   12/3  LOWER EXTREMITY MMT:  MMT Right eval Left eval Right 06/29/23 Left 06/29/23 Left 07/10/23 Right 07/10/23  Hip flexion 3- limited by chronic R quad pain  3- limited by HS pain  3- 3+    Hip extension 3 2+      Hip abduction 3+ 3- HS pain  3+ 3+    Hip adduction        Hip internal rotation        Hip external rotation        Knee flexion 3+ at best 3 at best painful  4 3+ at best painful  Seated  HH dynameter 5.6#, 4.6# LLE:RLE 49% Seated  HH dynameter 10.7#, 9.9#  Knee extension 4+ 4 4 4+ HH dynamter 27.3#,  28.2# HH dynameter 23#, 23.5# RLE:LLE 84%  Ankle dorsiflexion        Ankle plantarflexion        Ankle inversion        Ankle eversion         (Blank rows = not tested)   FUNCTIONAL TESTS:  5 times sit to stand: 18.7 seconds no UEs, difficulty with eccentric control and some posterior unsteadiness noted    TODAY'S TREATMENT NOTE  07/12/2023 NuStep Level 6 for 8 minutes legs only Prone hamstrings curls with Red Thera-Band 10 x 3 seconds left only slow eccentrics Prone alternating hip extension 2 sets of 10 for 3 seconds Seated straight leg raises 3 sets of 5 for 3 seconds  02464: Extensive review of home exercise program with particular emphasis on 5 exercises, 3 exercises to be completed 3 times a week and review of other exercises to be done as needed   07/10/2023: Therapeutic Exercise:  Nustep: level 6 x 8 minutes - 5 min with BLEs / BUEs & 3 min BLEs only.  Bridge with red band resistance 10 reps 5 sec.  PT cues on technique. Clam shell with red band resistance 10 reps on ea LE.   PT cues on technique. Left hamstring stretch 30 sec hold first 3 reps thigh vertical with knee  extending;  2 reps with strap SLR & abducted position;  2 reps with strap SLR & adducted position;   Prone left hamstring: 5 reps straight knee flexion; then 10 reps knee flexion with controlled IR / ER.  Hip flexor stretch: supine leg over side of mat table Seated rt LAQ with red band resistance 10 reps See objective data for [facility] dynameter strength testing.     07/05/2023 Yoga Bridge 10 x 5 seconds; 5 x 10 seconds and 10 x with 2 step march and down Supine hamstrings stretch with other leg straight 4 x 20 seconds Figure 4 stretch with push 4 x 20 seconds Prone alternating hip extension 2 sets of 10 x 3 seconds Hip hike 10 x 3 seconds  02464: Reviewed objective measures, updated home exercises and discussed the importance of continued strength work for both quadriceps and hamstrings to meet all long-term goals   PATIENT EDUCATION:  Education details: exam findings, POC, HEP  Person educated: Patient Education method: Programmer, multimedia, Facilities manager, and Handouts Education comprehension: verbalized understanding, returned demonstration, and needs further education  HOME EXERCISE PROGRAM: Access Code: [email or web address] URL: [email or web address] Date: 07/12/2023 Prepared by: Lamar Ivory  Exercises - Supine Bridge with Resistance Band  - 1 x daily - 1-7 x weekly - 1 sets - 10 reps - 5 second  hold - Clam with Resistance  - 1-2 x daily - 1-7 x weekly - 1-2 sets - 10 reps - 5 seconds hold - Standing Quad Stretch with Towel and Arm Support  - 1-2 x daily - 1-7 x weekly - 1 sets - 3 reps -  3 deep breaths hold - Supine Figure 4 Piriformis Stretch  - 1 x daily - 3-7 x weekly - 1 sets - 4 reps - 15 seconds hold - Supine Hamstring Stretch  - 2 x daily - 3-7 x weekly - 1 sets - 5 reps - 20 seconds hold - Hip Adductors and Hamstring Stretch with Strap  - 1 x daily - 3-7 x weekly - 2 sets - 30 sec hold - Standing 'L' Stretch at [employer]  - 1 x daily - 1-7 x weekly - 2 sets - 30 sec hold -  Sidelying Reverse Clamshell  - 1 x daily - 1-7 x weekly - 2 sets - 10 reps - Sidelying Hip Circles  - 1 x daily - 1-7 x weekly - 10 reps - Standing Hip Hiking  - 3 x daily - 1-7 x weekly - 1 sets - 10 reps - 3 seconds hold - Prone Knee Flexion  - 1 x daily - 7 x weekly - 2 sets - 10 reps - 5 seconds hold - Prone Hip Internal and External Rotation AROM  - 1 x daily - 7 x weekly - 2 sets - 10 reps - 5 seconds hold - Prone Hip Extension with Bent Knee  - 1 x daily - 7 x weekly - 2 sets - 10 reps - 3 seconds hold - Prone Hip Extension  - 2 x daily - 7 x weekly - 2 sets - 10 reps - 3 seconds hold - Small Range Straight Leg Raise  - 2 x daily - 7 x weekly - 3-5 sets - 5 reps - 3 seconds hold  ASSESSMENT:  CLINICAL IMPRESSION: Jenkins reports overall progress since starting physical therapy.  She can do more, however she is still limited with walking and with moving her right leg from gas to brake secondary to groin/hip flexor/quadriceps discomfort.  We added and progressed her quadriceps and hamstrings strengthening today and reviewed her home exercises to make it a little bit more manageable as she now has 15 activities at home.  We focused on 5 exercises in particular with reading appropriate for 3 times a week participation while the other 7 that were listed as optional.  OBJECTIVE IMPAIRMENTS: Abnormal gait, decreased activity tolerance, decreased balance, decreased mobility, difficulty walking, decreased ROM, decreased strength, increased fascial restrictions, increased muscle spasms, impaired flexibility, and pain.   ACTIVITY LIMITATIONS: sitting, standing, squatting, stairs, transfers, bed mobility, and locomotion level  PARTICIPATION LIMITATIONS: driving, shopping, community activity, occupation, and yard work  PERSONAL FACTORS: Age, Behavior pattern, Education, Fitness, Past/current experiences, Profession, and Time since onset of injury/illness/exacerbation are also affecting patient's functional  outcome.   REHAB POTENTIAL: Good  CLINICAL DECISION MAKING: Stable/uncomplicated  EVALUATION COMPLEXITY: Low   GOALS: Goals reviewed with patient? Yes  SHORT TERM GOALS: Target date: 06/29/2023   Will be compliant with appropriate progressive HEP  Baseline: Goal status: Met 06/20/2023  2.  B HS flexibility to have returned to baseline  Baseline:  Goal status: Met 07/05/2023  3.  Pain to be no more than 3/10 at worst with functional tasks  Baseline:  Goal status: Partially met 07/12/2023  LONG TERM GOALS: Target date: 07/27/2023   MMT to have improved by one grade in all weak groups  Baseline:  Goal status: Ongoing   07/10/2023  2.  Pain to have resolved in L HS and R quad  Baseline:  Goal status: Partially met 07/12/2023  3.  Will be able to participate in  all desired leisure and sports activities without increase in pain  Baseline:  Goal status: Ongoing    07/12/2023  4.  PSFS to have improved by at least 3 points  Baseline:  Goal status: Ongoing    07/10/2023  5.  Will be able to ambulate community distances and navigate steps as desired without increase in pain R quad and L HS  Baseline:  Goal status:  Ongoing   07/10/2023     PLAN:  PT FREQUENCY: 2x/week  PT DURATION: 8 weeks  PLANNED INTERVENTIONS: 97750- Physical Performance Testing, 97110-Therapeutic exercises, 97530- Therapeutic activity, [id number]- Neuromuscular re-education, 97535- Self Care, 02859- Manual therapy, [id number]- Gait training, [id number]- Aquatic Therapy, [id number]- Vasopneumatic device, Taping, and Dry Needling  PLAN FOR NEXT SESSION: Continue with quadriceps and hamstrings strength progressions.  Continue to update & progress HEP.    Myer LELON Ivory, PT, MPT 07/12/2023, 1:46 PM

## 2023-07-19 ENCOUNTER — Ambulatory Visit: Admitting: Physician Assistant

## 2023-07-19 DIAGNOSIS — S76312A Strain of muscle, fascia and tendon of the posterior muscle group at thigh level, left thigh, initial encounter: Secondary | ICD-10-CM | POA: Diagnosis not present

## 2023-07-19 NOTE — Progress Notes (Signed)
 Office Visit Note   Patient: Helen Swanson           Date of Birth: Jun 07, 1945           MRN: [id number] Visit Date: 07/19/2023              Requested by: Theophilus Andrews, Tully GRADE, MD [address] [place],  [place] [address] PCP: Theophilus Andrews, Tully GRADE, MD      HPI: Patient is a 78 year old woman here to follow-up on a proximal hamstring strain.  She has been doing physical therapy and she says she has some good days but still overall feels like her progress is slow.  Assessment & Plan: Visit Diagnoses:  1. Hamstring strain, left, initial encounter     Plan: I spoke with her today.  She is having a trip to [place].  She is wondering if she can take an occasional anti-inflammatory.  She has had mixed responses from her cardiology whether this is safe.  I have told her to check with them but I think it would help her while traveling.  She should continue PT and her exercises.  We also talked about the possibility of having an ultrasound guided injection into the proximal hamstring insertion.  She does not want to do this right now because steroid injections the past have not helped but she will consider this if needed.  Follow-Up Instructions: Return if symptoms worsen or fail to improve.   Ortho Exam  Patient is alert, oriented, no adenopathy, well-dressed, normal affect, normal respiratory effort. Examination of she still has some mild tenderness but her strength is intact she is neurovascular intact is ambulating with ease    Imaging: No results found. No images are attached to the encounter.  Labs: Lab Results  Component Value Date   HGBA1C 5.5 10/29/2020   HGBA1C 5.7 (H) 11/25/2013   HGBA1C  01/21/2010    5.6 (NOTE)                                                                       According to the ADA Clinical Practice Recommendations for 2011, when HbA1c is used as a screening test:   >=6.5%   Diagnostic of Diabetes Mellitus           (if  abnormal result  is confirmed)  5.7-6.4%   Increased risk of developing Diabetes Mellitus  References:Diagnosis and Classification of Diabetes Mellitus,Diabetes Care,2011,34(Suppl 1):S62-S69 and Standards of Medical Care in         Diabetes - 2011,Diabetes Care,2011,34  (Suppl 1):S11-S61.     Lab Results  Component Value Date   ALBUMIN 4.5 12/13/2022   ALBUMIN 4.7 11/02/2021   ALBUMIN 4.9 10/29/2020    Lab Results  Component Value Date   MG 2.8 (H) 01/26/2010   MG 2.8 (H) 01/26/2010   MG 3.8 (H) 01/25/2010   Lab Results  Component Value Date   VD25OH 62.81 12/13/2022   VD25OH 63.17 11/02/2021   VD25OH 59.12 10/29/2020    No results found for: PREALBUMIN    Latest Ref Rng & Units 12/13/2022    9:17 AM 11/02/2021    9:09 AM 10/29/2020    8:44 AM  CBC EXTENDED  WBC 4.0 -  10.5 K/uL 5.5  4.5  4.9   RBC 3.87 - 5.11 Mil/uL 5.01  5.23  5.08   Hemoglobin 12.0 - 15.0 g/dL 84.8  84.7  84.9   HCT 36.0 - 46.0 % 45.5  45.9  45.3   Platelets 150.0 - 400.0 K/uL 280.0  256.0  249.0   NEUT# 1.4 - 7.7 K/uL 4.0  3.0  3.4   Lymph# 0.7 - 4.0 K/uL 0.9  0.9  0.8      There is no height or weight on file to calculate BMI.  Orders:  No orders of the defined types were placed in this encounter.  No orders of the defined types were placed in this encounter.    Procedures: No procedures performed  Clinical Data: No additional findings.  ROS:  All other systems negative, except as noted in the HPI. Review of Systems  Objective: Vital Signs: LMP 01/02/1989   Specialty Comments:  No specialty comments available.  PMFS History: Patient Active Problem List   Diagnosis Date Noted   Hamstring strain, left, initial encounter 05/18/2023   Right bundle branch block 05/17/2021   Intractable migraine without aura and without status migrainosus 08/20/2014   Benign head tremor 08/20/2014   NSTEMI (non-ST elevated myocardial infarction) (HCC)    Emphysema lung (HCC) 11/26/2013    Stress-induced cardiomyopathy 11/25/2013   Elevated LFTs 11/25/2013   Hyperlipidemia 11/25/2013   History of mitral valve repair 11/20/2012   Heart murmur    Mitral regurgitation    Hypotension    Osteoporosis    Tachycardia    Past Medical History:  Diagnosis Date   Emphysema of lung (HCC)    a. By CXR, nonsmoker.   Heart murmur    Hyperlipidemia    Hypotension    IC (interstitial cystitis)    Migraine    a. New onset 11/2013.   Mitral regurgitation    a. s/p MV repair 2012.   MVP (mitral valve prolapse)    a. s/p MV repair 2012.   Osteopenia    Osteoporosis    Pancreatitis    a. At time of MV repair.   PAT (paroxysmal atrial tachycardia) (HCC)    a. Brief during 11/2013 hospitalization.   Pre-diabetes    Shingles    Tachycardia    Takotsubo cardiomyopathy    a. 11/2013: Stress cardiomyopathy (NICM) - EF 25% by cath, 15% by echo, elevated troponin felt due to this. Normal coronaries by cath 11/25/13.   Transaminitis    Tremor     Family History  Problem Relation Age of Onset   Diabetes Mother    Migraines Mother    CVA Father        mild MI   Hiatal hernia Father    Breast cancer Neg Hx     Past Surgical History:  Procedure Laterality Date   DILATION AND CURETTAGE OF UTERUS     x2   ELBOW SURGERY     right elbow for tendonitis   LEFT HEART CATHETERIZATION WITH CORONARY ANGIOGRAM N/A 11/25/2013   Procedure: LEFT HEART CATHETERIZATION WITH CORONARY ANGIOGRAM;  Surgeon: Victory LELON Claudene DOUGLAS, MD;  Location: [facility] CATH LAB;  Service: Cardiovascular;  Laterality: N/A;   MITRAL VALVE REPAIR  1/12   Dr. Dusty   SHOULDER SURGERY  5/04   right   TOE SURGERY     TOTAL ABDOMINAL HYSTERECTOMY W/ BILATERAL SALPINGOOPHORECTOMY     WRIST SURGERY     left 10/06, right 7/07   Social  History   Occupational History   Not on file  Tobacco Use   Smoking status: Never   Smokeless tobacco: Never  Vaping Use   Vaping status: Never Used  Substance and Sexual Activity    Alcohol use: No   Drug use: No   Sexual activity: Not Currently    Birth control/protection: Surgical    Comment: TAH/BSO

## 2023-07-23 ENCOUNTER — Encounter: Payer: Self-pay | Admitting: Physical Therapy

## 2023-07-23 ENCOUNTER — Ambulatory Visit: Admitting: Physical Therapy

## 2023-07-23 DIAGNOSIS — M6281 Muscle weakness (generalized): Secondary | ICD-10-CM

## 2023-07-23 DIAGNOSIS — M79604 Pain in right leg: Secondary | ICD-10-CM

## 2023-07-23 DIAGNOSIS — R29898 Other symptoms and signs involving the musculoskeletal system: Secondary | ICD-10-CM

## 2023-07-23 DIAGNOSIS — M79605 Pain in left leg: Secondary | ICD-10-CM | POA: Diagnosis not present

## 2023-07-23 NOTE — Therapy (Signed)
 OUTPATIENT PHYSICAL THERAPY LOWER EXTREMITY TREATMENT NOTE   Patient Name: Helen Swanson MRN: [id number] DOB:June 10, 1945, 78 y.o., female Today's Date: 07/23/2023  END OF SESSION:  PT End of Session - 07/23/23 0833     Visit Number 12    Number of Visits 17    Authorization Type BC[employer] MCR    Progress Note Due on Visit 20    PT Start Time 0833    PT Stop Time 0920    PT Time Calculation (min) 47 min    Activity Tolerance Patient tolerated treatment well;No increased pain    Behavior During Therapy WFL for tasks assessed/performed             Past Medical History:  Diagnosis Date   Emphysema of lung (HCC)    a. By CXR, nonsmoker.   Heart murmur    Hyperlipidemia    Hypotension    IC (interstitial cystitis)    Migraine    a. New onset 11/2013.   Mitral regurgitation    a. s/p MV repair 2012.   MVP (mitral valve prolapse)    a. s/p MV repair 2012.   Osteopenia    Osteoporosis    Pancreatitis    a. At time of MV repair.   PAT (paroxysmal atrial tachycardia) (HCC)    a. Brief during 11/2013 hospitalization.   Pre-diabetes    Shingles    Tachycardia    Takotsubo cardiomyopathy    a. 11/2013: Stress cardiomyopathy ([facility]) - EF 25% by cath, 15% by echo, elevated troponin felt due to this. Normal coronaries by cath 11/25/13.   Transaminitis    Tremor    Past Surgical History:  Procedure Laterality Date   DILATION AND CURETTAGE OF UTERUS     x2   ELBOW SURGERY     right elbow for tendonitis   LEFT HEART CATHETERIZATION WITH CORONARY ANGIOGRAM N/A 11/25/2013   Procedure: LEFT HEART CATHETERIZATION WITH CORONARY ANGIOGRAM;  Surgeon: Victory LELON Claudene DOUGLAS, MD;  Location: [facility] CATH LAB;  Service: Cardiovascular;  Laterality: N/A;   MITRAL VALVE REPAIR  1/12   Dr. Dusty   SHOULDER SURGERY  5/04   right   TOE SURGERY     TOTAL ABDOMINAL HYSTERECTOMY W/ BILATERAL SALPINGOOPHORECTOMY     WRIST SURGERY     left 10/06, right 7/07   Patient Active Problem List    Diagnosis Date Noted   Hamstring strain, left, initial encounter 05/18/2023   Right bundle branch block 05/17/2021   Intractable migraine without aura and without status migrainosus 08/20/2014   Benign head tremor 08/20/2014   NSTEMI (non-ST elevated myocardial infarction) (HCC)    Emphysema lung (HCC) 11/26/2013   Stress-induced cardiomyopathy 11/25/2013   Elevated LFTs 11/25/2013   Hyperlipidemia 11/25/2013   History of mitral valve repair 11/20/2012   Heart murmur    Mitral regurgitation    Hypotension    Osteoporosis    Tachycardia     PCP: Theophilus Andrews, Tully MD   REFERRING PROVIDER: Persons, Ronal Dragon, PA  REFERRING DIAG:  Diagnosis  [phone] (ICD-10-CM) - Pain in left thigh    THERAPY DIAG:  Pain in left leg  Pain in right leg  Muscle weakness (generalized)  Other symptoms and signs involving the musculoskeletal system  Rationale for Evaluation and Treatment: Rehabilitation  ONSET DATE: mid-April 2025  SUBJECTIVE:   SUBJECTIVE STATEMENT: The left hamstring seems better.  The right quad is same mainly driving moving foot gas <> brake.  The getting in/out of  car is better.    PERTINENT HISTORY: See above    PAIN:  Are you having pain? Yes: NPRS scale:  see below for last week  Pain location: L hamstrings 0-2/10  and right quadriceps 0-3/10 Pain description: Aching, discomfort  Aggravating factors: left hamstring with walking and right quad with walking and car transfers and gas to brake, extended sitting sore then spikes when she gets up;  walking esp when reaching LLE forward, stairs, squatting     Right quad more vastus medialus & rectus: driving moving gas to brake.  Getting into high bed lifting RLE Relieving factors: Straightening the leg  PRECAUTIONS: None  RED FLAGS: None   WEIGHT BEARING RESTRICTIONS: No  FALLS:  Has patient fallen in last 6 months? No  LIVING ENVIRONMENT: Lives with: lives alone Lives in: House/apartment Stairs:  No Has following equipment at home: None  OCCUPATION: retired- used to Associate Professor, also worked as a travel agency   PLOF: Independent, Independent with basic ADLs, Independent with gait, and Independent with transfers  PATIENT GOALS: address pain, be able to walk without hurting   NEXT MD VISIT:  Referring June 17th 2025  OBJECTIVE:  Note: Objective measures were completed at Evaluation unless otherwise noted.  DIAGNOSTIC FINDINGS:   Radiographs of her pelvis no acute fracture no significant degenerative  changes  PATIENT SURVEYS:    Patient-Specific Activity Scoring Scheme  0 represents "unable to perform." 10 represents "able to perform at prior level. 0 1 2 3 4 5 6 7 8 9  10 (Date and Score)   Activity Eval  06/29/23   1. Walking   8  8  2. Steps   8 8   3. Sitting  8 8  4.Bending over  8 8  5.    Score 8 I can do everything but it just hurts  8   Total score = sum of the activity scores/number of activities Minimum detectable change (90%CI) for average score = 2 points Minimum detectable change (90%CI) for single activity score = 3 points  COGNITION: Overall cognitive status: Within functional limits for tasks assessed    MUSCLE LENGTH: Hip flexors WNL B Quads mod limitation B HS severe limitation B; 06/08/23-  moderate limitation B but very guarded; 06/29/23 mod limitation B   Piriformis WNL B   PALPATION: Tender at ischial tub L hip, otherwise HS not tender to palpation and no mm spasms noted, R anterior quad not tender to palpation but does relay some pain with active motions   Lumbar AROM (Lt/Rt in degrees): Extension 10 degrees; Lateral bending 30/40 degrees  Flexibility (Lt/Rt in degrees):   06/20/2023  07/05/2023 Hip flexion 95/95   105/105 Hamstrings 40/35   45/45 Hip ER  30/23   43/41 Hip IR  10/8   12/3  LOWER EXTREMITY MMT:  MMT Right eval Left eval Right  06/29/23 Left  06/29/23 Left 07/10/23 Right 07/10/23  Hip flexion 3- limited by  chronic R quad pain  3- limited by HS pain  3- 3+    Hip extension 3 2+      Hip abduction 3+ 3- HS pain  3+ 3+    Hip adduction        Hip internal rotation        Hip external rotation        Knee flexion 3+ at best 3 at best painful  4 3+ at best painful  Seated  HH dynameter 5.6#, 4.6# LLE:RLE 49% Seated  HH dynameter 10.7#, 9.9#  Knee extension 4+ 4 4 4+ HH dynamter 27.3#,  28.2# HH dynameter 23#, 23.5# RLE:LLE 84%  Ankle dorsiflexion        Ankle plantarflexion        Ankle inversion        Ankle eversion         (Blank rows = not tested)   FUNCTIONAL TESTS:  5 times sit to stand: 18.7 seconds no UEs, difficulty with eccentric control and some posterior unsteadiness noted    TODAY'S TREATMENT NOTE  07/23/2023: Therapeutic Exercise: Recumbent bike: level 3 x 8 minutes - foot strap snug to engage both quads & hamstrings. Adductor squeeze with ball 5 sec hold 10 reps  Added to HEP with pt verbalizing understanding.  Adductor longus stretch standing 30 sec hold 3 reps BLEs  Added to HEP with pt verbalizing understanding.   Therapeutic Activities: Leg press BLEs 75# 15 reps with PT cues on technique.  Single leg 43# 10 reps 2 sets with ea LE. Blaze pod simulated driving pressing on BOSU to light change - 30 sec 3 reps  PT palpated painful area is proximal adductor longus muscle more than quads Adductor slide (foot on pillow case) red theraband 10 reps 2 sets with PT cues on not allowing hip rotation.  Added to HEP with pt verbalizing understanding.  Stepping over 6 hurdle forward facing alternating LEs with RUE support on sink focus on not substituting with hip rotation. 10 reps.  Pt verbalized minimal to no pain in right adductor / quad area.   Side stepping BLEs over 6 hurdle both right & left with RUE support on sink focus on not substituting with hip rotation. 10 reps.  Pt verbalized minimal to no pain in right adductor / quad area.      07/12/2023 NuStep Level 6 for  8 minutes legs only Prone hamstrings curls with Red Thera-Band 10 x 3 seconds left only slow eccentrics Prone alternating hip extension 2 sets of 10 for 3 seconds Seated straight leg raises 3 sets of 5 for 3 seconds  02464: Extensive review of home exercise program with particular emphasis on 5 exercises, 3 exercises to be completed 3 times a week and review of other exercises to be done as needed   07/10/2023: Therapeutic Exercise:  Nustep: level 6 x 8 minutes - 5 min with BLEs / BUEs & 3 min BLEs only.  Bridge with red band resistance 10 reps 5 sec.  PT cues on technique. Clam shell with red band resistance 10 reps on ea LE.   PT cues on technique. Left hamstring stretch 30 sec hold first 3 reps thigh vertical with knee extending;  2 reps with strap SLR & abducted position;  2 reps with strap SLR & adducted position;   Prone left hamstring: 5 reps straight knee flexion; then 10 reps knee flexion with controlled IR / ER.  Hip flexor stretch: supine leg over side of mat table Seated rt LAQ with red band resistance 10 reps See objective data for [facility] dynameter strength testing.     07/05/2023 Yoga Bridge 10 x 5 seconds; 5 x 10 seconds and 10 x with 2 step march and down Supine hamstrings stretch with other leg straight 4 x 20 seconds Figure 4 stretch with push 4 x 20 seconds Prone alternating hip extension 2 sets of 10 x 3 seconds Hip hike 10 x 3 seconds  02464: Reviewed objective measures, updated home exercises and discussed the  importance of continued strength work for both quadriceps and hamstrings to meet all long-term goals   PATIENT EDUCATION:  Education details: exam findings, POC, HEP  Person educated: Patient Education method: Programmer, multimedia, Demonstration, and Handouts Education comprehension: verbalized understanding, returned demonstration, and needs further education  HOME EXERCISE PROGRAM: Access Code: [email or web address] URL: [email or web address] Date:  07/23/2023 Prepared by: Grayce Spatz  Exercises - Supine Bridge with Resistance Band  - 1 x daily - 1-7 x weekly - 1 sets - 10 reps - 5 second  hold - Clam with Resistance  - 1-2 x daily - 1-7 x weekly - 1-2 sets - 10 reps - 5 seconds hold - Standing Quad Stretch with Towel and Arm Support  - 1-2 x daily - 1-7 x weekly - 1 sets - 3 reps - 3 deep breaths hold - Supine Figure 4 Piriformis Stretch  - 1 x daily - 3-7 x weekly - 1 sets - 4 reps - 15 seconds hold - Supine Hamstring Stretch  - 2 x daily - 3-7 x weekly - 1 sets - 5 reps - 20 seconds hold - Hip Adductors and Hamstring Stretch with Strap  - 1 x daily - 3-7 x weekly - 2 sets - 30 sec hold - Standing 'L' Stretch at [employer]  - 1 x daily - 1-7 x weekly - 2 sets - 30 sec hold - Sidelying Reverse Clamshell  - 1 x daily - 1-7 x weekly - 2 sets - 10 reps - Sidelying Hip Circles  - 1 x daily - 1-7 x weekly - 10 reps - Standing Hip Hiking  - 3 x daily - 1-7 x weekly - 1 sets - 10 reps - 3 seconds hold - Prone Knee Flexion  - 1 x daily - 7 x weekly - 2 sets - 10 reps - 5 seconds hold - Prone Hip Internal and External Rotation AROM  - 1 x daily - 7 x weekly - 2 sets - 10 reps - 5 seconds hold - Prone Hip Extension with Bent Knee  - 1 x daily - 7 x weekly - 2 sets - 10 reps - 3 seconds hold - Prone Hip Extension  - 2 x daily - 7 x weekly - 2 sets - 10 reps - 3 seconds hold - Small Range Straight Leg Raise  - 2 x daily - 7 x weekly - 3-5 sets - 5 reps - 3 seconds hold - Lateral Lunge Adductor Stretch with Counter Support  - 1 x daily - 7 x weekly - 1 sets - 3 reps - 30 seconds hold - Seated Hip Adduction Isometrics with Ball  - 1 x daily - 7 x weekly - 2 sets - 10 reps - 5 seconds hold  ASSESSMENT:  CLINICAL IMPRESSION: PT simulated gas <> brake motion and pt pinpointed area of pain which was more proximal adductor longus.  PT instructed in exercises to limit hip rotation to substitute for RLE movements. This seemed to improve her pain.     OBJECTIVE IMPAIRMENTS: Abnormal gait, decreased activity tolerance, decreased balance, decreased mobility, difficulty walking, decreased ROM, decreased strength, increased fascial restrictions, increased muscle spasms, impaired flexibility, and pain.   ACTIVITY LIMITATIONS: sitting, standing, squatting, stairs, transfers, bed mobility, and locomotion level  PARTICIPATION LIMITATIONS: driving, shopping, community activity, occupation, and yard work  PERSONAL FACTORS: Age, Behavior pattern, Education, Fitness, Past/current experiences, Profession, and Time since onset of injury/illness/exacerbation are also affecting patient's functional outcome.   REHAB POTENTIAL: Good  CLINICAL  DECISION MAKING: Stable/uncomplicated  EVALUATION COMPLEXITY: Low   GOALS: Goals reviewed with patient? Yes  SHORT TERM GOALS: Target date: 06/29/2023   Will be compliant with appropriate progressive HEP  Baseline: Goal status: Met 06/20/2023  2.  B HS flexibility to have returned to baseline  Baseline:  Goal status: Met 07/05/2023  3.  Pain to be no more than 3/10 at worst with functional tasks  Baseline:  Goal status: Partially met 07/12/2023  LONG TERM GOALS: Target date: 07/27/2023   MMT to have improved by one grade in all weak groups  Baseline:  Goal status: Ongoing     07/23/2023  2.  Pain to have resolved in L HS and R quad  Baseline:  Goal status: Partially met 07/12/2023  3.  Will be able to participate in all desired leisure and sports activities without increase in pain  Baseline:  Goal status: Ongoing      07/23/2023  4.  PSFS to have improved by at least 3 points  Baseline:  Goal status: Ongoing     07/23/2023  5.  Will be able to ambulate community distances and navigate steps as desired without increase in pain R quad and L HS  Baseline:  Goal status:  Ongoing   07/23/2023     PLAN:  PT FREQUENCY: 2x/week  PT DURATION: 8 weeks  PLANNED INTERVENTIONS: 97750- Physical  Performance Testing, 97110-Therapeutic exercises, 97530- Therapeutic activity, [id number]- Neuromuscular re-education, 97535- Self Care, [id number]- Manual therapy, [id number]- Gait training, [id number]- Aquatic Therapy, [id number]- Vasopneumatic device, Taping, and Dry Needling  PLAN FOR NEXT SESSION: check updated HEP.  Work on functional activities without substituting rotation for weakness.   Continue with quadriceps and hamstrings strength progressions.  Continue to update & progress HEP.    Grayce Spatz, PT, DPT 07/23/2023, 9:33 AM

## 2023-07-26 ENCOUNTER — Encounter: Payer: Self-pay | Admitting: Physical Therapy

## 2023-07-26 ENCOUNTER — Ambulatory Visit: Admitting: Physical Therapy

## 2023-07-26 DIAGNOSIS — M6281 Muscle weakness (generalized): Secondary | ICD-10-CM

## 2023-07-26 DIAGNOSIS — M79605 Pain in left leg: Secondary | ICD-10-CM | POA: Diagnosis not present

## 2023-07-26 DIAGNOSIS — M79604 Pain in right leg: Secondary | ICD-10-CM | POA: Diagnosis not present

## 2023-07-26 DIAGNOSIS — R29898 Other symptoms and signs involving the musculoskeletal system: Secondary | ICD-10-CM

## 2023-07-26 NOTE — Therapy (Signed)
 OUTPATIENT PHYSICAL THERAPY LOWER EXTREMITY TREATMENT NOTE & DISCHARGE SUMMARY   Patient Name: Helen Swanson MRN: [id number] DOB:[birth year], 78 y.o., female Today's Date: 07/26/2023  PHYSICAL THERAPY DISCHARGE SUMMARY  Visits from Start of Care: 13  Current functional level related to goals / functional outcomes: See below   Remaining deficits: See below   Education / Equipment: Patient was educated in well-rounded HEP which she appears to understand.    Patient agrees to discharge. Patient goals were met. Patient is being discharged due to meeting the stated rehab goals.   END OF SESSION:  PT End of Session - 07/26/23 0849     Visit Number 13    Number of Visits 17    Authorization Type [employer] MCR    Progress Note Due on Visit 20    PT Start Time 0845    PT Stop Time 0908    PT Time Calculation (min) 23 min    Activity Tolerance Patient tolerated treatment well;No increased pain    Behavior During Therapy WFL for tasks assessed/performed              Past Medical History:  Diagnosis Date   Emphysema of lung (HCC)    a. By CXR, nonsmoker.   Heart murmur    Hyperlipidemia    Hypotension    IC (interstitial cystitis)    Migraine    a. New onset 11/2013.   Mitral regurgitation    a. s/p MV repair 2012.   MVP (mitral valve prolapse)    a. s/p MV repair 2012.   Osteopenia    Osteoporosis    Pancreatitis    a. At time of MV repair.   PAT (paroxysmal atrial tachycardia) (HCC)    a. Brief during 11/2013 hospitalization.   Pre-diabetes    Shingles    Tachycardia    Takotsubo cardiomyopathy    a. 11/2013: Stress cardiomyopathy (NICM) - EF 25% by cath, 15% by echo, elevated troponin felt due to this. Normal coronaries by cath 11/25/13.   Transaminitis    Tremor    Past Surgical History:  Procedure Laterality Date   DILATION AND CURETTAGE OF UTERUS     x2   ELBOW SURGERY     right elbow for tendonitis   LEFT HEART CATHETERIZATION WITH CORONARY  ANGIOGRAM N/A 11/25/2013   Procedure: LEFT HEART CATHETERIZATION WITH CORONARY ANGIOGRAM;  Surgeon: Victory LELON Claudene DOUGLAS, MD;  Location: [facility] CATH LAB;  Service: Cardiovascular;  Laterality: N/A;   MITRAL VALVE REPAIR  1/12   Dr. Dusty   SHOULDER SURGERY  5/04   right   TOE SURGERY     TOTAL ABDOMINAL HYSTERECTOMY W/ BILATERAL SALPINGOOPHORECTOMY     WRIST SURGERY     left 10/06, right 7/07   Patient Active Problem List   Diagnosis Date Noted   Hamstring strain, left, initial encounter 05/18/2023   Right bundle branch block 05/17/2021   Intractable migraine without aura and without status migrainosus 08/20/2014   Benign head tremor 08/20/2014   NSTEMI (non-ST elevated myocardial infarction) (HCC)    Emphysema lung (HCC) 11/26/2013   Stress-induced cardiomyopathy 11/25/2013   Elevated LFTs 11/25/2013   Hyperlipidemia 11/25/2013   History of mitral valve repair 11/20/2012   Heart murmur    Mitral regurgitation    Hypotension    Osteoporosis    Tachycardia     PCP: Theophilus Delma Krabbe MD   REFERRING PROVIDER: Persons, Ronal Dragon, PA  REFERRING DIAG:  Diagnosis  [id number] (ICD-10-CM) -  Pain in left thigh    THERAPY DIAG:  Pain in left leg  Pain in right leg  Muscle weakness (generalized)  Other symptoms and signs involving the musculoskeletal system  Rationale for Evaluation and Treatment: Rehabilitation  ONSET DATE: mid-April 2025  SUBJECTIVE:   SUBJECTIVE STATEMENT: The hamstring is almost no issue including walking.  She can feel it some going up hills.  She has not driven since last PT until this morning which is what is getting the quad  PERTINENT HISTORY: See above    PAIN:  Are you having pain? Yes: NPRS scale:  see below for last week  Pain location: L hamstrings   0-1/10  and right quadriceps 0/10 except driving 6/89 Pain description: Aching, discomfort  Aggravating factors: left hamstring with walking and right quad with walking and car transfers  and gas to brake, extended sitting sore then spikes when she gets up;  walking esp when reaching LLE forward, stairs, squatting     Right quad more vastus medialus & rectus: driving moving gas to brake.  Getting into high bed lifting RLE Relieving factors: Straightening the leg  PRECAUTIONS: None  RED FLAGS: None   WEIGHT BEARING RESTRICTIONS: No  FALLS:  Has patient fallen in last 6 months? No  LIVING ENVIRONMENT: Lives with: lives alone Lives in: House/apartment Stairs: No Has following equipment at home: None  OCCUPATION: retired- used to Associate Professor, also worked as a travel agency   PLOF: Independent, Independent with basic ADLs, Independent with gait, and Independent with transfers  PATIENT GOALS: address pain, be able to walk without hurting   NEXT MD VISIT:  Referring June 17th 2025  OBJECTIVE:  Note: Objective measures were completed at Evaluation unless otherwise noted.  DIAGNOSTIC FINDINGS:   Radiographs of her pelvis no acute fracture no significant degenerative  changes  PATIENT SURVEYS:    Patient-Specific Activity Scoring Scheme  0 represents "unable to perform." 10 represents "able to perform at prior level. 0 1 2 3 4 5 6 7 8 9  10 (Date and Score)   Activity Eval  06/29/23  07/26/23  1. Walking   8  8 9   2. Steps   8 8  9   3. Sitting  8 8 10   4.Bending over  8 8 9   5.     Score 8 I can do everything but it just hurts  8 9.25   Total score = sum of the activity scores/number of activities Minimum detectable change (90%CI) for average score = 2 points Minimum detectable change (90%CI) for single activity score = 3 points  COGNITION: Overall cognitive status: Within functional limits for tasks assessed    MUSCLE LENGTH: Hip flexors WNL B Quads mod limitation B HS severe limitation B; 06/08/23-  moderate limitation B but very guarded; 06/29/23 mod limitation B   Piriformis WNL B   PALPATION: Tender at ischial tub L hip, otherwise HS not  tender to palpation and no mm spasms noted, R anterior quad not tender to palpation but does relay some pain with active motions   Lumbar AROM (Lt/Rt in degrees): Extension 10 degrees; Lateral bending 30/40 degrees  Flexibility (Lt/Rt in degrees):   06/20/2023  07/05/2023 Hip flexion 95/95   105/105 Hamstrings 40/35   45/45 Hip ER  30/23   43/41 Hip IR  10/8   12/3  LOWER EXTREMITY MMT:  MMT Right eval Left eval Right  06/29/23 Left  06/29/23 Left 07/10/23 Right 07/10/23 Left 07/26/23 Right 07/26/23  Hip  flexion 3- limited by chronic R quad pain  3- limited by HS pain  3- 3+      Hip extension 3 2+        Hip abduction 3+ 3- HS pain  3+ 3+      Hip adduction          Hip internal rotation          Hip external rotation          Knee flexion 3+ at best 3 at best painful  4 3+ at best painful  Seated  HH dynameter 5.6#, 4.6# LLE:RLE 49% Seated  HH dynameter 10.7#, 9.9# 5/5 5/5  Knee extension 4+ 4 4 4+ HH dynamter 27.3#,  28.2# HH dynameter 23#, 23.5# RLE:LLE 84% 5/5 5/5  Ankle dorsiflexion          Ankle plantarflexion          Ankle inversion          Ankle eversion           (Blank rows = not tested)   FUNCTIONAL TESTS:  07/26/2023: Patient negotiated flight of stairs (11) with single rail 1 rep on right side and second rep rail on left side alternating pattern modified independent.  She was able to negotiate 4 steps with a step to pattern without a rail modified independent Patient ambulates without an assistive device with normal gait mechanics.  She no longer has an antalgic pattern.  Eval: 5 times sit to stand: 18.7 seconds no UEs, difficulty with eccentric control and some posterior unsteadiness noted    TODAY'S TREATMENT NOTE  07/26/2023: Therapeutic Exercise: Recumbent bike:seat 2 (PT educated on seat adjustment for her leg length, pt verbalized understanding) level 3 x 8 minutes - foot strap snug to engage both quads & hamstrings. PT reviewed HEP with patient  including need to address for key components of a well-rounded program that includes flexibility, strength, balance and muscular endurance.  Patient verbalized understanding. See objective data   Therapeutic Activities: Patient negotiated flight of stairs (11) with single rail 1 rep on right side and second rep rail on left side alternating pattern modified independent.  She was able to negotiate 4 steps with a step to pattern without a rail modified independent PT recommending weight shifting side-to-side upon arising prior to initiating gait.  Patient had reported that sometimes she has some stiffness if she has been sitting more than about 30 minutes that makes initiating gait difficult.  Patient reported that the weight shift activity improved this issue.    07/23/2023: Therapeutic Exercise: Recumbent bike: level 3 x 8 minutes - foot strap snug to engage both quads & hamstrings. Adductor squeeze with ball 5 sec hold 10 reps  Added to HEP with pt verbalizing understanding.  Adductor longus stretch standing 30 sec hold 3 reps BLEs  Added to HEP with pt verbalizing understanding.   Therapeutic Activities: Leg press BLEs 75# 15 reps with PT cues on technique.  Single leg 43# 10 reps 2 sets with ea LE. Blaze pod simulated driving pressing on BOSU to light change - 30 sec 3 reps  PT palpated painful area is proximal adductor longus muscle more than quads Adductor slide (foot on pillow case) red theraband 10 reps 2 sets with PT cues on not allowing hip rotation.  Added to HEP with pt verbalizing understanding.  Stepping over 6 hurdle forward facing alternating LEs with RUE support on sink focus on not substituting with hip rotation.  10 reps.  Pt verbalized minimal to no pain in right adductor / quad area.   Side stepping BLEs over 6 hurdle both right & left with RUE support on sink focus on not substituting with hip rotation. 10 reps.  Pt verbalized minimal to no pain in right adductor / quad area.       07/12/2023 NuStep Level 6 for 8 minutes legs only Prone hamstrings curls with Red Thera-Band 10 x 3 seconds left only slow eccentrics Prone alternating hip extension 2 sets of 10 for 3 seconds Seated straight leg raises 3 sets of 5 for 3 seconds  02464: Extensive review of home exercise program with particular emphasis on 5 exercises, 3 exercises to be completed 3 times a week and review of other exercises to be done as needed   07/10/2023: Therapeutic Exercise:  Nustep: level 6 x 8 minutes - 5 min with BLEs / BUEs & 3 min BLEs only.  Bridge with red band resistance 10 reps 5 sec.  PT cues on technique. Clam shell with red band resistance 10 reps on ea LE.   PT cues on technique. Left hamstring stretch 30 sec hold first 3 reps thigh vertical with knee extending;  2 reps with strap SLR & abducted position;  2 reps with strap SLR & adducted position;   Prone left hamstring: 5 reps straight knee flexion; then 10 reps knee flexion with controlled IR / ER.  Hip flexor stretch: supine leg over side of mat table Seated rt LAQ with red band resistance 10 reps See objective data for [facility] dynameter strength testing.    PATIENT EDUCATION:  Education details: exam findings, POC, HEP  Person educated: Patient Education method: Explanation, Demonstration, and Handouts Education comprehension: verbalized understanding, returned demonstration, and needs further education  HOME EXERCISE PROGRAM: Access Code: [email or web address] URL: [email or web address] Date: 07/23/2023 Prepared by: Grayce Spatz  Exercises - Supine Bridge with Resistance Band  - 1 x daily - 1-7 x weekly - 1 sets - 10 reps - 5 second  hold - Clam with Resistance  - 1-2 x daily - 1-7 x weekly - 1-2 sets - 10 reps - 5 seconds hold - Standing Quad Stretch with Towel and Arm Support  - 1-2 x daily - 1-7 x weekly - 1 sets - 3 reps - 3 deep breaths hold - Supine Figure 4 Piriformis Stretch  - 1 x daily - 3-7 x weekly - 1 sets  - 4 reps - 15 seconds hold - Supine Hamstring Stretch  - 2 x daily - 3-7 x weekly - 1 sets - 5 reps - 20 seconds hold - Hip Adductors and Hamstring Stretch with Strap  - 1 x daily - 3-7 x weekly - 2 sets - 30 sec hold - Standing 'L' Stretch at [employer]  - 1 x daily - 1-7 x weekly - 2 sets - 30 sec hold - Sidelying Reverse Clamshell  - 1 x daily - 1-7 x weekly - 2 sets - 10 reps - Sidelying Hip Circles  - 1 x daily - 1-7 x weekly - 10 reps - Standing Hip Hiking  - 3 x daily - 1-7 x weekly - 1 sets - 10 reps - 3 seconds hold - Prone Knee Flexion  - 1 x daily - 7 x weekly - 2 sets - 10 reps - 5 seconds hold - Prone Hip Internal and External Rotation AROM  - 1 x daily - 7 x weekly - 2 sets - 10 reps -  5 seconds hold - Prone Hip Extension with Bent Knee  - 1 x daily - 7 x weekly - 2 sets - 10 reps - 3 seconds hold - Prone Hip Extension  - 2 x daily - 7 x weekly - 2 sets - 10 reps - 3 seconds hold - Small Range Straight Leg Raise  - 2 x daily - 7 x weekly - 3-5 sets - 5 reps - 3 seconds hold - Lateral Lunge Adductor Stretch with Counter Support  - 1 x daily - 7 x weekly - 1 sets - 3 reps - 30 seconds hold - Seated Hip Adduction Isometrics with Ball  - 1 x daily - 7 x weekly - 2 sets - 10 reps - 5 seconds hold  ASSESSMENT:  CLINICAL IMPRESSION: Patient met all long-term goals.  Patient appears to be functioning in the community without issues.  Patient reports she is pleased with her outcomes from physical therapy.  OBJECTIVE IMPAIRMENTS: Abnormal gait, decreased activity tolerance, decreased balance, decreased mobility, difficulty walking, decreased ROM, decreased strength, increased fascial restrictions, increased muscle spasms, impaired flexibility, and pain.   ACTIVITY LIMITATIONS: sitting, standing, squatting, stairs, transfers, bed mobility, and locomotion level  PARTICIPATION LIMITATIONS: driving, shopping, community activity, occupation, and yard work  PERSONAL FACTORS: Age, Behavior  pattern, Education, Fitness, Past/current experiences, Profession, and Time since onset of injury/illness/exacerbation are also affecting patient's functional outcome.   REHAB POTENTIAL: Good  CLINICAL DECISION MAKING: Stable/uncomplicated  EVALUATION COMPLEXITY: Low   GOALS: Goals reviewed with patient? Yes  SHORT TERM GOALS: Target date: 06/29/2023   Will be compliant with appropriate progressive HEP  Baseline: Goal status: Met 06/20/2023  2.  B HS flexibility to have returned to baseline  Baseline:  Goal status: Met 07/05/2023  3.  Pain to be no more than 3/10 at worst with functional tasks  Baseline:  Goal status: Partially met 07/12/2023  LONG TERM GOALS: Target date: 07/27/2023   MMT to have improved by one grade in all weak groups  Baseline:  Goal status: Met    07/26/2023  2.  Pain to have resolved in L HS and R quad  Baseline:  Goal status: MET 07/26/2023  3.  Will be able to participate in all desired leisure and sports activities without increase in pain  Baseline:  Goal status: MET 07/26/2023  4.  PSFS to have improved by at least 3 points  Baseline:  Goal status: Partially MET 07/26/2023 improved 1.25 but started at 8 of 10 initially.  5.  Will be able to ambulate community distances and navigate steps as desired without increase in pain R quad and L HS  Baseline:  Goal status:  MET 07/26/2023     PLAN:  PT FREQUENCY: 2x/week  PT DURATION: 8 weeks  PLANNED INTERVENTIONS: 97750- Physical Performance Testing, 97110-Therapeutic exercises, 97530- Therapeutic activity, [id number]- Neuromuscular re-education, 97535- Self Care, [id number]- Manual therapy, [id number]- Gait training, [phone]- Aquatic Therapy, [phone]- Vasopneumatic device, Taping, and Dry Needling  PLAN FOR NEXT SESSION: Discharge PT  Grayce Spatz, PT, DPT 07/26/2023, 9:34 AM

## 2023-08-16 DIAGNOSIS — H2511 Age-related nuclear cataract, right eye: Secondary | ICD-10-CM | POA: Diagnosis not present

## 2023-08-16 DIAGNOSIS — H43813 Vitreous degeneration, bilateral: Secondary | ICD-10-CM | POA: Diagnosis not present

## 2023-10-04 DIAGNOSIS — H2511 Age-related nuclear cataract, right eye: Secondary | ICD-10-CM | POA: Diagnosis not present

## 2023-10-17 DIAGNOSIS — H2512 Age-related nuclear cataract, left eye: Secondary | ICD-10-CM | POA: Diagnosis not present

## 2023-10-25 DIAGNOSIS — H2512 Age-related nuclear cataract, left eye: Secondary | ICD-10-CM | POA: Diagnosis not present

## 2023-11-05 ENCOUNTER — Encounter: Payer: Self-pay | Admitting: Radiology

## 2023-12-14 DIAGNOSIS — N390 Urinary tract infection, site not specified: Secondary | ICD-10-CM | POA: Diagnosis not present

## 2023-12-14 DIAGNOSIS — R350 Frequency of micturition: Secondary | ICD-10-CM | POA: Diagnosis not present

## 2023-12-14 DIAGNOSIS — R351 Nocturia: Secondary | ICD-10-CM | POA: Diagnosis not present

## 2023-12-17 ENCOUNTER — Ambulatory Visit: Payer: Medicare Other | Admitting: Internal Medicine

## 2023-12-17 ENCOUNTER — Encounter: Payer: Self-pay | Admitting: Internal Medicine

## 2023-12-17 VITALS — BP 120/80 | HR 73 | Temp 97.8°F | Ht 60.5 in | Wt 105.8 lb

## 2023-12-17 DIAGNOSIS — Z0000 Encounter for general adult medical examination without abnormal findings: Secondary | ICD-10-CM

## 2023-12-17 DIAGNOSIS — I959 Hypotension, unspecified: Secondary | ICD-10-CM

## 2023-12-17 DIAGNOSIS — Z1211 Encounter for screening for malignant neoplasm of colon: Secondary | ICD-10-CM

## 2023-12-17 DIAGNOSIS — E782 Mixed hyperlipidemia: Secondary | ICD-10-CM

## 2023-12-17 DIAGNOSIS — M810 Age-related osteoporosis without current pathological fracture: Secondary | ICD-10-CM

## 2023-12-17 LAB — VITAMIN B12: Vitamin B-12: 723 pg/mL (ref 211–911)

## 2023-12-17 LAB — CBC WITH DIFFERENTIAL/PLATELET
Basophils Absolute: 0 K/uL (ref 0.0–0.1)
Basophils Relative: 0.9 % (ref 0.0–3.0)
Eosinophils Absolute: 0.1 K/uL (ref 0.0–0.7)
Eosinophils Relative: 3.4 % (ref 0.0–5.0)
HCT: 44.5 % (ref 36.0–46.0)
Hemoglobin: 14.9 g/dL (ref 12.0–15.0)
Lymphocytes Relative: 19.8 % (ref 12.0–46.0)
Lymphs Abs: 0.8 K/uL (ref 0.7–4.0)
MCHC: 33.5 g/dL (ref 30.0–36.0)
MCV: 87.9 fl (ref 78.0–100.0)
Monocytes Absolute: 0.4 K/uL (ref 0.1–1.0)
Monocytes Relative: 10.1 % (ref 3.0–12.0)
Neutro Abs: 2.7 K/uL (ref 1.4–7.7)
Neutrophils Relative %: 65.8 % (ref 43.0–77.0)
Platelets: 270 K/uL (ref 150.0–400.0)
RBC: 5.06 Mil/uL (ref 3.87–5.11)
RDW: 14 % (ref 11.5–15.5)
WBC: 4.2 K/uL (ref 4.0–10.5)

## 2023-12-17 LAB — VITAMIN D 25 HYDROXY (VIT D DEFICIENCY, FRACTURES): VITD: 62.05 ng/mL (ref 30.00–100.00)

## 2023-12-17 LAB — COMPREHENSIVE METABOLIC PANEL WITH GFR
ALT: 31 U/L (ref 0–35)
AST: 29 U/L (ref 0–37)
Albumin: 4.7 g/dL (ref 3.5–5.2)
Alkaline Phosphatase: 99 U/L (ref 39–117)
BUN: 20 mg/dL (ref 6–23)
CO2: 28 meq/L (ref 19–32)
Calcium: 11.2 mg/dL — ABNORMAL HIGH (ref 8.4–10.5)
Chloride: 103 meq/L (ref 96–112)
Creatinine, Ser: 1 mg/dL (ref 0.40–1.20)
GFR: 54.9 mL/min — ABNORMAL LOW (ref >60.00)
Glucose, Bld: 99 mg/dL (ref 70–99)
Potassium: 5.3 meq/L — ABNORMAL HIGH (ref 3.5–5.1)
Sodium: 141 meq/L (ref 135–145)
Total Bilirubin: 0.4 mg/dL (ref 0.2–1.2)
Total Protein: 7.2 g/dL (ref 6.0–8.3)

## 2023-12-17 LAB — LIPID PANEL
Cholesterol: 190 mg/dL (ref 0–200)
HDL: 55.6 mg/dL (ref >39.00)
LDL Cholesterol: 100 mg/dL — ABNORMAL HIGH (ref 0–99)
NonHDL: 133.92
Total CHOL/HDL Ratio: 3
Triglycerides: 170 mg/dL — ABNORMAL HIGH (ref 0.0–149.0)
VLDL: 34 mg/dL (ref 0.0–40.0)

## 2023-12-17 LAB — TSH: TSH: 3.62 u[IU]/mL (ref 0.35–5.50)

## 2023-12-17 NOTE — Progress Notes (Signed)
 Established Patient Office Visit     CC/Reason for Visit: Annual preventive exam and subsequent Medicare wellness visit  HPI: Helen Swanson is a 78 y.o. female who is coming in today for the above mentioned reasons. Past Medical History is significant for: Hypertension, hyperlipidemia, benign essential tremor.  Feeling well, no concerns or complaints.  Immunizations are up-to-date.  She is due for Cologuard and bone density.   Past Medical/Surgical History: Past Medical History:  Diagnosis Date   Emphysema of lung (HCC)    a. By CXR, nonsmoker.   Heart murmur    Hyperlipidemia    Hypotension    IC (interstitial cystitis)    Migraine    a. New onset 11/2013.   Mitral regurgitation    a. s/p MV repair 2012.   MVP (mitral valve prolapse)    a. s/p MV repair 2012.   Osteopenia    Osteoporosis    Pancreatitis    a. At time of MV repair.   PAT (paroxysmal atrial tachycardia)    a. Brief during 11/2013 hospitalization.   Pre-diabetes    Shingles    Tachycardia    Takotsubo cardiomyopathy    a. 11/2013: Stress cardiomyopathy ([facility]) - EF 25% by cath, 15% by echo, elevated troponin felt due to this. Normal coronaries by cath 11/25/13.   Transaminitis    Tremor     Past Surgical History:  Procedure Laterality Date   DILATION AND CURETTAGE OF UTERUS     x2   ELBOW SURGERY     right elbow for tendonitis   LEFT HEART CATHETERIZATION WITH CORONARY ANGIOGRAM N/A 11/25/2013   Procedure: LEFT HEART CATHETERIZATION WITH CORONARY ANGIOGRAM;  Surgeon: Victory LELON Claudene DOUGLAS, MD;  Location: [facility] CATH LAB;  Service: Cardiovascular;  Laterality: N/A;   MITRAL VALVE REPAIR  1/12   Dr. Dusty   SHOULDER SURGERY  5/04   right   TOE SURGERY     TOTAL ABDOMINAL HYSTERECTOMY W/ BILATERAL SALPINGOOPHORECTOMY     WRIST SURGERY     left 10/06, right 7/07    Social History:  reports that she has never smoked. She has never used smokeless tobacco. She reports that she does not drink  alcohol and does not use drugs.  Allergies: Allergies[1]  Family History:  Family History  Problem Relation Age of Onset   Diabetes Mother    Migraines Mother    CVA Father        mild MI   Hiatal hernia Father    Breast cancer Neg Hx     Current Medications[2]  Review of Systems:  Negative unless indicated in HPI.   Physical Exam: Vitals:   12/17/23 0911  BP: 120/80  Pulse: 73  Temp: 97.8 F (36.6 C)  TempSrc: Oral  SpO2: 99%  Weight: 105 lb 12.8 oz (48 kg)  Height: 5' 0.5 (1.537 m)    Body mass index is 20.32 kg/m.   Physical Exam Vitals reviewed.  Constitutional:      General: She is not in acute distress.    Appearance: Normal appearance. She is not ill-appearing, toxic-appearing or diaphoretic.  HENT:     Head: Normocephalic.     Right Ear: Tympanic membrane, ear canal and external ear normal. There is no impacted cerumen.     Left Ear: Tympanic membrane, ear canal and external ear normal. There is no impacted cerumen.     Nose: Nose normal.     Mouth/Throat:     Mouth:  Mucous membranes are moist.     Pharynx: Oropharynx is clear. No oropharyngeal exudate or posterior oropharyngeal erythema.  Eyes:     General: No scleral icterus.       Right eye: No discharge.        Left eye: No discharge.     Conjunctiva/sclera: Conjunctivae normal.     Pupils: Pupils are equal, round, and reactive to light.  Neck:     Vascular: No carotid bruit.  Cardiovascular:     Rate and Rhythm: Normal rate and regular rhythm.     Pulses: Normal pulses.     Heart sounds: Normal heart sounds.  Pulmonary:     Effort: Pulmonary effort is normal. No respiratory distress.     Breath sounds: Normal breath sounds.  Abdominal:     General: Abdomen is flat. Bowel sounds are normal.     Palpations: Abdomen is soft.  Musculoskeletal:        General: Normal range of motion.     Cervical back: Normal range of motion.  Skin:    General: Skin is warm and dry.  Neurological:      General: No focal deficit present.     Mental Status: She is alert and oriented to person, place, and time. Mental status is at baseline.  Psychiatric:        Mood and Affect: Mood normal.        Behavior: Behavior normal.        Thought Content: Thought content normal.        Judgment: Judgment normal.    Subsequent Medicare wellness visit   Visit info / Clinical Intake: Medicare Wellness Visit Type:: Subsequent Annual Wellness Visit Persons participating in visit and providing information:: patient Medicare Wellness Visit Mode:: In-person (required for WTM) Interpreter Needed?: No Pre-visit prep was completed: yes AWV questionnaire completed by patient prior to visit?: yes Date:: 12/10/23 Living arrangements:: (!) (Patient-Rptd) lives alone Patient's Overall Health Status Rating: (Patient-Rptd) very good Typical amount of pain: (Patient-Rptd) none Does pain affect daily life?: (Patient-Rptd) no  Dietary Habits and Nutritional Risks How many meals a day?: (Patient-Rptd) 3 Eats fruit and vegetables daily?: (!) (Patient-Rptd) no Most meals are obtained by: (Patient-Rptd) preparing own meals In the last 2 weeks, have you had any of the following?: none Diabetic:: no  Functional Status Activities of Daily Living (to include ambulation/medication): (Patient-Rptd) Independent Ambulation: (Patient-Rptd) Independent Medication Administration: (Patient-Rptd) Independent Home Management (perform basic housework or laundry): (Patient-Rptd) Independent Manage your own finances?: (Patient-Rptd) yes Primary transportation is: (Patient-Rptd) driving Concerns about vision?: no *vision screening is required for WTM* Concerns about hearing?: no  Fall Screening Falls in the past year?: (Patient-Rptd) 0  Fall Risk Patient at Risk for Falls Due to: No Fall Risks Fall risk Follow up: Falls evaluation completed  Home and Transportation Safety: All rugs have non-skid backing?: (!)  (Patient-Rptd) no All stairs or steps have railings?: (Patient-Rptd) N/A, no stairs Grab bars in the bathtub or shower?: (!) (Patient-Rptd) no Have non-skid surface in bathtub or shower?: (Patient-Rptd) yes Good home lighting?: (Patient-Rptd) yes Regular seat belt use?: (Patient-Rptd) yes Hospital stays in the last year:: (Patient-Rptd) no  Cognitive Assessment Difficulty concentrating, remembering, or making decisions? : (Patient-Rptd) no Will 6CIT or Mini Cog be Completed: yes  Advance Directives (For Healthcare) Does Patient Have a Medical Advance Directive?: Yes Does patient want to make changes to medical advance directive?: No - Guardian declined Type of Advance Directive: Living will; Healthcare Power of [phone]  Copy of Healthcare Power of Attorney in Chart?: No - copy requested Copy of Living Will in Chart?: No - copy requested  Reviewed/Updated  Reviewed/Updated: Reviewed All (Medical, Surgical, Family, Medications, Allergies, Care Teams, Patient Goals)    Vision Screening   Right eye Left eye Both eyes  Without correction 20/30 20/30 20/30   With correction         Depression/mood:  Flowsheet Row Office Visit from 12/13/2022 in [facility] [place]  PHQ-9 Total Score 1        Counseling: Counseling given: Not Answered     Lab orders based on risk factors: Laboratory update will be reviewed     Screening: Patient provided with a written and personalized 5-10 year screening schedule in the AVS. Health Maintenance  Topic Date Due   Zoster (Shingles) Vaccine (2 of 2) 09/06/2021   Medicare Annual Wellness Visit  12/17/2023*   COVID-19 Vaccine (7 - 2025-26 season) 05/05/2024   DTaP/Tdap/Td vaccine (2 - Td or Tdap) 07/28/2026   Pneumococcal Vaccine for age over 43  Completed   Flu Shot  Completed   Osteoporosis screening with Bone Density Scan  Completed   Hepatitis C Screening  Completed   Meningitis B Vaccine  Aged Out   Breast  Cancer Screening  Discontinued  *Topic was postponed. The date shown is not the original due date.     Provider List Update: Patient Care Team    Relationship Specialty Notifications Start End  Theophilus Andrews, Tully GRADE, MD PCP - General Internal Medicine  08/21/19   Jeffrie Oneil BROCKS, MD PCP - Cardiology Cardiology Admissions 05/10/18        I have personally reviewed and noted the following in the patients chart:   Medical and social history Use of alcohol, tobacco or illicit drugs  Current medications and supplements Functional ability and status Nutritional status Physical activity Advanced directives List of other physicians Hospitalizations, surgeries, and ER visits in previous 12 months Vitals Screenings to include cognitive, depression, and falls Referrals and appointments  In addition, I have reviewed and discussed with patient certain preventive protocols, quality metrics, and best practice recommendations. A written personalized care plan for preventive services as well as general preventive health recommendations were provided to patient.   Impression and Plan:  Medicare annual wellness visit, subsequent  Hypotension, unspecified hypotension type -     CBC with Differential/Platelet; Future -     Comprehensive metabolic panel with GFR; Future  Mixed hyperlipidemia -     Lipid panel; Future  Osteoporosis, unspecified osteoporosis type, unspecified pathological fracture presence -     TSH; Future -     VITAMIN D  25 Hydroxy (Vit-D Deficiency, Fractures); Future -     Vitamin B12; Future -     DG Bone Density; Future  Screening for colon cancer -     Cologuard   -Recommend routine eye and dental care. -Healthy lifestyle discussed in detail. -Labs to be updated today. -Prostate cancer screening: N/A Health Maintenance  Topic Date Due   Zoster (Shingles) Vaccine (2 of 2) 09/06/2021   Medicare Annual Wellness Visit  12/17/2023*   COVID-19 Vaccine (7 -  2025-26 season) 05/05/2024   DTaP/Tdap/Td vaccine (2 - Td or Tdap) 07/28/2026   Pneumococcal Vaccine for age over 47  Completed   Flu Shot  Completed   Osteoporosis screening with Bone Density Scan  Completed   Hepatitis C Screening  Completed   Meningitis B Vaccine  Aged Out  Breast Cancer Screening  Discontinued  *Topic was postponed. The date shown is not the original due date.        Tully Theophilus Andrews, MD [facility]      [1]  Allergies Allergen Reactions   Codeine Nausea And Vomiting    NAUSEA  [2]  Current Outpatient Medications:    Ascorbic Acid (VITAMIN C PO), Take 1 tablet by mouth daily., Disp: , Rfl:    aspirin  81 MG tablet, Take 81 mg by mouth daily., Disp: , Rfl:    metoprolol  succinate (TOPROL -XL) 25 MG 24 hr tablet, Take 1 tablet (25 mg total) by mouth daily., Disp: 90 tablet, Rfl: 3   Multiple Vitamin (MULTIVITAMIN WITH MINERALS) TABS tablet, Take 1 tablet by mouth daily., Disp: , Rfl:    nitrofurantoin, macrocrystal-monohydrate, (MACROBID) 100 MG capsule, Take 100 mg by mouth every other day. , Disp: , Rfl:    Omega-3 Fatty Acids (FISH OIL) 1200 MG CAPS, Take 1,200 mg by mouth daily., Disp: , Rfl:

## 2023-12-18 ENCOUNTER — Ambulatory Visit: Payer: Self-pay | Admitting: Internal Medicine

## 2023-12-18 DIAGNOSIS — E875 Hyperkalemia: Secondary | ICD-10-CM

## 2023-12-24 ENCOUNTER — Other Ambulatory Visit: Payer: Self-pay | Admitting: Internal Medicine

## 2023-12-24 DIAGNOSIS — Z1231 Encounter for screening mammogram for malignant neoplasm of breast: Secondary | ICD-10-CM

## 2024-01-31 ENCOUNTER — Other Ambulatory Visit (INDEPENDENT_AMBULATORY_CARE_PROVIDER_SITE_OTHER)

## 2024-01-31 DIAGNOSIS — E875 Hyperkalemia: Secondary | ICD-10-CM | POA: Diagnosis not present

## 2024-01-31 LAB — COMPLETE METABOLIC PANEL WITHOUT GFR
AG Ratio: 1.7 (calc) (ref 1.0–2.5)
ALT: 21 U/L (ref 6–29)
AST: 24 U/L (ref 10–35)
Albumin: 4.3 g/dL (ref 3.6–5.1)
Alkaline phosphatase (APISO): 100 U/L (ref 37–153)
BUN: 20 mg/dL (ref 7–25)
CO2: 26 mmol/L (ref 20–32)
Calcium: 9.9 mg/dL (ref 8.6–10.4)
Chloride: 108 mmol/L (ref 98–110)
Creat: 0.88 mg/dL (ref 0.60–1.00)
Globulin: 2.6 g/dL (ref 1.9–3.7)
Glucose, Bld: 114 mg/dL — ABNORMAL HIGH (ref 65–99)
Potassium: 4.6 mmol/L (ref 3.5–5.3)
Sodium: 143 mmol/L (ref 135–146)
Total Bilirubin: 0.5 mg/dL (ref 0.2–1.2)
Total Protein: 6.9 g/dL (ref 6.1–8.1)

## 2024-02-05 ENCOUNTER — Ambulatory Visit: Payer: Self-pay | Admitting: Internal Medicine

## 2024-04-21 ENCOUNTER — Ambulatory Visit

## 2024-05-28 ENCOUNTER — Ambulatory Visit: Admitting: Cardiology

## 2024-12-18 ENCOUNTER — Ambulatory Visit: Admitting: Internal Medicine
